# Patient Record
Sex: Female | Born: 1970
Health system: Southern US, Community
[De-identification: ages and names within clinical notes are randomized; demographics above are authoritative.]

## PROBLEM LIST (undated history)

## (undated) DIAGNOSIS — Z780 Asymptomatic menopausal state: Secondary | ICD-10-CM

## (undated) DIAGNOSIS — G2581 Restless legs syndrome: Secondary | ICD-10-CM

## (undated) DIAGNOSIS — L719 Rosacea, unspecified: Secondary | ICD-10-CM

## (undated) DIAGNOSIS — J4530 Mild persistent asthma, uncomplicated: Secondary | ICD-10-CM

## (undated) DIAGNOSIS — E785 Hyperlipidemia, unspecified: Secondary | ICD-10-CM

## (undated) DIAGNOSIS — G43801 Other migraine, not intractable, with status migrainosus: Secondary | ICD-10-CM

## (undated) DIAGNOSIS — M797 Fibromyalgia: Secondary | ICD-10-CM

## (undated) HISTORY — DX: Restless legs syndrome: G25.81

## (undated) HISTORY — DX: Asymptomatic menopausal state: Z78.0

## (undated) HISTORY — DX: Mild persistent asthma, uncomplicated: J45.30

## (undated) HISTORY — DX: Rosacea, unspecified: L71.9

## (undated) HISTORY — DX: Other migraine, not intractable, with status migrainosus: G43.801

## (undated) HISTORY — DX: Hyperlipidemia, unspecified: E78.5

## (undated) HISTORY — DX: Fibromyalgia: M79.7

## (undated) HISTORY — PX: BARIATRIC SURGERY: SHX1103

## (undated) HISTORY — PX: KNEE SURGERY: SHX244

---
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
## 1998-07-26 HISTORY — PX: GALLBLADDER SURGERY: SHX652

## 2007-07-27 HISTORY — PX: ABDOMINAL HYSTERECTOMY: SHX81

## 2007-07-27 HISTORY — PX: HERNIA REPAIR: SHX51

## 2008-07-26 HISTORY — PX: BREAST LUMPECTOMY: SHX2

## 2010-03-29 ENCOUNTER — Ambulatory Visit: Payer: Self-pay | Admitting: Family Medicine

## 2010-03-29 DIAGNOSIS — E785 Hyperlipidemia, unspecified: Secondary | ICD-10-CM | POA: Insufficient documentation

## 2010-03-29 LAB — CONVERTED CEMR LAB
Bilirubin Urine: NEGATIVE
Glucose, Urine, Semiquant: NEGATIVE
Ketones, urine, test strip: NEGATIVE
Nitrite: NEGATIVE
Specific Gravity, Urine: 1.015

## 2010-03-31 ENCOUNTER — Encounter: Payer: Self-pay | Admitting: Family Medicine

## 2010-04-01 ENCOUNTER — Telehealth (INDEPENDENT_AMBULATORY_CARE_PROVIDER_SITE_OTHER): Payer: Self-pay | Admitting: *Deleted

## 2010-08-25 NOTE — Progress Notes (Signed)
  Phone Note Outgoing Call Call back at [facility] Phone [phone]   Call placed by: Avel Sensor Action Taken: Phone Call Completed Summary of Call: Patient feeling better, finish medicine

## 2010-08-25 NOTE — Assessment & Plan Note (Signed)
Summary: Frequent, painful urination x 3dys rm 3   Vital Signs:  Patient Profile:   40 Years Old Female CC:      Frequent, painful urination x 3dys Height:     60 inches Weight:      185 pounds O2 Sat:      100 % O2 treatment:    Room Air Temp:     99.0 degrees F oral Pulse rate:   101 / minute Pulse rhythm:   irregular Resp:     16 per minute BP sitting:   124 / 82  (left arm) Cuff size:   regular  Vitals Entered By: Areta Haber CMA (March 29, 2010 3:24 PM)                History of Present Illness Chief Complaint: Frequent, painful urination x 3dys History of Present Illness:  Subjective:  Patient presents complaining of UTI symptoms for 3 days.  Complains of dysuria, frequency, nocturia, hematuria,  and urgency.  No abnormal vaginal discharge.  No fever/chills/sweats.  No abdominal pain.  No flank pain.  No nausea/vomiting.  Past history includes hysterectomy and appendectomy.  Current Problems: CYSTITIS, ACUTE (ICD-595.0) FAMILY HISTORY BREAST CANCER 1ST DEGREE RELATIVE <50 (ICD-V16.3) RHEUMATOID ARTHRITIS (ICD-714.0) HYPERLIPIDEMIA (ICD-272.4) DEPRESSION (ICD-311) ANXIETY (ICD-300.00)   Current Meds WELLBUTRIN XL 300 MG XR24H-TAB (BUPROPION HCL) 1 tab by mouth once daily SINGULAIR 10 MG TABS (MONTELUKAST SODIUM) 1 tab by mouth once daily CYMBALTA 60 MG CPEP (DULOXETINE HCL) 1 tab by mouth once daily PRAVACHOL 80 MG TABS (PRAVASTATIN SODIUM) 1 tab by mouth once daily ZYRTEC HIVES RELIEF 10 MG TABS (CETIRIZINE HCL) 1 tab by mouth once daily CEPHALEXIN 500 MG CAPS (CEPHALEXIN) One by mouth two times a day PYRIDIUM 200 MG TABS (PHENAZOPYRIDINE HCL) 1 by mouth three times a day pc  REVIEW OF SYSTEMS Constitutional Symptoms      Denies fever, chills, night sweats, weight loss, weight gain, and fatigue.  Eyes       Denies change in vision, eye pain, eye discharge, glasses, contact lenses, and eye surgery. Ear/Nose/Throat/Mouth       Denies hearing  loss/aids, change in hearing, ear pain, ear discharge, dizziness, frequent runny nose, frequent nose bleeds, sinus problems, sore throat, hoarseness, and tooth pain or bleeding.  Respiratory       Denies dry cough, productive cough, wheezing, shortness of breath, asthma, bronchitis, and emphysema/COPD.  Cardiovascular       Denies murmurs, chest pain, and tires easily with exhertion.    Gastrointestinal       Denies stomach pain, nausea/vomiting, diarrhea, constipation, blood in bowel movements, and indigestion. Genitourniary       Complains of painful urination.      Denies kidney stones and loss of urinary control.      Comments: Frequentx 3 dys Neurological       Denies paralysis, seizures, and fainting/blackouts. Musculoskeletal       Denies muscle pain, joint pain, joint stiffness, decreased range of motion, redness, swelling, muscle weakness, and gout.  Skin       Denies bruising, unusual mles/lumps or sores, and hair/skin or nail changes.  Psych       Denies mood changes, temper/anger issues, anxiety/stress, speech problems, depression, and sleep problems.  Past History:  Past Medical History: Anxiety Depression Hyperlipidemia Fibromyalgia DDD Chronic constipation Rheumatoid arthritis  Past Surgical History: Appendectomy Caesarean section Cholecystectomy Inguinal herniorrhaphy Tonsillectomy 3- tummy laps 4 R knee   Family History: Family  History of Arthritis Family History Breast cancer 1st degree relative <50 Family History of Stroke M 1st degree relative <50 Family History of Cardiovascular disorder  Social History: Never Smoked Alcohol use-no Drug use-no Regular exercise-no Smoking Status:  never Drug Use:  no Does Patient Exercise:  no   Objective:  Appearance:  Patient appears healthy, stated age, and in no acute distress  Eyes:  Pupils are equal, round, and reactive to light and accomdation.  Extraocular movement is intact.  Conjunctivae are not  inflamed.  Mouth:  moist mucous membranes  Neck:  Supple.  No adenopathy is present.  No thyromegaly is present  Lungs:  Clear to auscultation.  Breath sounds are equal.  Heart:  Regular rate and rhythm without murmurs, rubs, or gallops.  Abdomen:  Nontender without masses or hepatosplenomegaly.  Bowel sounds are present.  No CVA or flank tenderness.  urinalysis (dipstick):   3+ blood, 3+ leuks Assessment New Problems: CYSTITIS, ACUTE (ICD-595.0) FAMILY HISTORY BREAST CANCER 1ST DEGREE RELATIVE <50 (ICD-V16.3) RHEUMATOID ARTHRITIS (ICD-714.0) HYPERLIPIDEMIA (ICD-272.4) DEPRESSION (ICD-311) ANXIETY (ICD-300.00)   Plan New Medications/Changes: PYRIDIUM 200 MG TABS (PHENAZOPYRIDINE HCL) 1 by mouth three times a day pc  #6 x 0, 03/29/2010, Donna Christen MD CEPHALEXIN 500 MG CAPS (CEPHALEXIN) One by mouth two times a day  #14 x 0, 03/29/2010, Donna Christen MD  New Orders: Urinalysis [81003-65000] T-Culture, Urine [[id number]] New Patient Level III [09811] Planning Comments:   Urine culture pending.  Begin Keflex and Pyridium.  Increase fluid intake. Follow-up with PCP if not improving.   The patient and/or caregiver has been counseled thoroughly with regard to medications prescribed including dosage, schedule, interactions, rationale for use, and possible side effects and they verbalize understanding.  Diagnoses and expected course of recovery discussed and will return if not improved as expected or if the condition worsens. Patient and/or caregiver verbalized understanding.  Prescriptions: PYRIDIUM 200 MG TABS (PHENAZOPYRIDINE HCL) 1 by mouth three times a day pc  #6 x 0   Entered and Authorized by:   Donna Christen MD   Signed by:   Donna Christen MD on 03/29/2010   Method used:   Print then Give to Patient   RxID:   [id number] CEPHALEXIN 500 MG CAPS (CEPHALEXIN) One by mouth two times a day  #14 x 0   Entered and Authorized by:   Donna Christen MD   Signed by:   Donna Christen MD on 03/29/2010   Method used:   Print then Give to Patient   RxID:   [id number]   Orders Added: 1)  Urinalysis [81003-65000] 2)  T-Culture, Urine [[id number]] 3)  New Patient Level III [99203]  Laboratory Results   Urine Tests  Date/Time Received: March 29, 2010 3:40 PM  Date/Time Reported: March 29, 2010 3:40 PM   Routine Urinalysis   Color: yellow Appearance: Hazy Glucose: negative   (Normal Range: Negative) Bilirubin: negative   (Normal Range: Negative) Ketone: negative   (Normal Range: Negative) Spec. Gravity: 1.015   (Normal Range: 1.003-1.035) Blood: moderate   (Normal Range: Negative) pH: 7.0   (Normal Range: 5.0-8.0) Protein: trace   (Normal Range: Negative) Urobilinogen: 0.2   (Normal Range: 0-1) Nitrite: negative   (Normal Range: Negative) Leukocyte Esterace: moderate   (Normal Range: Negative)

## 2013-07-26 LAB — HM PAP SMEAR: HM Pap smear: NORMAL

## 2013-12-24 LAB — HM MAMMOGRAPHY: HM MAMMO: NORMAL

## 2014-01-29 LAB — LIPID PANEL
Cholesterol: 144 mg/dL (ref 0–200)
HDL: 43 mg/dL (ref 35–70)
LDL CALC: 75 mg/dL
TRIGLYCERIDES: 132 mg/dL (ref 40–160)

## 2014-01-29 LAB — BASIC METABOLIC PANEL
Creatinine: 1.2 mg/dL — AB (ref 0.5–1.1)
Glucose: 86 mg/dL
Potassium: 4.5 mmol/L (ref 3.4–5.3)
Sodium: 140 mmol/L (ref 137–147)

## 2014-01-29 LAB — CBC AND DIFFERENTIAL
Hemoglobin: 12.9 g/dL (ref 12.0–16.0)
Platelets: 264 10*3/uL (ref 150–399)
WBC: 8.2 10^3/mL

## 2014-01-29 LAB — HEPATIC FUNCTION PANEL
ALK PHOS: 63 U/L (ref 25–125)
ALT: 33 U/L (ref 7–35)
AST: 37 U/L — AB (ref 13–35)

## 2014-01-29 LAB — CMP14+LP+1AC+CBC/D/PLT+TSH: CALCIUM: 9 mg/dL

## 2014-01-29 LAB — TSH: TSH: 1.51 u[IU]/mL (ref 0.41–5.90)

## 2014-03-26 ENCOUNTER — Ambulatory Visit (INDEPENDENT_AMBULATORY_CARE_PROVIDER_SITE_OTHER): Payer: 59 | Admitting: Family Medicine

## 2014-03-26 ENCOUNTER — Encounter: Payer: Self-pay | Admitting: Family Medicine

## 2014-03-26 VITALS — BP 118/73 | HR 96 | Ht 61.25 in | Wt 224.0 lb

## 2014-03-26 DIAGNOSIS — G4700 Insomnia, unspecified: Secondary | ICD-10-CM

## 2014-03-26 DIAGNOSIS — Z9109 Other allergy status, other than to drugs and biological substances: Secondary | ICD-10-CM | POA: Insufficient documentation

## 2014-03-26 DIAGNOSIS — G43801 Other migraine, not intractable, with status migrainosus: Secondary | ICD-10-CM

## 2014-03-26 DIAGNOSIS — IMO0001 Reserved for inherently not codable concepts without codable children: Secondary | ICD-10-CM

## 2014-03-26 DIAGNOSIS — M797 Fibromyalgia: Secondary | ICD-10-CM | POA: Insufficient documentation

## 2014-03-26 DIAGNOSIS — K7689 Other specified diseases of liver: Secondary | ICD-10-CM

## 2014-03-26 DIAGNOSIS — G2581 Restless legs syndrome: Secondary | ICD-10-CM

## 2014-03-26 DIAGNOSIS — F329 Major depressive disorder, single episode, unspecified: Secondary | ICD-10-CM

## 2014-03-26 DIAGNOSIS — E785 Hyperlipidemia, unspecified: Secondary | ICD-10-CM

## 2014-03-26 DIAGNOSIS — K760 Fatty (change of) liver, not elsewhere classified: Secondary | ICD-10-CM | POA: Insufficient documentation

## 2014-03-26 DIAGNOSIS — F3289 Other specified depressive episodes: Secondary | ICD-10-CM

## 2014-03-26 HISTORY — DX: Fibromyalgia: M79.7

## 2014-03-26 HISTORY — DX: Restless legs syndrome: G25.81

## 2014-03-26 HISTORY — DX: Other migraine, not intractable, with status migrainosus: G43.801

## 2014-03-26 MED ORDER — ROPINIROLE HCL 1 MG PO TABS: 1.0000 mg | ORAL_TABLET | Freq: Every evening | ORAL | Status: DC | PRN

## 2014-03-26 MED ORDER — MONTELUKAST SODIUM 10 MG PO TABS
10.0000 mg | ORAL_TABLET | Freq: Every day | ORAL | Status: DC
Start: 2014-03-26 — End: 2016-04-13

## 2014-03-26 MED ORDER — TRAZODONE HCL 300 MG PO TABS: 300.0000 mg | ORAL_TABLET | Freq: Every day | ORAL | Status: DC

## 2014-03-26 MED ORDER — ROSUVASTATIN CALCIUM 40 MG PO TABS: 40.0000 mg | ORAL_TABLET | Freq: Every day | ORAL | Status: DC

## 2014-03-26 MED ORDER — SUMATRIPTAN SUCCINATE 25 MG PO TABS: 25.0000 mg | ORAL_TABLET | ORAL | Status: DC | PRN

## 2014-03-26 MED ORDER — MILNACIPRAN HCL 12.5 MG PO TABS: 1.0000 | ORAL_TABLET | Freq: Two times a day (BID) | ORAL | Status: DC

## 2014-03-26 MED ORDER — ARIPIPRAZOLE 5 MG PO TABS: 5.0000 mg | ORAL_TABLET | Freq: Every day | ORAL | Status: DC

## 2014-03-26 NOTE — Progress Notes (Signed)
CC: Rachel Rich is a 43 y.o. female is here for Establish Care   Subjective: HPI:  Pleasant 43 year old here to establish care  Patient reports a history of depression described as severe which she believes was poorly controlled up until she began Abilify 5 mg on a daily basis 1-2 years ago. She states that depression is now minimal to absent on a daily basis and that she's psychologically in great shape. She's tried Wellbutrin and Cymbalta in the past without much benefit. She's also taking trazodone at bedtime to help with mild to moderate insomnia along with further support for her depression.  There have been no thoughts of wanting to harm herself or others.  Reports a history of hyperlipidemia well managed on Crestor for her report. It's been over a year since lipids were checked your liver function was checked. She has a history of a symptomatic hepatic steatosis and is under the impression that this is why her former PCP will no longer provide her with Crestor, he has deferred this to her GI physician who again has deferred back to her PCP. She denies right upper quadrant pain, icterus, and states that the myalgia she's been experiencing from fibromyalgia has been unchanged since starting on Crestor.  Reports a history of insomnia currently well-managed on nightly trazodone without any known side effects.  Reports a history of environmental allergies currently well-controlled in her opinion on Singulair.  Complains of chronic migraines that occurs a few times a month. They're described as pounding, localized in the right half of the head, nonradiating, accompanied with photophobia and nausea. She is currently experiencing one right now is out of Imitrex which usually stops the pain within minutes. She denies any change in the character severity or frequency of her migraines over the past few months.  She's tried Topamax to prevent these but they caused intolerable side effects  Complains  of restless legs that has been bothering her for matter of years. Symptoms are absent provided she take 1 dose of ropinirole at bedtime.  She describes restlessness as a constant sensation of needing to move her legs present only when resting.  Reports a history of fibromyalgia described as diffuse muscle aches that have been present for matter of years. She's tried Lyrica however caused intolerable side effects. She's tried Cymbalta but it was ineffective. She currently tells me she is using tramadol 50 mg in the morning and 100 mg in the evenings. She states she still believes that there is room for improvement with her pain is currently moderate in severity present on a daily basis worse with inactivity. Is described as diffusely throughout her body. She denies weakness or any other motor or sensory disturbances other than that described above   Review of Systems - General ROS: negative for - chills, fever, night sweats, weight gain or weight loss Ophthalmic ROS: negative for - decreased vision Psychological ROS: negative for - uncontrolled anxiety or depression ENT ROS: negative for - hearing change, nasal congestion, tinnitus or allergies Hematological and Lymphatic ROS: negative for - bleeding problems, bruising or swollen lymph nodes Breast ROS: negative Respiratory ROS: no cough, shortness of breath, or wheezing Cardiovascular ROS: no chest pain or dyspnea on exertion Gastrointestinal ROS: no abdominal pain, change in bowel habits, or black or bloody stools Genito-Urinary ROS: negative for - genital discharge, genital ulcers, incontinence or abnormal bleeding from genitals Musculoskeletal ROS: negative for - joint pain  Neurological ROS: negative for - memory loss Dermatological ROS: negative for  lumps, mole changes, rash and skin lesion changes   Past Medical History  Diagnosis Date  . Hyperlipidemia     Past Surgical History  Procedure Laterality Date  . Cesarean section   4580,9983    x2   . Hernia repair  2009  . Gallbladder surgery  2000  . Appendectomy  1979  . Tonsillectomy  1976   Family History  Problem Relation Age of Onset  . Alcoholism Father   . Cancer Mother   . Heart attack Father   . Depression Mother   . Hyperlipidemia Father   . Stroke Mother     History   Social History  . Marital Status: Married    Spouse Name: N/A    Number of Children: N/A  . Years of Education: N/A   Occupational History  . Not on file.   Social History Main Topics  . Smoking status: Never Smoker   . Smokeless tobacco: Not on file  . Alcohol Use: Not on file  . Drug Use: Yes  . Sexual Activity: Yes    Partners: Male   Other Topics Concern  . Not on file   Social History Narrative  . No narrative on file     Objective: BP 118/73  Pulse 96  Ht 5' 1.25" (1.556 m)  Wt 224 lb (101.606 kg)  BMI 41.97 kg/m2  General: Alert and Oriented, No Acute Distress HEENT: Pupils equal, round, reactive to light. Conjunctivae clear.  Moist membranes pharynx unremarkable Lungs: Clear to auscultation bilaterally, no wheezing/ronchi/rales.  Comfortable work of breathing. Good air movement. Cardiac: Regular rate and rhythm. Normal S1/S2.  No murmurs, rubs, nor gallops.   Abdomen: obese and soft Extremities: No peripheral edema.  Strong peripheral pulses.  Mental Status: No depression, anxiety, nor agitation. Skin: Warm and dry.  Assessment & Plan: Rachel Rich was seen today for establish care.  Diagnoses and associated orders for this visit:  DEPRESSION - ARIPiprazole (ABILIFY) 5 MG tablet; Take 1 tablet (5 mg total) by mouth daily.  HYPERLIPIDEMIA - rosuvastatin (CRESTOR) 40 MG tablet; Take 1 tablet (40 mg total) by mouth daily. - Lipid panel - COMPLETE METABOLIC PANEL WITH GFR  Insomnia - trazodone (DESYREL) 300 MG tablet; Take 1 tablet (300 mg total) by mouth at bedtime.  Environmental allergies - montelukast (SINGULAIR) 10 MG tablet; Take 1 tablet  (10 mg total) by mouth at bedtime.  Other migraine with status migrainosus, not intractable - SUMAtriptan (IMITREX) 25 MG tablet; Take 1 tablet (25 mg total) by mouth every 2 (two) hours as needed for migraine or headache. May repeat in 2 hours if headache persists.  RLS (restless legs syndrome) - rOPINIRole (REQUIP) 1 MG tablet; Take 1 tablet (1 mg total) by mouth at bedtime as needed.  Fibromyalgia - Milnacipran HCl (SAVELLA) 12.5 MG TABS; Take 1 tablet (12.5 mg total) by mouth 2 (two) times daily.  Nonalcoholic hepatosteatosis     depression: Controlled continue Abilify and trazodone Hyperlipidemia: Due for lipid panel and liver enzymes continue Crestor pending results Insomnia: Controlled continue trazodone Environmental allergies: Controlled continue Singulair Migraine: Uncontrolled with migraine today therefore she was given 25 mg of Phenergan and 60 mg of Toradol both IM, she has a family member who will drive her home today. She's not interested in preventive medicines at this time. Imitrex as needed the future Restless leg syndrome: Controlled continue Requip Fibromyalgia: Uncontrolled, adding Savella   with the understanding that we will titrate up as tolerated if ineffective  60  minutes spent face-to-face during visit today of which at least 50% was counseling or coordinating care regarding: 1. DEPRESSION   2. HYPERLIPIDEMIA   3. Insomnia   4. Environmental allergies   5. Other migraine with status migrainosus, not intractable   6. RLS (restless legs syndrome)   7. Fibromyalgia   8. Nonalcoholic hepatosteatosis     Return for 3-6 months Fibromyalgia Follow Up.

## 2014-03-27 MED ORDER — PROMETHAZINE HCL 25 MG PO TABS: 25.0000 mg | ORAL_TABLET | Freq: Three times a day (TID) | ORAL | Status: DC | PRN

## 2014-03-27 MED ORDER — PROMETHAZINE HCL 25 MG/ML IJ SOLN
25.0000 mg | Freq: Once | INTRAMUSCULAR | Status: AC
  Administered 2014-03-27: 25 mg via INTRAMUSCULAR

## 2014-03-27 MED ORDER — KETOROLAC TROMETHAMINE 60 MG/2ML IM SOLN
60.0000 mg | Freq: Once | INTRAMUSCULAR | Status: AC
  Administered 2014-03-27: 60 mg via INTRAMUSCULAR

## 2014-03-27 NOTE — Addendum Note (Signed)
Addended by: Terance Hart on: 03/27/2014 09:21 AM   Modules accepted: Orders

## 2014-03-29 ENCOUNTER — Encounter: Payer: Self-pay | Admitting: Family Medicine

## 2014-03-29 DIAGNOSIS — G4700 Insomnia, unspecified: Secondary | ICD-10-CM

## 2014-03-29 MED ORDER — TRAZODONE HCL 100 MG PO TABS: 300.0000 mg | ORAL_TABLET | Freq: Every day | ORAL | Status: DC

## 2014-04-04 ENCOUNTER — Encounter: Payer: Self-pay | Admitting: Family Medicine

## 2014-04-04 LAB — COMPLETE METABOLIC PANEL WITH GFR
ALT: 45 U/L — AB (ref 0–35)
AST: 34 U/L (ref 0–37)
Albumin: 4.6 g/dL (ref 3.5–5.2)
Alkaline Phosphatase: 74 U/L (ref 39–117)
BUN: 9 mg/dL (ref 6–23)
CO2: 27 mEq/L (ref 19–32)
CREATININE: 0.92 mg/dL (ref 0.50–1.10)
Calcium: 9.6 mg/dL (ref 8.4–10.5)
Chloride: 104 mEq/L (ref 96–112)
GFR, EST AFRICAN AMERICAN: 88 mL/min
GFR, Est Non African American: 77 mL/min
Glucose, Bld: 97 mg/dL (ref 70–99)
Potassium: 4.3 mEq/L (ref 3.5–5.3)
Sodium: 142 mEq/L (ref 135–145)
Total Bilirubin: 0.9 mg/dL (ref 0.2–1.2)
Total Protein: 7.2 g/dL (ref 6.0–8.3)

## 2014-04-04 LAB — LIPID PANEL
Cholesterol: 150 mg/dL (ref 0–200)
HDL: 39 mg/dL — AB (ref >39)
LDL CALC: 75 mg/dL (ref 0–99)
TRIGLYCERIDES: 181 mg/dL — AB (ref <150)
Total CHOL/HDL Ratio: 3.8 Ratio
VLDL: 36 mg/dL (ref 0–40)

## 2014-05-23 MED ORDER — TRAZODONE HCL 100 MG PO TABS: 300.0000 mg | ORAL_TABLET | Freq: Every day | ORAL | Status: DC

## 2014-05-23 NOTE — Addendum Note (Signed)
Addended by: Beatrice Lecher D on: 05/23/2014 05:28 PM   Modules accepted: Orders

## 2014-07-25 ENCOUNTER — Encounter: Payer: Self-pay | Admitting: Family Medicine

## 2014-07-25 DIAGNOSIS — F319 Bipolar disorder, unspecified: Secondary | ICD-10-CM | POA: Insufficient documentation

## 2014-07-25 DIAGNOSIS — L719 Rosacea, unspecified: Secondary | ICD-10-CM

## 2014-07-25 DIAGNOSIS — J4530 Mild persistent asthma, uncomplicated: Secondary | ICD-10-CM

## 2014-07-25 DIAGNOSIS — D242 Benign neoplasm of left breast: Secondary | ICD-10-CM | POA: Insufficient documentation

## 2014-07-25 DIAGNOSIS — Z780 Asymptomatic menopausal state: Secondary | ICD-10-CM | POA: Insufficient documentation

## 2014-07-25 DIAGNOSIS — B009 Herpesviral infection, unspecified: Secondary | ICD-10-CM | POA: Insufficient documentation

## 2014-07-25 DIAGNOSIS — D271 Benign neoplasm of left ovary: Secondary | ICD-10-CM | POA: Insufficient documentation

## 2014-07-25 HISTORY — DX: Mild persistent asthma, uncomplicated: J45.30

## 2014-07-25 HISTORY — DX: Asymptomatic menopausal state: Z78.0

## 2014-07-25 HISTORY — DX: Rosacea, unspecified: L71.9

## 2014-08-01 ENCOUNTER — Encounter: Payer: Self-pay | Admitting: Family Medicine

## 2014-09-24 ENCOUNTER — Other Ambulatory Visit: Payer: Self-pay

## 2014-09-24 ENCOUNTER — Encounter: Payer: Self-pay | Admitting: Family Medicine

## 2014-09-24 DIAGNOSIS — F32A Depression, unspecified: Secondary | ICD-10-CM

## 2014-09-24 DIAGNOSIS — F329 Major depressive disorder, single episode, unspecified: Secondary | ICD-10-CM

## 2014-09-24 MED ORDER — ARIPIPRAZOLE 5 MG PO TABS: 5.0000 mg | ORAL_TABLET | Freq: Every day | ORAL | Status: DC

## 2014-10-07 ENCOUNTER — Encounter: Payer: Self-pay | Admitting: Family Medicine

## 2014-10-25 ENCOUNTER — Encounter: Payer: Self-pay | Admitting: Family Medicine

## 2014-10-25 ENCOUNTER — Ambulatory Visit (INDEPENDENT_AMBULATORY_CARE_PROVIDER_SITE_OTHER): Payer: 59 | Admitting: Family Medicine

## 2014-10-25 VITALS — BP 124/78 | HR 81 | Wt 236.0 lb

## 2014-10-25 DIAGNOSIS — E785 Hyperlipidemia, unspecified: Secondary | ICD-10-CM

## 2014-10-25 DIAGNOSIS — G4700 Insomnia, unspecified: Secondary | ICD-10-CM

## 2014-10-25 DIAGNOSIS — F3130 Bipolar disorder, current episode depressed, mild or moderate severity, unspecified: Secondary | ICD-10-CM | POA: Diagnosis not present

## 2014-10-25 DIAGNOSIS — F32A Depression, unspecified: Secondary | ICD-10-CM

## 2014-10-25 DIAGNOSIS — E669 Obesity, unspecified: Secondary | ICD-10-CM

## 2014-10-25 DIAGNOSIS — F329 Major depressive disorder, single episode, unspecified: Secondary | ICD-10-CM | POA: Diagnosis not present

## 2014-10-25 MED ORDER — ARIPIPRAZOLE 5 MG PO TABS: 5.0000 mg | ORAL_TABLET | Freq: Every day | ORAL | Status: DC

## 2014-10-25 MED ORDER — PHENTERMINE HCL 37.5 MG PO TABS: 37.5000 mg | ORAL_TABLET | Freq: Every day | ORAL | Status: DC

## 2014-10-25 NOTE — Patient Instructions (Signed)
Sleep Aid: Unisom  Weight Loss Medications: Belviq, Contrave, Qsymia

## 2014-10-25 NOTE — Progress Notes (Signed)
CC: Rachel Rich is a 44 y.o. female is here for Medication Refill   Subjective: HPI:  Follow-up hyperlipidemia: Since I saw her last she began taking a gram of fish oil twice a day. She continues to take Crestor daily without right upper quadrant pain or myalgias. No chest pain motor or sensory disturbances nor limb claudication.  Follow-up depression and bipolar disorder: Has been taking Abilify on a daily basis without any known side effects. She's been taking this for well over a few years now and states that it's doing a fantastic job at the limiting her depression. There's been no manic activity or mental disturbance since I saw her last. No thoughts of wanting to harm herself or others  Complains of difficulty staying asleep despite using trazodone every evening. She'll wake up at 3 in the morning and will beunable to fall back asleep for another 1 or 2 hours. This happens most nights of the week and has been going on for a few months now. Symptoms are moderate in severity. Nothing seems to make them better or worse. She wants any over-the-counter medication that she can take other than melatonin that has been ineffective in the past  She would like to know if there is a medication that can help her with weight loss. She tells me that she is not interested in ever pursuing surgical interventions. She is trying to lose weight with portion control but this seems to be the largest hurdle for her weight loss  Review Of Systems Outlined In HPI  Past Medical History  Diagnosis Date  . Hyperlipidemia     Past Surgical History  Procedure Laterality Date  . Cesarean section  5277,8242    x2   . Hernia repair  2009  . Gallbladder surgery  2000  . Appendectomy  1979  . Tonsillectomy  1976   Family History  Problem Relation Age of Onset  . Alcoholism Father   . Cancer Mother   . Heart attack Father   . Depression Mother   . Hyperlipidemia Father   . Stroke Mother     History    Social History  . Marital Status: Married    Spouse Name: N/A  . Number of Children: N/A  . Years of Education: N/A   Occupational History  . Not on file.   Social History Main Topics  . Smoking status: Never Smoker   . Smokeless tobacco: Not on file  . Alcohol Use: Not on file  . Drug Use: Yes  . Sexual Activity:    Partners: Male   Other Topics Concern  . Not on file   Social History Narrative     Objective: BP 124/78 mmHg  Pulse 81  Wt 236 lb (107.049 kg)  SpO2 97%  Vital signs reviewed. General: Alert and Oriented, No Acute Distress HEENT: Pupils equal, round, reactive to light. Conjunctivae clear.  External ears unremarkable.  Moist mucous membranes. Lungs: Clear and comfortable work of breathing, speaking in full sentences without accessory muscle use. Cardiac: Regular rate and rhythm.  Neuro: CN II-XII grossly intact, gait normal. Extremities: No peripheral edema.  Strong peripheral pulses.  Mental Status: No depression, anxiety, nor agitation. Logical though process. Skin: Warm and dry.  Assessment & Plan: Rachel Rich was seen today for medication refill.  Diagnoses and all orders for this visit:  Hyperlipidemia Orders: -     Lipid panel  Bipolar disorder, most recent episode depressed, remission status unspecified  Depressive disorder Orders: -  ARIPiprazole (ABILIFY) 5 MG tablet; Take 1 tablet (5 mg total) by mouth daily.  Obesity  Insomnia  Other orders -     Cancel: Lipid panel -     phentermine (ADIPEX-P) 37.5 MG tablet; Take 1 tablet (37.5 mg total) by mouth daily before breakfast.   Hyperlipidemia: Due for repeat lipid panel to check triglycerides Bipolar disorder: Controlled on Abilify refills provided Obesity: Uncontrolled discussed phentermine, and other pharmaceutical options. Joint decision to start phentermine and then when she hits a plateau with weight loss switching to one of the newer weight loss medication  agents. Insomnia: Advised to start Unisom nightly. Continue trazodone  Return in about 6 months (around 04/26/2015).

## 2014-11-19 ENCOUNTER — Ambulatory Visit: Payer: 59

## 2014-11-20 ENCOUNTER — Ambulatory Visit (INDEPENDENT_AMBULATORY_CARE_PROVIDER_SITE_OTHER): Payer: 59 | Admitting: Family Medicine

## 2014-11-20 VITALS — BP 122/85 | HR 82 | Wt 226.0 lb

## 2014-11-20 DIAGNOSIS — R635 Abnormal weight gain: Secondary | ICD-10-CM

## 2014-11-20 MED ORDER — PHENTERMINE HCL 37.5 MG PO TABS: 37.5000 mg | ORAL_TABLET | Freq: Every day | ORAL | Status: DC

## 2014-11-20 NOTE — Progress Notes (Signed)
Continued weight loss, refill provided

## 2014-11-20 NOTE — Progress Notes (Signed)
Patient ID: Rachel Rich, female   DOB: 1971-02-20, 44 y.o.   MRN: [id number]  Patient here today for weight check. Patient is doing well. Weight loss of 10 lbs. Patient is very pleased & says everything is going well. Next appointment  in 4 weeks.

## 2014-12-12 ENCOUNTER — Ambulatory Visit: Payer: 59

## 2014-12-19 ENCOUNTER — Ambulatory Visit (INDEPENDENT_AMBULATORY_CARE_PROVIDER_SITE_OTHER): Payer: 59 | Admitting: Sports Medicine

## 2014-12-19 VITALS — BP 123/81 | HR 75 | Wt 223.0 lb

## 2014-12-19 DIAGNOSIS — G43801 Other migraine, not intractable, with status migrainosus: Secondary | ICD-10-CM | POA: Diagnosis not present

## 2014-12-19 MED ORDER — PHENTERMINE HCL 37.5 MG PO TABS: 37.5000 mg | ORAL_TABLET | Freq: Every day | ORAL | Status: DC

## 2014-12-19 MED ORDER — PROMETHAZINE HCL 25 MG PO TABS
25.0000 mg | ORAL_TABLET | Freq: Three times a day (TID) | ORAL | Status: DC | PRN
Start: 2014-12-19 — End: 2016-01-28

## 2014-12-19 NOTE — Progress Notes (Signed)
   Subjective:    Patient ID: Rachel Rich, female    DOB: 06-04-1971, 44 y.o.   MRN: [id number]  HPI  Patient is here for blood pressure and weight check. Denies any trouble sleeping, palpitations, or any other medication problems. Patient states she has been going to the gym three times a week and has gown down one dress size.   Review of Systems     Objective:   Physical Exam        Assessment & Plan:  Patient has lost some weight. A refill for Phentermine will be sent to patient preferred pharmacy. Patient advised to schedule a four week nurse visit and keep her upcoming appointment with her PCP. Verbalized understanding, no further questions.

## 2015-01-16 ENCOUNTER — Ambulatory Visit (INDEPENDENT_AMBULATORY_CARE_PROVIDER_SITE_OTHER): Payer: 59 | Admitting: Sports Medicine

## 2015-01-16 VITALS — BP 106/73 | HR 83 | Wt 224.0 lb

## 2015-01-16 DIAGNOSIS — G43801 Other migraine, not intractable, with status migrainosus: Secondary | ICD-10-CM

## 2015-01-16 DIAGNOSIS — R635 Abnormal weight gain: Secondary | ICD-10-CM

## 2015-01-16 MED ORDER — TOPIRAMATE 25 MG PO TABS: ORAL_TABLET | ORAL | Status: DC

## 2015-01-16 MED ORDER — PHENTERMINE HCL 37.5 MG PO TABS: ORAL_TABLET | ORAL | Status: DC

## 2015-01-16 NOTE — Assessment & Plan Note (Addendum)
Gained a single pound on phentermine, I am going to provide an additional refill of phentermine as we enter the fourth month, however we are going to add Topamax and a nutritionist referral. I'm going to switch phentermine to one half tab twice a day to provide a more even concentration through the day. She does have a listed intolerance to Topamax so we will start at a very low dose, 12.5 mg for a week then 25 mg daily. If she does not lose weight by the next month we should probably consider medications such as Saxenda or Victoza. Topamax will also help her migraines.

## 2015-01-16 NOTE — Progress Notes (Signed)
   Subjective:    Patient ID: Rachel Rich, female    DOB: 03/25/1971, 44 y.o.   MRN: [id number]  HPI  Patient is here for blood pressure and weight check. Denies any trouble sleeping, palpitations, or any other medication problems. Patient states she went on vacation for 1 week and "did not take her Rx the entire trip," also she had [place] food for lunch so she "is retaining water from all the sodium."  Review of Systems     Objective:   Physical Exam        Assessment & Plan:  Patient has lost not weight. A refill for Phentermine will be sent to provider for review. Patient advised since her weight was up 1 pound from last visit that refill may not be written. Verbalized understanding, no further questions.

## 2015-01-16 NOTE — Progress Notes (Signed)
Rx faxed, Physician spoke with Pt regarding Rx.

## 2015-01-29 ENCOUNTER — Telehealth: Payer: Self-pay

## 2015-01-29 DIAGNOSIS — E785 Hyperlipidemia, unspecified: Secondary | ICD-10-CM

## 2015-01-29 NOTE — Telephone Encounter (Signed)
Ordered Nutrition referral.

## 2015-02-03 ENCOUNTER — Telehealth: Payer: Self-pay

## 2015-02-03 NOTE — Telephone Encounter (Signed)
Since starting the Topamax she has noticed tingling in hands, feet, arms and legs. Also has increased urination, dry mouth and swelling in legs. Please advise.

## 2015-02-03 NOTE — Telephone Encounter (Signed)
These are common side effects with Topamax, they typically resolve, push through the symptoms for an additional week or so.

## 2015-02-03 NOTE — Telephone Encounter (Signed)
Patient advised of recommendations.  

## 2015-02-20 ENCOUNTER — Ambulatory Visit (INDEPENDENT_AMBULATORY_CARE_PROVIDER_SITE_OTHER): Payer: 59 | Admitting: Family Medicine

## 2015-02-20 VITALS — BP 116/78 | Ht 61.25 in | Wt 218.0 lb

## 2015-02-20 DIAGNOSIS — R635 Abnormal weight gain: Secondary | ICD-10-CM

## 2015-02-20 MED ORDER — PHENTERMINE HCL 37.5 MG PO TABS: ORAL_TABLET | ORAL | Status: DC

## 2015-02-20 MED ORDER — TOPIRAMATE 25 MG PO TABS: ORAL_TABLET | ORAL | Status: DC

## 2015-02-20 NOTE — Progress Notes (Signed)
Agree with note. 

## 2015-02-20 NOTE — Progress Notes (Signed)
   Subjective:    Patient ID: Rachel Rich, female    DOB: 10-Dec-1970, 44 y.o.   MRN: [id number]  HPI Patient is here today for blood pressure and weight check. Denies trouble sleeping, palpitations, or medication problems.    Review of Systems     Objective:   Physical Exam        Assessment & Plan:  Patient has lost weight. A refill for phentermine and topamax. Patient advised to make a follow up appointment in 30 days.

## 2015-03-21 ENCOUNTER — Ambulatory Visit (INDEPENDENT_AMBULATORY_CARE_PROVIDER_SITE_OTHER): Payer: 59 | Admitting: Family Medicine

## 2015-03-21 VITALS — BP 118/69 | HR 80 | Wt 220.0 lb

## 2015-03-21 DIAGNOSIS — R635 Abnormal weight gain: Secondary | ICD-10-CM

## 2015-03-21 NOTE — Progress Notes (Signed)
   Subjective:    Patient ID: Rachel Rich, female    DOB: 08/28/1970, 44 y.o.   MRN: [id number]  HPI  Rachel Rich is here for blood pressure and weight check. Denies palpitations or problems sleeping.   Review of Systems     Objective:   Physical Exam        Assessment & Plan:  Patient has not lost weight.

## 2015-03-21 NOTE — Progress Notes (Signed)
Rachel Rich, Will you please let patient know that phentermine has lost it's effect on helping her lose weight.  If she's interested in trying other weight loss medications please f/u and I'll go over some other options.

## 2015-04-29 ENCOUNTER — Ambulatory Visit (INDEPENDENT_AMBULATORY_CARE_PROVIDER_SITE_OTHER): Payer: 59 | Admitting: Family Medicine

## 2015-04-29 ENCOUNTER — Encounter: Payer: Self-pay | Admitting: Family Medicine

## 2015-04-29 VITALS — BP 132/68 | HR 72 | Ht 61.25 in | Wt 225.0 lb

## 2015-04-29 DIAGNOSIS — R635 Abnormal weight gain: Secondary | ICD-10-CM

## 2015-04-29 DIAGNOSIS — Z0000 Encounter for general adult medical examination without abnormal findings: Secondary | ICD-10-CM | POA: Diagnosis not present

## 2015-04-29 MED ORDER — LIRAGLUTIDE -WEIGHT MANAGEMENT 18 MG/3ML ~~LOC~~ SOPN: 0.6000 mg | PEN_INJECTOR | Freq: Every day | SUBCUTANEOUS | Status: DC

## 2015-04-29 MED ORDER — TOPIRAMATE 25 MG PO TABS: ORAL_TABLET | ORAL | Status: DC

## 2015-04-29 MED ORDER — INSULIN PEN NEEDLE 29G X 12.7MM MISC: Status: DC

## 2015-04-29 NOTE — Progress Notes (Signed)
CC: Breindy Meadow is a 44 y.o. female is here for Annual Exam   Subjective: HPI:  Colonoscopy: no current indication  Papsmear: Normal 07/26/13 per patient, repeat needed 2018 Mammogram: Normal 10/12/14 with [facility], repeat 2017  Influenza Vaccine: received today Pneumovax:no current indication Td/Tdap:recieved today Zoster: (Start 45 yo)  Difficulty losing weight due to not able to go to the gym secondary to fibromyalgia symptoms. She is having trouble with portion control and calorie reduction  Review of Systems - General ROS: negative for - chills, fever, night sweats, weight loss Ophthalmic ROS: negative for - decreased vision Psychological ROS: negative for - anxiety or depression ENT ROS: negative for - hearing change, nasal congestion, tinnitus or allergies Hematological and Lymphatic ROS: negative for - bleeding problems, bruising or swollen lymph nodes Breast ROS: negative Respiratory ROS: no cough, shortness of breath, or wheezing Cardiovascular ROS: no chest pain or dyspnea on exertion Gastrointestinal ROS: no abdominal pain, change in bowel habits, or black or bloody stools Genito-Urinary ROS: negative for - genital discharge, genital ulcers, incontinence or abnormal bleeding from genitals Musculoskeletal ROS: negative for - joint pain or muscle pain Neurological ROS: negative for - headaches or memory loss Dermatological ROS: negative for lumps, mole changes, rash and skin lesion changes   Past Medical History  Diagnosis Date  . Hyperlipidemia     Past Surgical History  Procedure Laterality Date  . Cesarean section  [id number]    x2   . Hernia repair  2009  . Gallbladder surgery  2000  . Appendectomy  1979  . Tonsillectomy  1976   Family History  Problem Relation Age of Onset  . Alcoholism Father   . Cancer Mother   . Heart attack Father   . Depression Mother   . Hyperlipidemia Father   . Stroke Mother     Social History   Social History  . Marital  Status: Married    Spouse Name: N/A  . Number of Children: N/A  . Years of Education: N/A   Occupational History  . Not on file.   Social History Main Topics  . Smoking status: Never Smoker   . Smokeless tobacco: Not on file  . Alcohol Use: Not on file  . Drug Use: Yes  . Sexual Activity:    Partners: Male   Other Topics Concern  . Not on file   Social History Narrative     Objective: BP 132/68 mmHg  Pulse 72  Ht 5' 1.25" (1.556 m)  Wt 225 lb (102.059 kg)  BMI 42.15 kg/m2  General: No Acute Distress HEENT: Atraumatic, normocephalic, conjunctivae normal without scleral icterus.  No nasal discharge, hearing grossly intact, TMs with good landmarks bilaterally with no middle ear abnormalities, posterior pharynx clear without oral lesions. Neck: Supple, trachea midline, no cervical nor supraclavicular adenopathy. Pulmonary: Clear to auscultation bilaterally without wheezing, rhonchi, nor rales. Cardiac: Regular rate and rhythm.  No murmurs, rubs, nor gallops. No peripheral edema.  2+ peripheral pulses bilaterally. Abdomen: Bowel sounds normal.  No masses.  Non-tender without rebound.  Negative Murphy's sign. MSK: Grossly intact, no signs of weakness.  Full strength throughout upper and lower extremities.  Full ROM in upper and lower extremities.  No midline spinal tenderness. Neuro: Gait unremarkable, CN II-XII grossly intact.  C5-C6 Reflex 2/4 Bilaterally, L4 Reflex 2/4 Bilaterally.  Cerebellar function intact. Skin: No rashes. Small slightly inflamed skin tags on the neck Psych: Alert and oriented to person/place/time.  Thought process normal. No anxiety/depression.  Assessment &  Plan: Landis was seen today for annual exam.  Diagnoses and all orders for this visit:  Annual physical exam -     Lipid panel -     COMPLETE METABOLIC PANEL WITH GFR -     CBC -     TSH -     T4, free  Abnormal weight gain -     Discontinue: topiramate (TOPAMAX) 25 MG tablet; One half tab  by mouth twice a day for one week then one tab by mouth daily. -     topiramate (TOPAMAX) 25 MG tablet; One half tab by mouth twice a day for one week then one tab by mouth daily.  Other orders -     Liraglutide -Weight Management (SAXENDA) 18 MG/3ML SOPN; Inject 0.6 mg into the skin daily. Increase by 0.6mg  every week as tolerated until 3mg  daily is reached. -     Insulin Pen Needle (BD ULTRA-FINE PEN NEEDLES) 29G X 12.7MM MISC; Use to inject saxenda daily.   Healthy lifestyle interventions including but not limited to regular exercise, a healthy low fat diet, moderation of salt intake, the dangers of tobacco/alcohol/recreational drug use, nutrition supplementation, and accident avoidance were discussed with the patient and a handout was provided for future reference. That with her weight loss we will see if she can afford saxenda. Savings voucher provided. Rule out hypothyroidism as well. Restarting Topamax since this was helpful without side effects.    Return in about 3 months (around 07/30/2015).

## 2015-04-30 ENCOUNTER — Encounter: Payer: 59 | Admitting: Family Medicine

## 2015-04-30 ENCOUNTER — Encounter: Payer: Self-pay | Admitting: Family Medicine

## 2015-05-01 LAB — COMPLETE METABOLIC PANEL WITH GFR
ALBUMIN: 4.1 g/dL (ref 3.6–5.1)
ALK PHOS: 69 U/L (ref 33–115)
ALT: 44 U/L — ABNORMAL HIGH (ref 6–29)
AST: 31 U/L (ref 10–35)
BILIRUBIN TOTAL: 0.7 mg/dL (ref 0.2–1.2)
BUN: 12 mg/dL (ref 7–25)
CO2: 25 mmol/L (ref 20–31)
Calcium: 8.8 mg/dL (ref 8.6–10.2)
Chloride: 106 mmol/L (ref 98–110)
Creat: 0.95 mg/dL (ref 0.50–1.10)
GFR, EST NON AFRICAN AMERICAN: 73 mL/min (ref >=60)
GFR, Est African American: 84 mL/min (ref >=60)
Glucose, Bld: 105 mg/dL — ABNORMAL HIGH (ref 65–99)
POTASSIUM: 4.3 mmol/L (ref 3.5–5.3)
Sodium: 137 mmol/L (ref 135–146)
Total Protein: 6.5 g/dL (ref 6.1–8.1)

## 2015-05-01 LAB — LIPID PANEL
CHOL/HDL RATIO: 3.2 ratio (ref <=5.0)
Cholesterol: 155 mg/dL (ref 125–200)
HDL: 48 mg/dL (ref >=46)
LDL Cholesterol: 73 mg/dL (ref <130)
TRIGLYCERIDES: 168 mg/dL — AB (ref <150)
VLDL: 34 mg/dL — ABNORMAL HIGH (ref <30)

## 2015-05-01 LAB — CBC
HCT: 40.6 % (ref 36.0–46.0)
HEMOGLOBIN: 13.1 g/dL (ref 12.0–15.0)
MCH: 28.2 pg (ref 26.0–34.0)
MCHC: 32.3 g/dL (ref 30.0–36.0)
MCV: 87.3 fL (ref 78.0–100.0)
MPV: 10.8 fL (ref 8.6–12.4)
Platelets: 208 10*3/uL (ref 150–400)
RBC: 4.65 MIL/uL (ref 3.87–5.11)
RDW: 13.2 % (ref 11.5–15.5)
WBC: 5.7 10*3/uL (ref 4.0–10.5)

## 2015-05-01 LAB — T4, FREE: Free T4: 1.03 ng/dL (ref 0.80–1.80)

## 2015-05-01 LAB — TSH: TSH: 1.737 u[IU]/mL (ref 0.350–4.500)

## 2015-05-11 ENCOUNTER — Telehealth: Payer: Self-pay | Admitting: Family Medicine

## 2015-05-11 NOTE — Telephone Encounter (Signed)
Seth Bake, Will you please let patient know that her thyroid function, blood cell counts, and kidney function were all normal.  Her triglyceride level was slightly elevated and appears to be causing some mild liver inflammation.  Both of these findings are influenced by taking Abilify and both of these can usually be improved with taking a fish oil supplement.  Can she please let me know if she's taking a supplement and what dose she is currently taking?

## 2015-05-12 MED ORDER — EQL FISH OIL 1000 MG PO CAPS: ORAL_CAPSULE | ORAL | Status: DC

## 2015-05-12 NOTE — Telephone Encounter (Signed)
Ok then I would recommend she start taking 1000mg  of OTC fish oil and return in one month to recheck liver enzymes.

## 2015-05-12 NOTE — Telephone Encounter (Signed)
Pt.notified

## 2015-05-12 NOTE — Telephone Encounter (Signed)
Pt notified and she is not taking fish oil currently

## 2015-07-30 ENCOUNTER — Ambulatory Visit: Payer: 59 | Admitting: Family Medicine

## 2015-08-05 ENCOUNTER — Encounter: Payer: Self-pay | Admitting: Family Medicine

## 2015-08-05 ENCOUNTER — Ambulatory Visit (INDEPENDENT_AMBULATORY_CARE_PROVIDER_SITE_OTHER): Payer: 59 | Admitting: Family Medicine

## 2015-08-05 VITALS — BP 116/74 | HR 86 | Wt 228.0 lb

## 2015-08-05 DIAGNOSIS — F32A Depression, unspecified: Secondary | ICD-10-CM

## 2015-08-05 DIAGNOSIS — Z9109 Other allergy status, other than to drugs and biological substances: Secondary | ICD-10-CM

## 2015-08-05 DIAGNOSIS — F329 Major depressive disorder, single episode, unspecified: Secondary | ICD-10-CM | POA: Diagnosis not present

## 2015-08-05 DIAGNOSIS — K760 Fatty (change of) liver, not elsewhere classified: Secondary | ICD-10-CM | POA: Diagnosis not present

## 2015-08-05 DIAGNOSIS — G4700 Insomnia, unspecified: Secondary | ICD-10-CM | POA: Diagnosis not present

## 2015-08-05 DIAGNOSIS — R079 Chest pain, unspecified: Secondary | ICD-10-CM

## 2015-08-05 DIAGNOSIS — G2581 Restless legs syndrome: Secondary | ICD-10-CM

## 2015-08-05 DIAGNOSIS — R55 Syncope and collapse: Secondary | ICD-10-CM

## 2015-08-05 DIAGNOSIS — Z91048 Other nonmedicinal substance allergy status: Secondary | ICD-10-CM

## 2015-08-05 DIAGNOSIS — E785 Hyperlipidemia, unspecified: Secondary | ICD-10-CM

## 2015-08-05 MED ORDER — ARIPIPRAZOLE 5 MG PO TABS: 5.0000 mg | ORAL_TABLET | Freq: Every day | ORAL | Status: DC

## 2015-08-05 NOTE — Progress Notes (Signed)
CC: Rachel Rich is a 45 y.o. female is here for Follow-up; Chest Pain; and Shortness of Breath   Subjective: HPI:  Follow-up nonalcoholic hepatic steatosis: She started taking flaxseed because she was not able to tolerate fish oil. She denies  Any right upper quadrant pain or jaundice.  Follow depression: She is requesting a refill on Abilify.she would also like to know if she can cut back on trazodone to see if she actually needs to be on so much medication for  insomnia and depression. She denies any active depression or anxiety nor any other mental disturbance.  Follow-up environmental allergies: Provided she takes similar on a daily basis she denies any nasal congestion, cough, wheezing or sneezing  Follow-up hyperlipidemia: She is requesting a refill on Crestor with no right upper quadrant pain or myalgias.  She is requesting a refill for Requip which has reduced her restless leg syndrome to a degree that does not interfere with quality of life  She tells me that 3 weeks ago she was having some chest pain while sitting at her desk and then all of a sudden passed out. Her coworkers estimate that she was out for 30 minutes and had a pulse and was breathing the whole time but was not arousable to sternal rub. Paramedics came  Recommended that she go to a local hospital for further evaluation however she declined feeling that this was inappropriate. She had some nausea when she came to but no longer had chest pain. She gets shortness of breath when climbing stairs but denies any other shortness of breath or episodes of chest pain. She is a strong family history of heart attacks with her sister having a heart attack in her late 17s.   Review Of Systems Outlined In HPI  Past Medical History  Diagnosis Date  . Hyperlipidemia     Past Surgical History  Procedure Laterality Date  . Cesarean section  [id number]    x2   . Hernia repair  2009  . Gallbladder surgery  2000  . Appendectomy   1979  . Tonsillectomy  1976   Family History  Problem Relation Age of Onset  . Alcoholism Father   . Cancer Mother   . Heart attack Father   . Depression Mother   . Hyperlipidemia Father   . Stroke Mother     Social History   Social History  . Marital Status: Married    Spouse Name: N/A  . Number of Children: N/A  . Years of Education: N/A   Occupational History  . Not on file.   Social History Main Topics  . Smoking status: Never Smoker   . Smokeless tobacco: Not on file  . Alcohol Use: Not on file  . Drug Use: Yes  . Sexual Activity:    Partners: Male   Other Topics Concern  . Not on file   Social History Narrative     Objective: BP 116/74 mmHg  Pulse 86  Wt 228 lb (103.42 kg)  SpO2 98%  General: Alert and Oriented, No Acute Distress HEENT: Pupils equal, round, reactive to light. Conjunctivae clear.  Moist mucous membranes Lungs: Clear to auscultation bilaterally, no wheezing/ronchi/rales.  Comfortable work of breathing. Good air movement. Cardiac: Regular rate and rhythm. Normal S1/S2.  No murmurs, rubs, nor gallops.   Extremities: No peripheral edema.  Strong peripheral pulses.  Mental Status: No depression, anxiety, nor agitation. Skin: Warm and dry.  Assessment & Plan: Rachel Rich was seen today for follow-up, chest pain  and shortness of breath.  Diagnoses and all orders for this visit:  Nonalcoholic hepatosteatosis -     Hepatic function panel  Depressive disorder -     ARIPiprazole (ABILIFY) 5 MG tablet; Take 1 tablet (5 mg total) by mouth daily.  Environmental allergies  Insomnia  Hyperlipidemia  Chest pain, unspecified chest pain type -     Ambulatory referral to Cardiology  Syncope, unspecified syncope type -     Ambulatory referral to Cardiology  RLS (restless legs syndrome)   NASH: rechecking lipid panel today. Continue flaxseed Depression: Controlled with Abilify. Environmental allergens: Controlled with Singulair Insomnia:  Controlled trial of cutting back on trazodone to only 200 mg. Hyperlipidemia: Controlled with Crestor Restless leg syndrome: Controlled with ropinirole Chest pain with syncope: Pretty concerned she could've had a cardiac event. In the send her to Dr. Mauricio Po to see if he thinks she would benefit from a stress test versus event monitor.  She is going to let me know once she knows her mail order pharmacy so that I can send in refills for Crestor, Requip, Singulair and trazodone.  Return in about 6 months (around 02/02/2016).

## 2015-08-06 ENCOUNTER — Telehealth: Payer: Self-pay | Admitting: Family Medicine

## 2015-08-06 LAB — HEPATIC FUNCTION PANEL
ALBUMIN: 4.7 g/dL (ref 3.6–5.1)
ALT: 81 U/L — AB (ref 6–29)
AST: 54 U/L — ABNORMAL HIGH (ref 10–35)
Alkaline Phosphatase: 71 U/L (ref 33–115)
BILIRUBIN INDIRECT: 0.5 mg/dL (ref 0.2–1.2)
Bilirubin, Direct: 0.2 mg/dL (ref <=0.2)
TOTAL PROTEIN: 7.1 g/dL (ref 6.1–8.1)
Total Bilirubin: 0.7 mg/dL (ref 0.2–1.2)

## 2015-08-06 MED ORDER — VITAMIN E 180 MG (400 UNIT) PO CAPS: 400.0000 [IU] | ORAL_CAPSULE | Freq: Two times a day (BID) | ORAL | Status: DC

## 2015-08-06 NOTE — Telephone Encounter (Signed)
vm left for pt to return call

## 2015-08-06 NOTE — Telephone Encounter (Signed)
Pt.notified

## 2015-08-06 NOTE — Telephone Encounter (Signed)
Will you please let patient know that her liver enzymes are still mildly elevated reflecting mild liver inflammation.  This can be improved by taking a OTC Vitamin E supplement at a dose of 400 Units daily.

## 2015-08-08 ENCOUNTER — Ambulatory Visit: Payer: 59 | Admitting: Physician Assistant

## 2015-08-13 ENCOUNTER — Encounter: Payer: Self-pay | Admitting: Family Medicine

## 2015-08-13 ENCOUNTER — Ambulatory Visit (INDEPENDENT_AMBULATORY_CARE_PROVIDER_SITE_OTHER): Payer: 59 | Admitting: Family Medicine

## 2015-08-13 VITALS — BP 113/76 | HR 79 | Wt 227.0 lb

## 2015-08-13 DIAGNOSIS — R079 Chest pain, unspecified: Secondary | ICD-10-CM

## 2015-08-13 MED ORDER — CYCLOBENZAPRINE HCL 10 MG PO TABS: ORAL_TABLET | ORAL | Status: DC

## 2015-08-13 MED ORDER — PREGABALIN 50 MG PO CAPS: 50.0000 mg | ORAL_CAPSULE | Freq: Two times a day (BID) | ORAL | Status: DC

## 2015-08-13 NOTE — Progress Notes (Signed)
CC: Rachel Rich is a 45 y.o. female is here for Hospitalization Follow-up   Subjective: HPI:  Hospital follow-up for 3 day admission for evaluation of chest pain. She describes episodes where she'll get chest discomfort in the left side of the chest that radiates into the back, twice this is been accompanied by a syncopal episode where she passes out for a few minute. During her hospitalization she had a reassuring chest x-ray, suspicious EKG that then led to a reassuring echocardiogram and nuclear stress test. She continues to have this discomfort. In hindsight she realizes that this is been going on ever since she had sudden onset of anterior chest pain while lifting an 80 pound chair on black Friday. Nothing seems to make chest pain better or worse other than improved with Xanax in the hospital. She notices that during her episodes of chest discomfort she gets extremely anxious which leads to shortness of breath. Fortunately she had a d-dimer on the 13th which was normal.    she denies fevers, chills, cough, wheezing, gastrointestinal complaints nor skin changes.  Review of systems is positive for muscular and soft tissue discomfort involving the chest, back, thighs ever since she became more anxious about her chest discomfort. She denies any other motor or sensory disturbances not described above.   Review Of Systems Outlined In HPI  Past Medical History  Diagnosis Date  . Hyperlipidemia     Past Surgical History  Procedure Laterality Date  . Cesarean section  [id number]    x2   . Hernia repair  2009  . Gallbladder surgery  2000  . Appendectomy  1979  . Tonsillectomy  1976   Family History  Problem Relation Age of Onset  . Alcoholism Father   . Cancer Mother   . Heart attack Father   . Depression Mother   . Hyperlipidemia Father   . Stroke Mother     Social History   Social History  . Marital Status: Married    Spouse Name: N/A  . Number of Children: N/A  . Years of  Education: N/A   Occupational History  . Not on file.   Social History Main Topics  . Smoking status: Never Smoker   . Smokeless tobacco: Not on file  . Alcohol Use: Not on file  . Drug Use: Yes  . Sexual Activity:    Partners: Male   Other Topics Concern  . Not on file   Social History Narrative     Objective: BP 113/76 mmHg  Pulse 79  Wt 227 lb (102.967 kg)  SpO2 95%  Vital signs reviewed. General: Alert and Oriented, No Acute Distress HEENT: Pupils equal, round, reactive to light. Conjunctivae clear.  External ears unremarkable.  Moist mucous membranes. Lungs: Clear and comfortable work of breathing, speaking in full sentences without accessory muscle use. Cardiac: Regular rate and rhythm.  Neuro: CN II-XII grossly intact, gait normal. Extremities: No peripheral edema.  Strong peripheral pulses.  Mental Status: No depression, anxiety, nor agitation. Logical though process. Skin: Warm and dry.  Assessment & Plan: Rachel Rich was seen today for hospitalization follow-up.  Diagnoses and all orders for this visit:  Chest pain, unspecified chest pain type  Other orders -     cyclobenzaprine (FLEXERIL) 10 MG tablet; Take a half to a full tab every 8-12 hours only as needed for muscle spasm or back pain, may cause sedation. -     pregabalin (LYRICA) 50 MG capsule; Take 1 capsule (50 mg total) by  mouth 2 (two) times daily.   Discussed the chest discomfort is most likely costochondritis , I encouraged her to start taking ibuprofen however she's afraid that nonsteroidal anti-inflammatories will cause liver disease. I suspect some of her fibromyalgia has been flared up with muscle discomfort elsewhere therefore starting Lyrica , she is requesting as needed cycle benzathine which seems reasonable.   25 minutes spent face-to-face during visit today of which at least 50% was counseling or coordinating care regarding: 1. Chest pain, unspecified chest pain type      Return if  symptoms worsen or fail to improve.

## 2015-08-14 ENCOUNTER — Inpatient Hospital Stay: Payer: 59 | Admitting: Family Medicine

## 2016-01-28 ENCOUNTER — Ambulatory Visit (INDEPENDENT_AMBULATORY_CARE_PROVIDER_SITE_OTHER): Payer: 59 | Admitting: Family Medicine

## 2016-01-28 ENCOUNTER — Encounter: Payer: Self-pay | Admitting: Family Medicine

## 2016-01-28 VITALS — BP 121/78 | HR 88 | Wt 238.0 lb

## 2016-01-28 DIAGNOSIS — R635 Abnormal weight gain: Secondary | ICD-10-CM

## 2016-01-28 DIAGNOSIS — K120 Recurrent oral aphthae: Secondary | ICD-10-CM | POA: Diagnosis not present

## 2016-01-28 DIAGNOSIS — G43801 Other migraine, not intractable, with status migrainosus: Secondary | ICD-10-CM

## 2016-01-28 MED ORDER — KETOROLAC TROMETHAMINE 60 MG/2ML IM SOLN
60.0000 mg | Freq: Once | INTRAMUSCULAR | Status: AC
  Administered 2016-01-28: 60 mg via INTRAMUSCULAR

## 2016-01-28 MED ORDER — PROMETHAZINE HCL 25 MG/ML IJ SOLN
25.0000 mg | Freq: Once | INTRAMUSCULAR | Status: AC
  Administered 2016-01-28: 25 mg via INTRAMUSCULAR

## 2016-01-28 MED ORDER — PROMETHAZINE HCL 25 MG PO TABS: 25.0000 mg | ORAL_TABLET | Freq: Three times a day (TID) | ORAL | Status: DC | PRN

## 2016-01-28 MED ORDER — SUMATRIPTAN SUCCINATE 25 MG PO TABS: 25.0000 mg | ORAL_TABLET | ORAL | Status: DC | PRN

## 2016-01-28 MED ORDER — MAGIC MOUTHWASH: ORAL | Status: DC

## 2016-01-28 MED ORDER — TOPIRAMATE 25 MG PO TABS: ORAL_TABLET | ORAL | Status: DC

## 2016-01-28 NOTE — Addendum Note (Signed)
Addended by: Delrae Alfred on: 01/28/2016 04:39 PM   Modules accepted: Orders

## 2016-01-28 NOTE — Progress Notes (Signed)
CC: Rachel Rich is a 45 y.o. female is here for Migraine   Subjective: HPI:  Yesterday she began to feel like a migraine was coming on. By the evening she was positive that she was feeling a migraine which is like her usual presentation that involves a pounding right-sided headache with photophobia and phonophobia with nausea. No benefit from Aleve, tramadol or Imitrex. She was able to fall asleep last night but believes that it's 3 times as bad this morning. She denies worst headache of her life and had no difficulty with sleep last night. She denies waking up because of her headache. She's thrown up once since this started. It's been persistent throughout the day not getting any worse since the morning. She denies any motor or sensory disturbances, vision disturbance or confusion. She denies any balance issues her memory loss. Denies fevers or chills. She tells me that the frequency of her migraines have increased since stopping Topamax  She also wants another son that she can take when she gets canker sores. She currently does not have any symptoms but wanted to times a month she'll get a sore inside her mouth and was once prescribed some sort of numbing medication by another doctor. She denies any other skin or mucosal complaints    Review Of Systems Outlined In HPI  Past Medical History  Diagnosis Date  . Hyperlipidemia     Past Surgical History  Procedure Laterality Date  . Cesarean section  [id number]    x2   . Hernia repair  2009  . Gallbladder surgery  2000  . Appendectomy  1979  . Tonsillectomy  1976   Family History  Problem Relation Age of Onset  . Alcoholism Father   . Cancer Mother   . Heart attack Father   . Depression Mother   . Hyperlipidemia Father   . Stroke Mother     Social History   Social History  . Marital Status: Married    Spouse Name: N/A  . Number of Children: N/A  . Years of Education: N/A   Occupational History  . Not on file.   Social  History Main Topics  . Smoking status: Never Smoker   . Smokeless tobacco: Not on file  . Alcohol Use: Not on file  . Drug Use: Yes  . Sexual Activity:    Partners: Male   Other Topics Concern  . Not on file   Social History Narrative     Objective: BP 121/78 mmHg  Pulse 88  Wt 238 lb (107.956 kg)  General: Alert and Oriented, No Acute Distress HEENT: Pupils equal, round, reactive to light. Conjunctivae clear.  External ears unremarkable, canals clear with intact TMs with appropriate landmarks.  Middle ear appears open without effusion. Pink inferior turbinates.  Moist mucous membranes, pharynx without inflammation nor lesions.  Neck supple without palpable lymphadenopathy nor abnormal masses. Lungs: Clear comfortable work of breathing Cardiac: Regular rate and rhythm. Neuro: CN II-XII grossly intact, full strength/rom of all four extremities,, gait normal, rapid alternating movements normal, heel-shin test normal,  Extremities: No peripheral edema.  Strong peripheral pulses.  Mental Status: No depression, anxiety, nor agitation. Skin: Warm and dry.  Assessment & Plan: Ellanore was seen today for migraine.  Diagnoses and all orders for this visit:  Other migraine with status migrainosus, not intractable -     promethazine (PHENERGAN) 25 MG tablet; Take 1 tablet (25 mg total) by mouth every 8 (eight) hours as needed for nausea or vomiting. -  SUMAtriptan (IMITREX) 25 MG tablet; Take 1 tablet (25 mg total) by mouth every 2 (two) hours as needed for migraine or headache. May repeat in 2 hours if headache persists.  Abnormal weight gain -     topiramate (TOPAMAX) 25 MG tablet; One half tab by mouth twice a day for one week then one tab by mouth twice a day daily.  Canker sore  Other orders -     Discontinue: magic mouthwash SOLN; Compounded Mouthwash: Diphenhydramine 12.5mg /mL : Nystatin suspension : Maalox : Viscous Lidocaine 2% (1:1:1:1 Ratio). Swish and swallow 58mL no more  than every 4 hours. -     magic mouthwash SOLN; Compounded Mouthwash: Diphenhydramine 12.5mg /mL : Nystatin suspension : Maalox : Viscous Lidocaine 2% (1:1:1:1 Ratio). Swish and swallow 11mL no more than every 4 hours.   Migraine: Active migraine today therefore she is going to receive Toradol and promethazine IM, her father drove her today and she does not intend on driving back, he'll be driving home. Chronic migraines have been worsening and her uncontrolled therefore restart Topamax. Frequent canker sores: Start as needed magic mouthwash described above.    Return if symptoms worsen or fail to improve.

## 2016-04-13 ENCOUNTER — Encounter: Payer: Self-pay | Admitting: Osteopathic Medicine

## 2016-04-13 ENCOUNTER — Ambulatory Visit (INDEPENDENT_AMBULATORY_CARE_PROVIDER_SITE_OTHER): Payer: 59 | Admitting: Osteopathic Medicine

## 2016-04-13 VITALS — BP 115/54 | HR 76 | Ht 60.0 in | Wt 235.0 lb

## 2016-04-13 DIAGNOSIS — M797 Fibromyalgia: Secondary | ICD-10-CM

## 2016-04-13 DIAGNOSIS — Z91048 Other nonmedicinal substance allergy status: Secondary | ICD-10-CM

## 2016-04-13 DIAGNOSIS — Z23 Encounter for immunization: Secondary | ICD-10-CM | POA: Diagnosis not present

## 2016-04-13 DIAGNOSIS — G4700 Insomnia, unspecified: Secondary | ICD-10-CM

## 2016-04-13 DIAGNOSIS — Z9109 Other allergy status, other than to drugs and biological substances: Secondary | ICD-10-CM

## 2016-04-13 DIAGNOSIS — R635 Abnormal weight gain: Secondary | ICD-10-CM

## 2016-04-13 DIAGNOSIS — F32A Depression, unspecified: Secondary | ICD-10-CM

## 2016-04-13 DIAGNOSIS — Z8639 Personal history of other endocrine, nutritional and metabolic disease: Secondary | ICD-10-CM | POA: Insufficient documentation

## 2016-04-13 DIAGNOSIS — Z780 Asymptomatic menopausal state: Secondary | ICD-10-CM

## 2016-04-13 DIAGNOSIS — G8929 Other chronic pain: Secondary | ICD-10-CM | POA: Insufficient documentation

## 2016-04-13 DIAGNOSIS — G2581 Restless legs syndrome: Secondary | ICD-10-CM

## 2016-04-13 DIAGNOSIS — E785 Hyperlipidemia, unspecified: Secondary | ICD-10-CM

## 2016-04-13 DIAGNOSIS — J4530 Mild persistent asthma, uncomplicated: Secondary | ICD-10-CM | POA: Diagnosis not present

## 2016-04-13 DIAGNOSIS — K760 Fatty (change of) liver, not elsewhere classified: Secondary | ICD-10-CM

## 2016-04-13 DIAGNOSIS — Z8669 Personal history of other diseases of the nervous system and sense organs: Secondary | ICD-10-CM

## 2016-04-13 DIAGNOSIS — F329 Major depressive disorder, single episode, unspecified: Secondary | ICD-10-CM

## 2016-04-13 DIAGNOSIS — Z803 Family history of malignant neoplasm of breast: Secondary | ICD-10-CM

## 2016-04-13 DIAGNOSIS — R1031 Right lower quadrant pain: Secondary | ICD-10-CM

## 2016-04-13 MED ORDER — ROSUVASTATIN CALCIUM 40 MG PO TABS: 40.0000 mg | ORAL_TABLET | Freq: Every day | ORAL | 3 refills | Status: DC

## 2016-04-13 MED ORDER — ARIPIPRAZOLE 5 MG PO TABS: 5.0000 mg | ORAL_TABLET | Freq: Every day | ORAL | 3 refills | Status: DC

## 2016-04-13 MED ORDER — MONTELUKAST SODIUM 10 MG PO TABS: 10.0000 mg | ORAL_TABLET | Freq: Every day | ORAL | 3 refills | Status: DC

## 2016-04-13 MED ORDER — TRAZODONE HCL 100 MG PO TABS: 100.0000 mg | ORAL_TABLET | Freq: Every evening | ORAL | 2 refills | Status: DC | PRN

## 2016-04-13 MED ORDER — TOPIRAMATE 25 MG PO TABS: 25.0000 mg | ORAL_TABLET | Freq: Two times a day (BID) | ORAL | 4 refills | Status: DC

## 2016-04-13 MED ORDER — ROPINIROLE HCL 2 MG PO TABS: 1.0000 mg | ORAL_TABLET | Freq: Every day | ORAL | 3 refills | Status: DC

## 2016-04-13 NOTE — Progress Notes (Signed)
HPI: Rachel Rich is a 45 y.o. female  who presents to [facility] today, 04/13/16,  for chief complaint of:  Chief Complaint  Patient presents with  . Follow-up    MEDICATION     Asthma - Intermittent symptoms, has Symbicort at home which she uses when symptoms worsen in the spring and the fall, requests pneumonia vaccine today  Nonalcoholic hepatosteatosis - patient states due for recheck liver enzymes  Fibromyalgia - occasional flareups, no complaints of this at the moment  RLS (restless legs syndrome) - Requip control symptoms pretty well, needs refill  Hyperlipidemia - needs refill on Crestor  Insomnia - trazodone 100-300 mg at night as needed, requests refill. This controls her symptoms pretty well  Environmental allergies - does okay as long she is taking Singulair, requests refill  Depressive disorder - stable, requests refill of Abilify. No thoughts of depression/anxiety today  History of non anemic vitamin B12 deficiency - requests check of B12, previously on oral medications for this and she thinks injections at some point  Postmenopausal - surgical menopause, hysterectomy due to endometriosis  History of migraine - Topamax 25 mg twice a day controls symptoms, she has Imitrex at home, doesn't need refill. Only very occasional flareups.  Abdominal pain right lower quadrant - occasionally bothers her, feels like bulging or occasional sharp pain. Thinks may be due to scar tissue versus hernia. Not interested in pursuing surgical consult  Past medical, surgical, social and family history reviewed: Past Medical History:  Diagnosis Date  . Asthma, mild persistent 07/25/2014   Symbicort 160-4.5 outside records   . Fibromyalgia 03/26/2014   Tramadol on outside records.   . Hyperlipidemia   . Menopause 07/25/2014   Estradiol 2mg  QD on outside records.   . Other migraine with status migrainosus, not intractable 03/26/2014  . RLS  (restless legs syndrome) 03/26/2014  . Rosacea 07/25/2014   Past Surgical History:  Procedure Laterality Date  . ABDOMINAL HYSTERECTOMY  2009   masses on ovaries, endometriosis, no cancer diagnosis  . APPENDECTOMY  1979  . BREAST LUMPECTOMY Left 2010   pre-cancerous   . CESAREAN SECTION  2001,2007   x2   . GALLBLADDER SURGERY  2000  . HERNIA REPAIR  2009   ventral from c/s  . KNEE SURGERY Right    multiple procedures - arthroscopic   . TONSILLECTOMY  1976   Social History  Substance Use Topics  . Smoking status: Never Smoker  . Smokeless tobacco: Not on file  . Alcohol use No   Family History  Problem Relation Age of Onset  . Alcoholism Father   . Heart attack Father   . Hyperlipidemia Father   . Cancer Mother   . Depression Mother   . Stroke Mother      Current medication list and allergy/intolerance information reviewed:   Current Outpatient Prescriptions  Medication Sig Dispense Refill  . albuterol (PROVENTIL HFA;VENTOLIN HFA) 108 (90 Base) MCG/ACT inhaler Inhale 1-2 puffs into the lungs every 4 (four) hours as needed for wheezing or shortness of breath.    . ARIPiprazole (ABILIFY) 5 MG tablet Take 1 tablet (5 mg total) by mouth daily. 90 tablet 3  . montelukast (SINGULAIR) 10 MG tablet Take 1 tablet (10 mg total) by mouth at bedtime. 90 tablet 3  . Omega-3 Fatty Acids (EQL FISH OIL) 1000 MG CAPS One by mouth BID 90 each   . rOPINIRole (REQUIP) 2 MG tablet Take 0.5 tablets (1 mg total) by  mouth at bedtime. 90 tablet 3  . rosuvastatin (CRESTOR) 40 MG tablet Take 1 tablet (40 mg total) by mouth daily. 90 tablet 3  . SUMAtriptan (IMITREX) 25 MG tablet Take 1 tablet (25 mg total) by mouth every 2 (two) hours as needed for migraine or headache. May repeat in 2 hours if headache persists. 10 tablet 4  . topiramate (TOPAMAX) 25 MG tablet Take 1 tablet (25 mg total) by mouth 2 (two) times daily. 180 tablet 4  . traZODone (DESYREL) 100 MG tablet Take 1-3 tablets (100-300 mg  total) by mouth at bedtime as needed for sleep. 120 tablet 2  . budesonide-formoterol (SYMBICORT) 80-4.5 MCG/ACT inhaler Inhale 2 puffs into the lungs 2 (two) times daily.     No current facility-administered medications for this visit.    Allergies  Allergen Reactions  . Sulphur [Sulfur] Anaphylaxis  . Shellfish-Derived Products Swelling  . Contrast Media [Iodinated Diagnostic Agents]     Rash and vomiting  . Cortisone     Anaphylaxis (outside records) however tolerated prednisone.  Marland Kitchen Macrobid [Nitrofurantoin Monohyd Macro] Swelling  . Tetracyclines & Related     rash  . Topamax [Topiramate]   . Codeine Rash      Review of Systems:  Constitutional:  No  fever, no chills, No recent illness, No unintentional weight changes. +significant fatigue.   HEENT: No  headache, no vision change, no hearing change,  Cardiac: No  chest pain, No  pressure, No palpitations  Respiratory:  No  shortness of breath.   Gastrointestinal: + occasional RLQ abdominal pain ?hernia, No  nausea, No  vomiting,  No  blood in stool, No  diarrhea, No  constipation   Musculoskeletal: No new myalgia/arthralgia  Genitourinary: No  incontinence, No  abnormal genital bleeding, No abnormal genital discharge  Skin: No  Rash, No other wounds/concerning lesions  Hem/Onc: No  easy bruising/bleeding, No  abnormal lymph node  Endocrine: No cold intolerance,  No heat intolerance. No polyuria/polydipsia/polyphagia   Neurologic: No  weakness, No  dizziness, No  slurred speech/focal weakness/facial droop  Psychiatric: No  concerns with depression, No  concerns with anxiety, No sleep problems, No mood problems  Exam:  BP (!) 115/54   Pulse 76   Ht 5' (1.524 m)   Wt 235 lb (106.6 kg)   BMI 45.90 kg/m   Constitutional: VS see above. General Appearance: alert, well-developed, well-nourished, NAD  Eyes: Normal lids and conjunctive, non-icteric sclera  Ears, Nose, Mouth, Throat: MMM, Normal external inspection  ears/nares/mouth/lips/gums. TM normal bilaterally. Pharynx/tonsils no erythema, no exudate. Nasal mucosa normal.   Neck: No masses, trachea midline. No thyroid enlargement. No tenderness/mass appreciated. No lymphadenopathy  Respiratory: Normal respiratory effort. no wheeze, no rhonchi, no rales  Cardiovascular: S1/S2 normal, no murmur, no rub/gallop auscultated. RRR. No lower extremity edema.  Gastrointestinal: Nontender, no masses. No hepatomegaly, no splenomegaly. No hernia appreciated. Bowel sounds normal. Rectal exam deferred.   Musculoskeletal: Gait normal. No clubbing/cyanosis of digits.   Neurological: No cranial nerve deficit on limited exam. Motor and sensation intact and symmetric. Cerebellar reflexes intact. Normal balance/coordination. No tremor.   Skin: warm, dry, intact. No rash/ulcer.   Psychiatric: Normal judgment/insight. Normal mood and affect. Oriented x3.     ASSESSMENT/PLAN: Medical history reviewed, chart updated, refilled medications as below.   Asthma, mild persistent, uncomplicated - Plan: Pneumococcal polysaccharide vaccine 23-valent greater than or equal to 2yo subcutaneous/IM, budesonide-formoterol (SYMBICORT) 80-4.5 MCG/ACT inhaler, albuterol (PROVENTIL HFA;VENTOLIN HFA) 108 (90 Base) MCG/ACT inhaler, montelukast (  SINGULAIR) 10 MG tablet, CBC with Differential/Platelet  Need for 23-polyvalent pneumococcal polysaccharide vaccine - Plan: Pneumococcal polysaccharide vaccine 23-valent greater than or equal to 2yo subcutaneous/IM  Nonalcoholic hepatosteatosis  Fibromyalgia  Abnormal weight gain  RLS (restless legs syndrome) - Plan: rOPINIRole (REQUIP) 2 MG tablet  Hyperlipidemia - Plan: rosuvastatin (CRESTOR) 40 MG tablet, COMPLETE METABOLIC PANEL WITH GFR, Lipid panel  Insomnia - Plan: traZODone (DESYREL) 100 MG tablet  Environmental allergies - Plan: montelukast (SINGULAIR) 10 MG tablet  Depressive disorder - Plan: ARIPiprazole (ABILIFY) 5 MG  tablet  History of non anemic vitamin B12 deficiency - Plan: B12  Postmenopausal - Plan: VITAMIN D 25 Hydroxy (Vit-D Deficiency, Fractures)  History of migraine - Plan: topiramate (TOPAMAX) 25 MG tablet  Abdominal pain, chronic, right lower quadrant - Offered surgical referral versus monitor. Patient opts to watch this. Precautions reviewed  Family history of breast cancer - Following for genetic testing     Visit summary with medication list and pertinent instructions was printed for patient to review. All questions at time of visit were answered - patient instructed to contact office with any additional concerns. ER/RTC precautions were reviewed with the patient. Follow-up plan: Return for annual physical when due, 1 year/as needed.  Note: Total time spent 30 minutes, greater than 50% of the visit was spent face-to-face counseling and coordinating care for the following: The primary encounter diagnosis was Asthma, mild persistent, uncomplicated. Diagnoses of Need for 23-polyvalent pneumococcal polysaccharide vaccine, Nonalcoholic hepatosteatosis, Fibromyalgia, Abnormal weight gain, RLS (restless legs syndrome), Hyperlipidemia, Insomnia, Environmental allergies, Depressive disorder, History of non anemic vitamin B12 deficiency, Postmenopausal, History of migraine, and Abdominal pain, chronic, right lower quadrant were also pertinent to this visit.Marland Kitchen

## 2016-04-14 LAB — COMPLETE METABOLIC PANEL WITH GFR
ALK PHOS: 64 U/L (ref 33–115)
ALT: 53 U/L — AB (ref 6–29)
AST: 43 U/L — ABNORMAL HIGH (ref 10–35)
Albumin: 4.3 g/dL (ref 3.6–5.1)
BILIRUBIN TOTAL: 0.7 mg/dL (ref 0.2–1.2)
BUN: 8 mg/dL (ref 7–25)
CO2: 25 mmol/L (ref 20–31)
CREATININE: 0.95 mg/dL (ref 0.50–1.10)
Calcium: 9 mg/dL (ref 8.6–10.2)
Chloride: 107 mmol/L (ref 98–110)
GFR, Est African American: 84 mL/min (ref >=60)
GFR, Est Non African American: 73 mL/min (ref >=60)
GLUCOSE: 108 mg/dL — AB (ref 65–99)
Potassium: 4.5 mmol/L (ref 3.5–5.3)
SODIUM: 141 mmol/L (ref 135–146)
TOTAL PROTEIN: 6.7 g/dL (ref 6.1–8.1)

## 2016-04-14 LAB — LIPID PANEL
Cholesterol: 137 mg/dL (ref 125–200)
HDL: 47 mg/dL (ref >=46)
LDL CALC: 60 mg/dL (ref <130)
TRIGLYCERIDES: 151 mg/dL — AB (ref <150)
Total CHOL/HDL Ratio: 2.9 Ratio (ref <=5.0)
VLDL: 30 mg/dL (ref <30)

## 2016-04-14 LAB — CBC WITH DIFFERENTIAL/PLATELET
BASOS PCT: 1 %
Basophils Absolute: 64 cells/uL (ref 0–200)
EOS PCT: 1 %
Eosinophils Absolute: 64 cells/uL (ref 15–500)
HCT: 41.4 % (ref 35.0–45.0)
Hemoglobin: 13.3 g/dL (ref 11.7–15.5)
Lymphocytes Relative: 34 %
Lymphs Abs: 2176 cells/uL (ref 850–3900)
MCH: 28.4 pg (ref 27.0–33.0)
MCHC: 32.1 g/dL (ref 32.0–36.0)
MCV: 88.3 fL (ref 80.0–100.0)
MONOS PCT: 4 %
MPV: 11.1 fL (ref 7.5–12.5)
Monocytes Absolute: 256 cells/uL (ref 200–950)
NEUTROS ABS: 3840 {cells}/uL (ref 1500–7800)
Neutrophils Relative %: 60 %
PLATELETS: 211 10*3/uL (ref 140–400)
RBC: 4.69 MIL/uL (ref 3.80–5.10)
RDW: 13.8 % (ref 11.0–15.0)
WBC: 6.4 10*3/uL (ref 3.8–10.8)

## 2016-04-14 LAB — VITAMIN D 25 HYDROXY (VIT D DEFICIENCY, FRACTURES): VIT D 25 HYDROXY: 32 ng/mL (ref 30–100)

## 2016-04-14 LAB — VITAMIN B12: Vitamin B-12: 410 pg/mL (ref 200–1100)

## 2016-04-23 ENCOUNTER — Encounter: Payer: Self-pay | Admitting: *Deleted

## 2016-04-23 ENCOUNTER — Emergency Department (INDEPENDENT_AMBULATORY_CARE_PROVIDER_SITE_OTHER): Payer: 59

## 2016-04-23 ENCOUNTER — Emergency Department
Admission: EM | Admit: 2016-04-23 | Discharge: 2016-04-23 | Disposition: A | Discharge status: Home / Self Care | Payer: 59 | Source: Home / Self Care | Attending: Family Medicine | Admitting: Family Medicine

## 2016-04-23 DIAGNOSIS — R109 Unspecified abdominal pain: Secondary | ICD-10-CM

## 2016-04-23 LAB — POCT URINALYSIS DIP (MANUAL ENTRY)
BILIRUBIN UA: NEGATIVE
Glucose, UA: NEGATIVE
Ketones, POC UA: NEGATIVE
Leukocytes, UA: NEGATIVE
NITRITE UA: NEGATIVE
PH UA: 7 (ref 5–8)
RBC UA: NEGATIVE
Spec Grav, UA: 1.02 (ref 1.005–1.03)
UROBILINOGEN UA: 0.2 (ref 0–1)

## 2016-04-23 IMAGING — CT CT ABD-PELV W/O CM
2 of 4 series · 17 of 46 positions shown, 19 images · non-contrast
Comparison: None.

CLINICAL DATA: Left flank and abdominal pain for 5 days with
hematuria

EXAM:
CT ABDOMEN AND PELVIS WITHOUT CONTRAST
TECHNIQUE: Multidetector CT imaging of the abdomen and pelvis was performed
following the standard protocol without IV contrast.

[Series 2: axial st · axial · 0.87mm/px · z∈[-490,-85]mm · 14 of 89 slices shown, 16 images]
[im 4/89  soft-tissue]
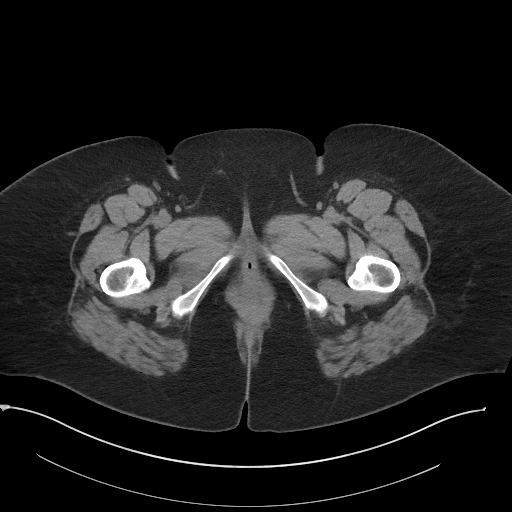
[im 4/89  bone]
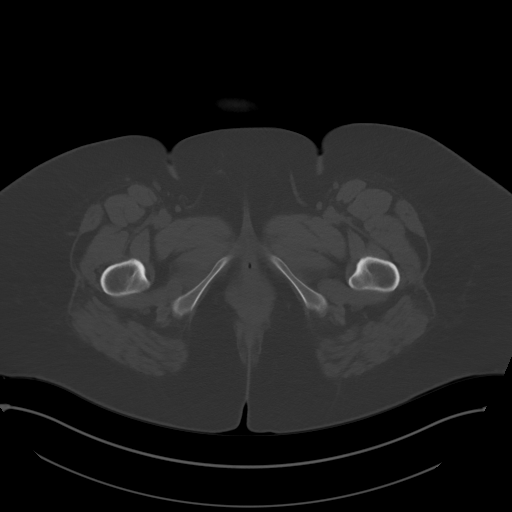
[im 12/89  soft-tissue]
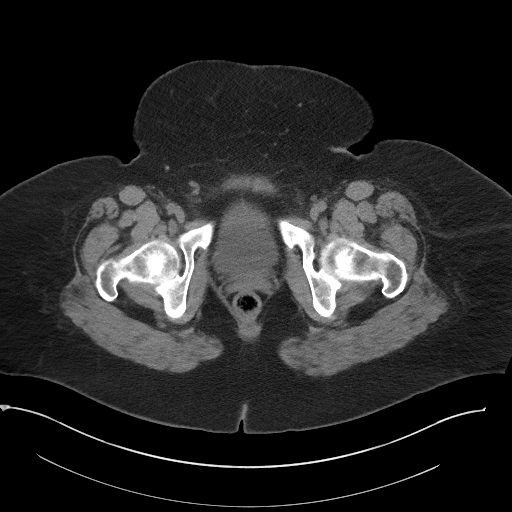
[im 16/89  soft-tissue]
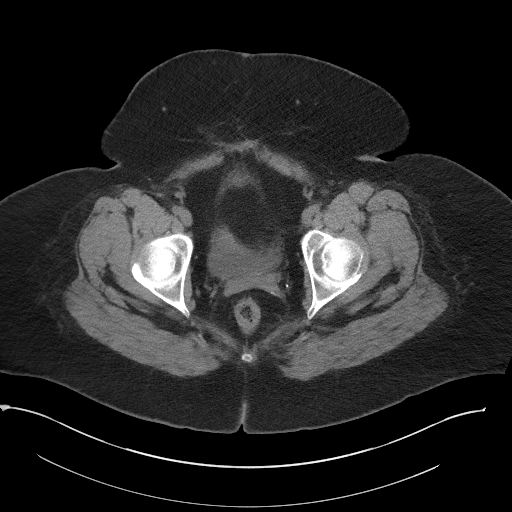
[im 23/89  soft-tissue]
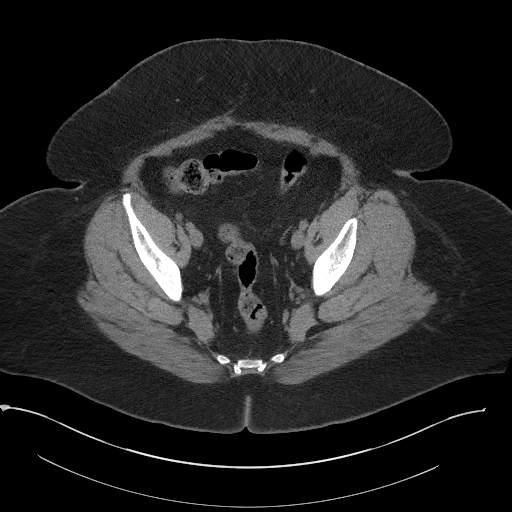
[im 31/89  soft-tissue]
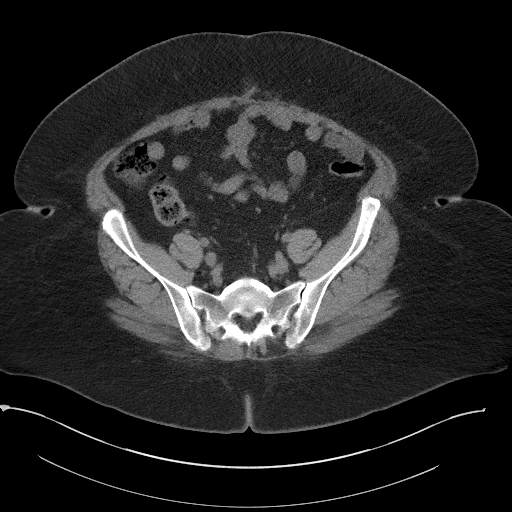
[im 35/89  soft-tissue]
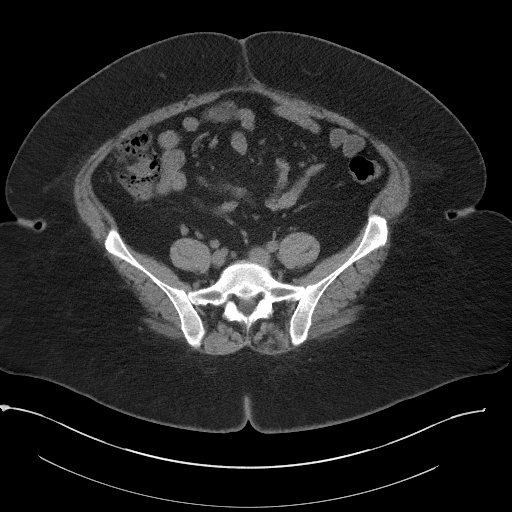
[im 43/89  soft-tissue]
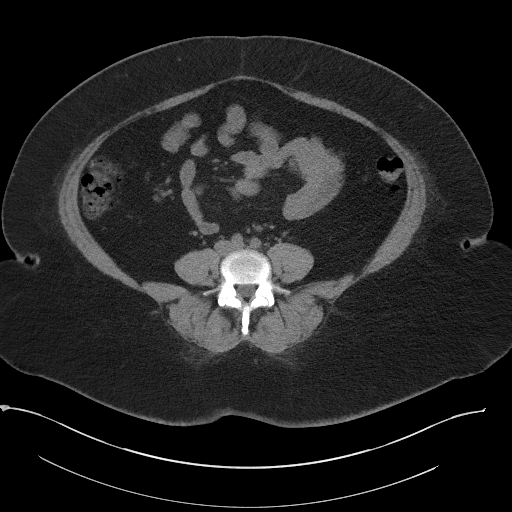
[im 46/89  soft-tissue]
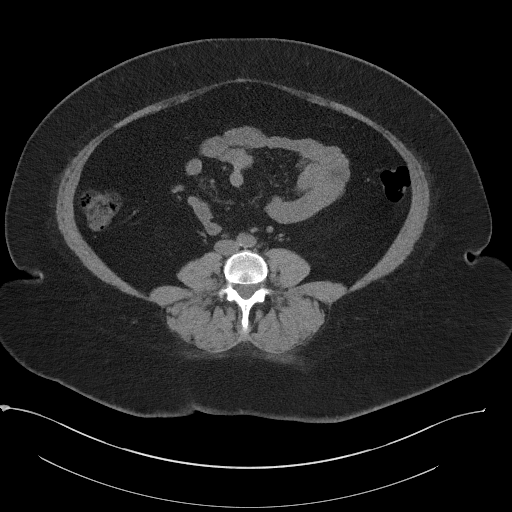
[im 54/89  soft-tissue]
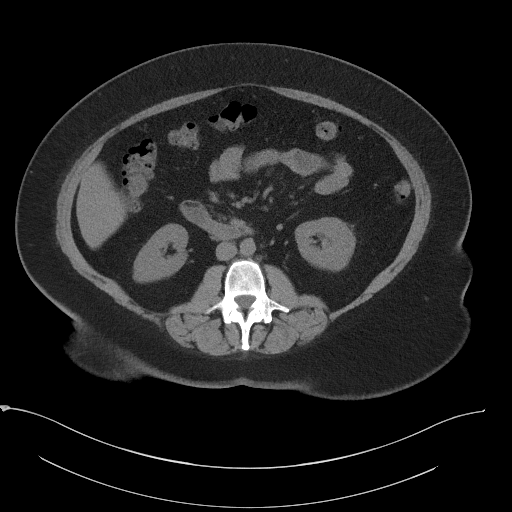
[im 54/89  bone]
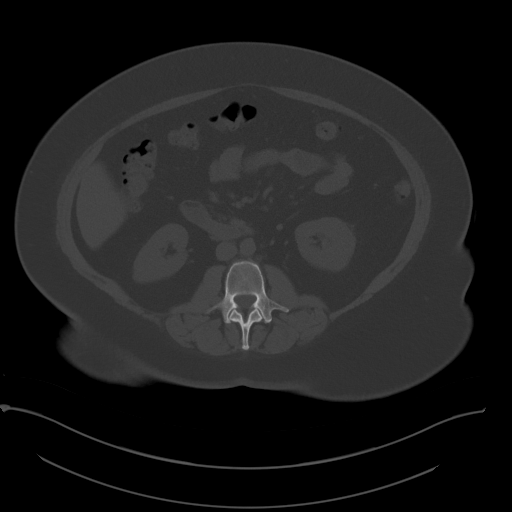
[im 58/89  soft-tissue]
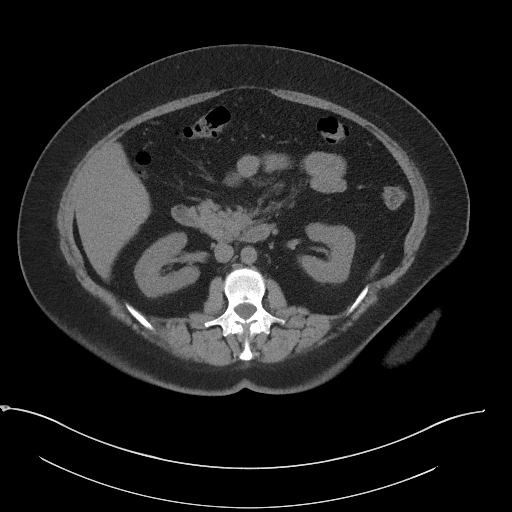
[im 66/89  soft-tissue]
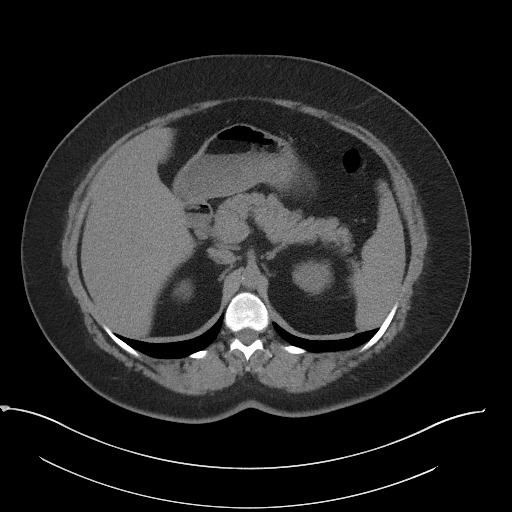
[im 73/89  soft-tissue]
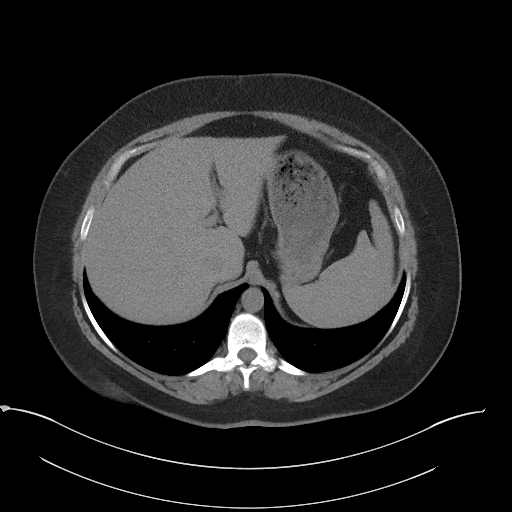
[im 77/89  soft-tissue]
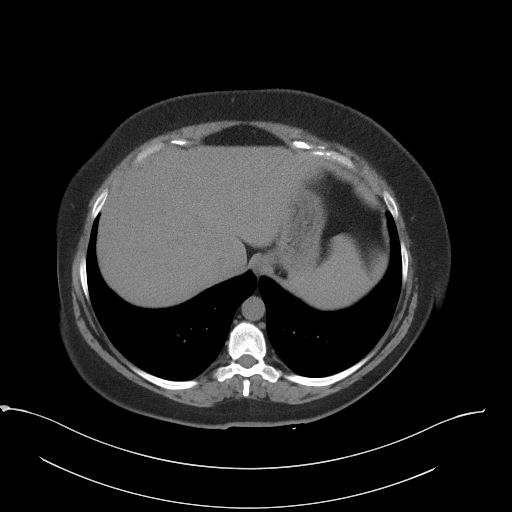
[im 85/89  soft-tissue]
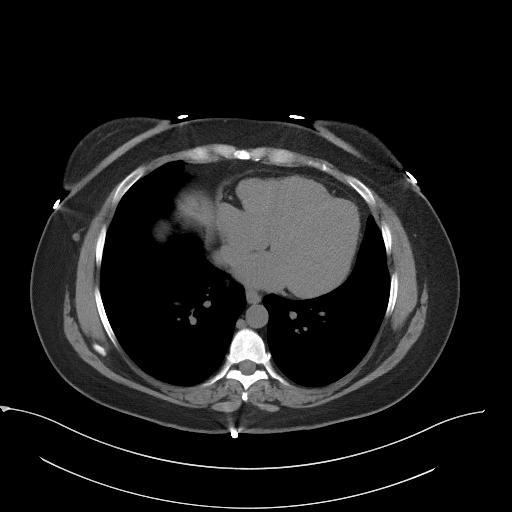

[Series 4: coronal st · coronal · 0.79mm/px · 3 of 107 slices shown]
[im 36/107  soft-tissue]
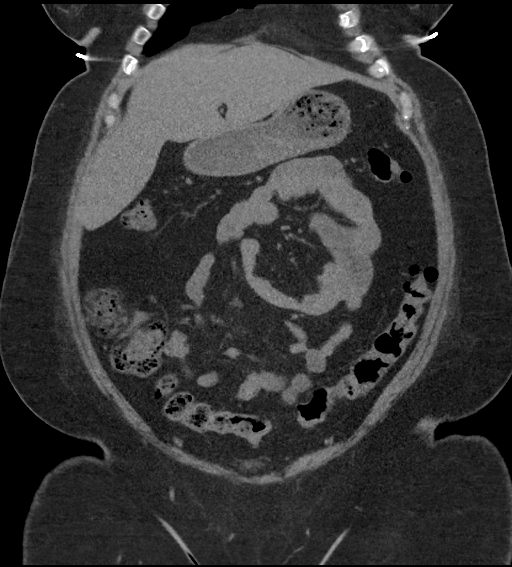
[im 48/107  soft-tissue]
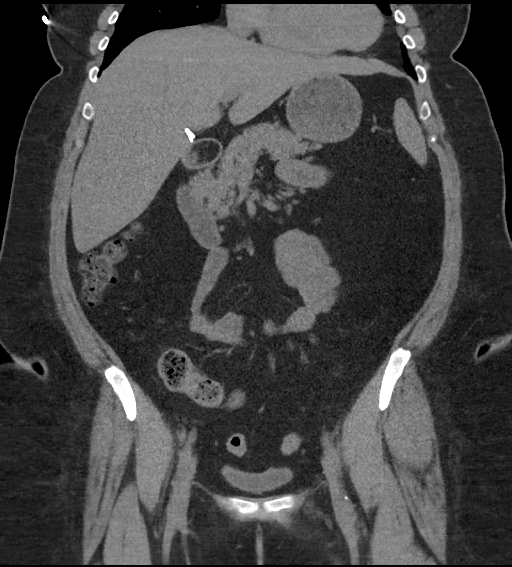
[im 59/107  soft-tissue]
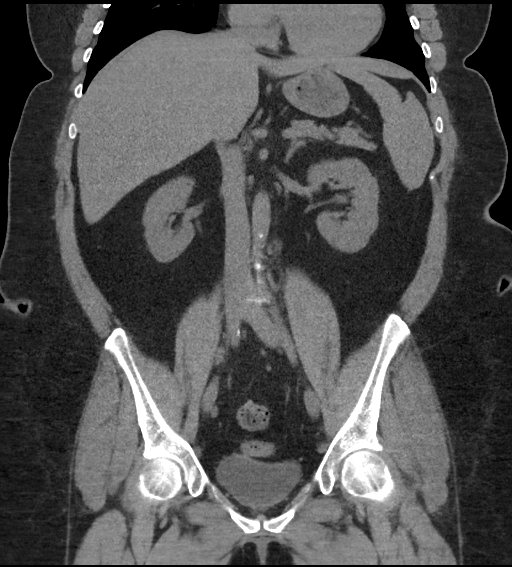

[17 of 46 positions shown; findings below may reference images not displayed]

FINDINGS: Lower chest:  No contributory findings.

Hepatobiliary: No focal liver abnormality.Cholecystectomy with
normal diameter common bile duct.

Pancreas: Unremarkable.

Spleen: Unremarkable.

Adrenals/Urinary Tract: Negative adrenals. No nephrolithiasis or
hydronephrosis. Multiple calcifications along the lower left ureter,
all best ascribed to phleboliths. No suspected ureteral calculus.
Unremarkable bladder.

Stomach/Bowel:  No obstruction. Appendectomy.

Vascular/Lymphatic: Scattered atherosclerotic calcification on the
abdominal aorta. No acute vascular abnormality. No mass or
adenopathy.

Reproductive:Hysterectomy.

Other: No ascites or pneumoperitoneum.

Musculoskeletal: No acute abnormalities.
IMPRESSION: No explanation for abdominal pain.

## 2016-04-23 MED ORDER — ACETAMINOPHEN 325 MG PO TABS
650.0000 mg | ORAL_TABLET | Freq: Once | ORAL | Status: AC
  Administered 2016-04-23: 650 mg via ORAL

## 2016-04-23 NOTE — ED Triage Notes (Signed)
Pt believes she may have passed a stone 4 days ago. Had pain with blood in urine and blood on toilet paper. Since, still c/i left flank and side pain. Afebrile. Taken tylenol and motrin.

## 2016-04-23 NOTE — Discharge Instructions (Signed)
Increase fluid intake.  May take Tylenol if needed for pain.  Call if rash develops.  If symptoms become significantly worse during the night or over the weekend, proceed to the local emergency room.

## 2016-04-23 NOTE — ED Provider Notes (Signed)
[name]    CSN: [id number] Arrival date & time: 04/23/16  1444     History   Chief Complaint Chief Complaint  Patient presents with  . Flank Pain    HPI Rachel Rich is a 45 y.o. female.   Patient believes that she may have passed a kidney stone four days ago.  She developed left lower back pain that radiated to her left abdomen.  She had noticed gross hematuria, now resolved, but still has dark orange urine.  She complains of persistent left flank pain.  No fevers, chills, and sweats.  No nausea/vomiting.  She feels well otherwise. She has a history of nephrolithiasis, and last passed a stone about 7 years ago.   The history is provided by the patient.  Flank Pain  This is a new problem. Episode onset: 4 days ago. The problem has not changed since onset.Associated symptoms include abdominal pain. Pertinent negatives include no chest pain. Nothing aggravates the symptoms. Nothing relieves the symptoms. She has tried acetaminophen (ibuprofen) for the symptoms. The treatment provided mild relief.    Past Medical History:  Diagnosis Date  . Asthma, mild persistent 07/25/2014   Symbicort 160-4.5 outside records   . Fibromyalgia 03/26/2014   Tramadol on outside records.   . Hyperlipidemia   . Menopause 07/25/2014   Estradiol 2mg  QD on outside records.   . Other migraine with status migrainosus, not intractable 03/26/2014  . RLS (restless legs syndrome) 03/26/2014  . Rosacea 07/25/2014    Patient Active Problem List   Diagnosis Date Noted  . Family history of breast cancer 04/13/2016  . Abdominal pain, chronic, right lower quadrant 04/13/2016  . History of migraine 04/13/2016  . History of non anemic vitamin B12 deficiency 04/13/2016  . Postmenopausal 04/13/2016  . Abnormal weight gain 01/16/2015  . Menopause 07/25/2014  . Asthma, mild persistent 07/25/2014  . HSV infection 07/25/2014  . Bipolar disorder ([place]) 07/25/2014  . Fibroadenoma of left breast  07/25/2014  . Cystadenoma of left ovary 07/25/2014  . Rosacea 07/25/2014  . Insomnia 03/26/2014  . Environmental allergies 03/26/2014  . Fibromyalgia 03/26/2014  . RLS (restless legs syndrome) 03/26/2014  . Other migraine with status migrainosus, not intractable 03/26/2014  . Nonalcoholic hepatosteatosis 99991111  . Hyperlipidemia 03/29/2010    Past Surgical History:  Procedure Laterality Date  . ABDOMINAL HYSTERECTOMY  2009   masses on ovaries, endometriosis, no cancer diagnosis  . APPENDECTOMY  1979  . BREAST LUMPECTOMY Left 2010   pre-cancerous   . CESAREAN SECTION  2001,2007   x2   . GALLBLADDER SURGERY  2000  . HERNIA REPAIR  2009   ventral from c/s  . KNEE SURGERY Right    multiple procedures - arthroscopic   . TONSILLECTOMY  1976    OB History    No data available       Home Medications    Prior to Admission medications   Medication Sig Start Date End Date Taking? Authorizing Provider  albuterol (PROVENTIL HFA;VENTOLIN HFA) 108 (90 Base) MCG/ACT inhaler Inhale 1-2 puffs into the lungs every 4 (four) hours as needed for wheezing or shortness of breath.    Historical Provider, MD  ARIPiprazole (ABILIFY) 5 MG tablet Take 1 tablet (5 mg total) by mouth daily. 04/13/16   Emeterio Reeve, DO  budesonide-formoterol (SYMBICORT) 80-4.5 MCG/ACT inhaler Inhale 2 puffs into the lungs 2 (two) times daily.    Historical Provider, MD  montelukast (SINGULAIR) 10 MG tablet Take 1 tablet (10 mg  total) by mouth at bedtime. 04/13/16   Emeterio Reeve, DO  Omega-3 Fatty Acids (EQL FISH OIL) 1000 MG CAPS One by mouth BID 05/12/15   Sean Hommel, DO  rOPINIRole (REQUIP) 2 MG tablet Take 0.5 tablets (1 mg total) by mouth at bedtime. 04/13/16   Emeterio Reeve, DO  rosuvastatin (CRESTOR) 40 MG tablet Take 1 tablet (40 mg total) by mouth daily. 04/13/16   Emeterio Reeve, DO  SUMAtriptan (IMITREX) 25 MG tablet Take 1 tablet (25 mg total) by mouth every 2 (two) hours as needed for  migraine or headache. May repeat in 2 hours if headache persists. 01/28/16   Sean Hommel, DO  topiramate (TOPAMAX) 25 MG tablet Take 1 tablet (25 mg total) by mouth 2 (two) times daily. 04/13/16   Emeterio Reeve, DO  traZODone (DESYREL) 100 MG tablet Take 1-3 tablets (100-300 mg total) by mouth at bedtime as needed for sleep. 04/13/16   Emeterio Reeve, DO    Family History Family History  Problem Relation Age of Onset  . Alcoholism Father   . Heart attack Father   . Hyperlipidemia Father   . Cancer Mother   . Depression Mother   . Stroke Mother   . Cancer Sister     Social History Social History  Substance Use Topics  . Smoking status: Never Smoker  . Smokeless tobacco: Never Used  . Alcohol use No     Allergies   Sulphur [sulfur]; Shellfish-derived products; Contrast media [iodinated diagnostic agents]; Cortisone; Macrobid [nitrofurantoin monohyd macro]; Tetracyclines & related; Topamax [topiramate]; and Codeine   Review of Systems Review of Systems  Cardiovascular: Negative for chest pain.  Gastrointestinal: Positive for abdominal pain.  Genitourinary: Positive for flank pain.  All other systems reviewed and are negative.    Physical Exam Triage Vital Signs ED Triage Vitals  Enc Vitals Group     BP 04/23/16 1531 117/77     Pulse Rate 04/23/16 1531 73     Resp 04/23/16 1531 16     Temp 04/23/16 1531 98.1 F (36.7 C)     Temp Source 04/23/16 1531 Oral     SpO2 04/23/16 1531 100 %     Weight 04/23/16 1531 233 lb (105.7 kg)     Height 04/23/16 1531 5' (1.524 m)     Head Circumference --      Peak Flow --      Pain Score 04/23/16 1533 7     Pain Loc --      Pain Edu? --      Excl. in [place]? --    No data found.   Updated Vital Signs BP 117/77 (BP Location: Left Arm)   Pulse 73   Temp 98.1 F (36.7 C) (Oral)   Resp 16   Ht 5' (1.524 m)   Wt 233 lb (105.7 kg)   SpO2 100%   BMI 45.50 kg/m   Visual Acuity Right Eye Distance:   Left Eye Distance:     Bilateral Distance:    Right Eye Near:   Left Eye Near:    Bilateral Near:     Physical Exam  Constitutional: She appears well-developed and well-nourished. No distress.  HENT:  Head: Normocephalic.  Nose: Nose normal.  Mouth/Throat: Oropharynx is clear and moist.  Eyes: Conjunctivae and EOM are normal. Pupils are equal, round, and reactive to light.  Neck: Neck supple.  Cardiovascular: Normal heart sounds.   Pulmonary/Chest: Breath sounds normal.  Abdominal: Soft. Bowel sounds are normal. She exhibits  no distension. There is no hepatosplenomegaly. There is no tenderness. There is no guarding and no CVA tenderness.    Patient complains of pain in her left flank and CVA area, yet there is no tenderness to palpation there.  Musculoskeletal: She exhibits no edema.       Lumbar back: She exhibits no tenderness.       Back:  Lymphadenopathy:    She has no cervical adenopathy.  Neurological: She is alert.  Skin: Skin is warm and dry. No rash noted.  Nursing note and vitals reviewed.    UC Treatments / Results  Labs (all labs ordered are listed, but only abnormal results are displayed) Labs Reviewed  POCT URINALYSIS DIP (MANUAL ENTRY) - Abnormal; Notable for the following:       Result Value   Clarity, UA cloudy (*)    Protein Ur, POC =100 (*)    All other components within normal limits  URINE CULTURE    EKG  EKG Interpretation None       Radiology Ct Abdomen Pelvis Wo Contrast  Result Date: 04/23/2016 CLINICAL DATA:  Left flank and abdominal pain for 5 days with hematuria EXAM: CT ABDOMEN AND PELVIS WITHOUT CONTRAST TECHNIQUE: Multidetector CT imaging of the abdomen and pelvis was performed following the standard protocol without IV contrast. COMPARISON:  None. FINDINGS: Lower chest:  No contributory findings. Hepatobiliary: No focal liver abnormality.Cholecystectomy with normal diameter common bile duct. Pancreas: Unremarkable. Spleen: Unremarkable.  Adrenals/Urinary Tract: Negative adrenals. No nephrolithiasis or hydronephrosis. Multiple calcifications along the lower left ureter, all best ascribed to phleboliths. No suspected ureteral calculus. Unremarkable bladder. Stomach/Bowel:  No obstruction. Appendectomy. Vascular/Lymphatic: Scattered atherosclerotic calcification on the abdominal aorta. No acute vascular abnormality. No mass or adenopathy. Reproductive:Hysterectomy. Other: No ascites or pneumoperitoneum. Musculoskeletal: No acute abnormalities. IMPRESSION: No explanation for abdominal pain. Electronically Signed   By: Monte Fantasia M.D.   On: 04/23/2016 16:42    Procedures Procedures (including critical care time)  Medications Ordered in UC Medications  acetaminophen (TYLENOL) tablet 650 mg (650 mg Oral Given 04/23/16 1537)     Initial Impression / Assessment and Plan / UC Course  I have reviewed the triage vital signs and the nursing notes.  Pertinent labs & imaging results that were available during my care of the patient were reviewed by me and considered in my medical decision making (see chart for details).  Clinical Course  No CT evidence ureterolithiasis. ?early shingles. Urine culture pending. Increase fluid intake.  May take Tylenol if needed for pain.  Call if rash develops (would start Valtrex).  If symptoms become significantly worse during the night or over the weekend, proceed to the local emergency room.  Followup with Family Doctor if not improved in one week.      Final Clinical Impressions(s) / UC Diagnoses   Final diagnoses:  Acute left flank pain    New Prescriptions New Prescriptions   No medications on file     Kandra Nicolas, MD 04/27/16 2244

## 2016-04-25 ENCOUNTER — Telehealth: Payer: Self-pay | Admitting: Emergency Medicine

## 2016-04-25 LAB — URINE CULTURE: Organism ID, Bacteria: NO GROWTH

## 2016-04-25 NOTE — Telephone Encounter (Signed)
Inquired about patient's status; encourage them to call with questions/concerns.  

## 2016-04-26 ENCOUNTER — Encounter: Payer: Self-pay | Admitting: Osteopathic Medicine

## 2016-05-05 ENCOUNTER — Encounter: Payer: Self-pay | Admitting: Osteopathic Medicine

## 2016-05-05 ENCOUNTER — Other Ambulatory Visit: Payer: Self-pay | Admitting: Osteopathic Medicine

## 2016-05-05 DIAGNOSIS — G2581 Restless legs syndrome: Secondary | ICD-10-CM

## 2016-05-05 MED ORDER — ROPINIROLE HCL 2 MG PO TABS: 2.0000 mg | ORAL_TABLET | Freq: Every day | ORAL | 3 refills | Status: DC

## 2016-05-12 ENCOUNTER — Ambulatory Visit (INDEPENDENT_AMBULATORY_CARE_PROVIDER_SITE_OTHER): Payer: 59 | Admitting: Osteopathic Medicine

## 2016-05-12 ENCOUNTER — Encounter: Payer: Self-pay | Admitting: Osteopathic Medicine

## 2016-05-12 VITALS — BP 112/72 | HR 69 | Ht 60.0 in | Wt 236.0 lb

## 2016-05-12 DIAGNOSIS — M797 Fibromyalgia: Secondary | ICD-10-CM

## 2016-05-12 DIAGNOSIS — R6889 Other general symptoms and signs: Secondary | ICD-10-CM | POA: Diagnosis not present

## 2016-05-12 NOTE — Progress Notes (Signed)
HPI: Rachel Rich is a 45 y.o. female  who presents to [facility] today, 05/12/16,  for chief complaint of:  Chief Complaint  Patient presents with  . Establish Care    Patient has a history of fibromyalgia and rheumatoid arthritis, is here to get paperwork filled out so that work will accommodate her with a space heater at her desk. Apparently her office keeps the temperature quite cool, this aggravates her musculoskeletal conditions, she complains of joint pain and muscle pain when she is colder and this is relieved by warm on the on temperature. She reports wearing layers of clothing as well as using a blanket at her desk.   Past medical, surgical, social and family history reviewed: Past Medical History:  Diagnosis Date  . Asthma, mild persistent 07/25/2014   Symbicort 160-4.5 outside records   . Fibromyalgia 03/26/2014   Tramadol on outside records.   . Hyperlipidemia   . Menopause 07/25/2014   Estradiol 2mg  QD on outside records.   . Other migraine with status migrainosus, not intractable 03/26/2014  . RLS (restless legs syndrome) 03/26/2014  . Rosacea 07/25/2014   Past Surgical History:  Procedure Laterality Date  . ABDOMINAL HYSTERECTOMY  2009   masses on ovaries, endometriosis, no cancer diagnosis  . APPENDECTOMY  1979  . BREAST LUMPECTOMY Left 2010   pre-cancerous   . CESAREAN SECTION  2001,2007   x2   . GALLBLADDER SURGERY  2000  . HERNIA REPAIR  2009   ventral from c/s  . KNEE SURGERY Right    multiple procedures - arthroscopic   . TONSILLECTOMY  1976   Social History  Substance Use Topics  . Smoking status: Never Smoker  . Smokeless tobacco: Never Used  . Alcohol use No   Family History  Problem Relation Age of Onset  . Alcoholism Father   . Heart attack Father   . Hyperlipidemia Father   . Cancer Mother   . Depression Mother   . Stroke Mother   . Cancer Sister      Current medication list and allergy/intolerance  information reviewed:   Current Outpatient Prescriptions  Medication Sig Dispense Refill  . albuterol (PROVENTIL HFA;VENTOLIN HFA) 108 (90 Base) MCG/ACT inhaler Inhale 1-2 puffs into the lungs every 4 (four) hours as needed for wheezing or shortness of breath.    . ARIPiprazole (ABILIFY) 5 MG tablet Take 1 tablet (5 mg total) by mouth daily. 90 tablet 3  . budesonide-formoterol (SYMBICORT) 80-4.5 MCG/ACT inhaler Inhale 2 puffs into the lungs 2 (two) times daily.    . montelukast (SINGULAIR) 10 MG tablet Take 1 tablet (10 mg total) by mouth at bedtime. 90 tablet 3  . Omega-3 Fatty Acids (EQL FISH OIL) 1000 MG CAPS One by mouth BID 90 each   . rOPINIRole (REQUIP) 2 MG tablet Take 1 tablet (2 mg total) by mouth at bedtime. 90 tablet 3  . rosuvastatin (CRESTOR) 40 MG tablet Take 1 tablet (40 mg total) by mouth daily. 90 tablet 3  . SUMAtriptan (IMITREX) 25 MG tablet Take 1 tablet (25 mg total) by mouth every 2 (two) hours as needed for migraine or headache. May repeat in 2 hours if headache persists. 10 tablet 4  . topiramate (TOPAMAX) 25 MG tablet Take 1 tablet (25 mg total) by mouth 2 (two) times daily. 180 tablet 4  . traZODone (DESYREL) 100 MG tablet Take 1-3 tablets (100-300 mg total) by mouth at bedtime as needed for sleep. 120 tablet  2   No current facility-administered medications for this visit.    Allergies  Allergen Reactions  . Sulphur [Sulfur] Anaphylaxis  . Shellfish-Derived Products Swelling  . Contrast Media [Iodinated Diagnostic Agents]     Rash and vomiting  . Cortisone     Anaphylaxis (outside records) however tolerated prednisone.  Marland Kitchen Macrobid [Nitrofurantoin Monohyd Macro] Swelling  . Tetracyclines & Related     rash  . Topamax [Topiramate]     High dose caused vision problems but low dose tolerates fine   . Codeine Rash      Review of Systems:  Constitutional:  No  fever, no chills, No recent illness  HEENT: No  headache  Cardiac: No  chest  pain,  Respiratory:  No  shortness of breath  Musculoskeletal: +new myalgia/arthralgia generalized  Skin: No  Rash, No other wounds/concerning lesions  Endocrine: +cold intolerance,  No heat intolerance. No polyuria/polydipsia/polyphagia   Neurologic: No  weakness, No  dizziness   Exam:  BP 112/72   Pulse 69   Ht 5' (1.524 m)   Wt 236 lb (107 kg)   BMI 46.09 kg/m   Constitutional: VS see above. General Appearance: alert, well-developed, well-nourished, NAD  Eyes: Normal lids and conjunctive, non-icteric sclera  Ears, Nose, Mouth, Throat: MMM,   Neck: No masses, trachea midline.   Respiratory: Normal respiratory effort.  Musculoskeletal: Gait normal. No clubbing/cyanosis of digits.   Neurological: Normal balance/coordination. No tremor.   Skin: warm, dry, intact. No rash/ulcer.     Psychiatric: Normal judgment/insight. Normal mood and affect. Oriented x3.     ASSESSMENT/PLAN: See scan documents. We'll need to clarify diagnosis of rheumatoid arthritis as this is not included on her problem list will need to request records. I can find no evidence based data that warm ambient temperature affects measurable outcomes for fibromyalgia pain, however would be reasonable to conclude that stress on the body due to cold/discomfort can certainly aggravate this condition and make it difficult for the patient to work effectively. Heater seems a reasonable accommodation to me. I filled out paperwork to best my ability, reviewed it with the patient and she is agreeable with the papers that I have completed.  Fibromyalgia  Cold intolerance     Visit summary with medication list and pertinent instructions was printed for patient to review. All questions at time of visit were answered - patient instructed to contact office with any additional concerns. ER/RTC precautions were reviewed with the patient. Follow-up plan: Return for annual physical when due.  Note: Total time spent 25  minutes, greater than 50% of the visit was spent face-to-face counseling and coordinating care for the following: The primary encounter diagnosis was Fibromyalgia. A diagnosis of Cold intolerance was also pertinent to this visit.Marland Kitchen

## 2016-05-21 ENCOUNTER — Other Ambulatory Visit: Payer: Self-pay

## 2016-05-21 ENCOUNTER — Encounter: Payer: Self-pay | Admitting: Osteopathic Medicine

## 2016-05-21 DIAGNOSIS — G2581 Restless legs syndrome: Secondary | ICD-10-CM

## 2016-05-21 MED ORDER — ROPINIROLE HCL 2 MG PO TABS: 2.0000 mg | ORAL_TABLET | Freq: Every day | ORAL | 3 refills | Status: DC

## 2016-06-02 ENCOUNTER — Encounter: Payer: Self-pay | Admitting: Osteopathic Medicine

## 2016-06-25 ENCOUNTER — Other Ambulatory Visit: Payer: Self-pay | Admitting: Osteopathic Medicine

## 2016-06-25 DIAGNOSIS — G4700 Insomnia, unspecified: Secondary | ICD-10-CM

## 2016-07-19 ENCOUNTER — Other Ambulatory Visit: Payer: Self-pay | Admitting: Osteopathic Medicine

## 2016-07-19 DIAGNOSIS — G4700 Insomnia, unspecified: Secondary | ICD-10-CM

## 2016-07-22 ENCOUNTER — Encounter: Payer: Self-pay | Admitting: Osteopathic Medicine

## 2016-07-22 DIAGNOSIS — G4709 Other insomnia: Secondary | ICD-10-CM

## 2016-07-23 MED ORDER — TRAZODONE HCL 300 MG PO TABS: 300.0000 mg | ORAL_TABLET | Freq: Every day | ORAL | 5 refills | Status: DC

## 2016-07-27 ENCOUNTER — Other Ambulatory Visit: Payer: Self-pay

## 2016-07-27 DIAGNOSIS — G4709 Other insomnia: Secondary | ICD-10-CM

## 2016-07-27 MED ORDER — TRAZODONE HCL 300 MG PO TABS: 300.0000 mg | ORAL_TABLET | Freq: Every day | ORAL | 0 refills | Status: DC

## 2016-08-03 ENCOUNTER — Ambulatory Visit: Payer: 59 | Admitting: Physician Assistant

## 2016-08-06 ENCOUNTER — Encounter: Payer: 59 | Admitting: Sports Medicine

## 2016-08-13 IMAGING — US US RENAL
1 series · 14 of 25 positions shown · non-contrast
Comparison: None.

CLINICAL DATA: Proteinuria.

EXAM:
RENAL / URINARY TRACT ULTRASOUND COMPLETE

[Series 1: us renal · 0.21mm/px · 14 of 28 slices shown]
[im 1/28]
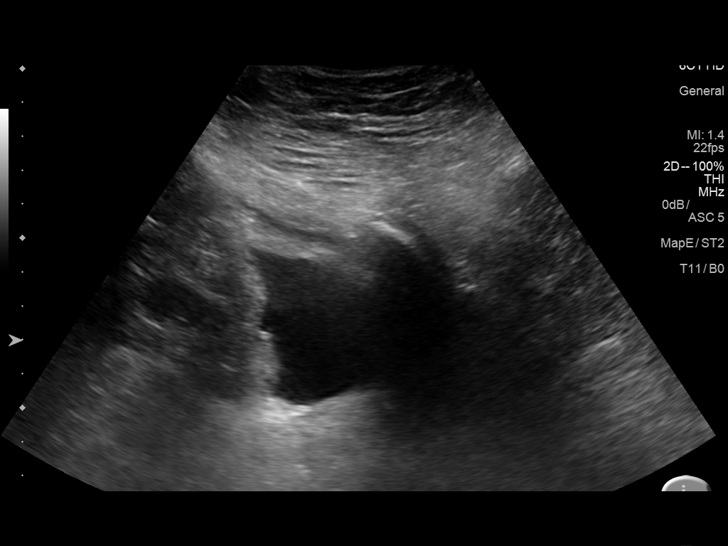
[im 3/28]
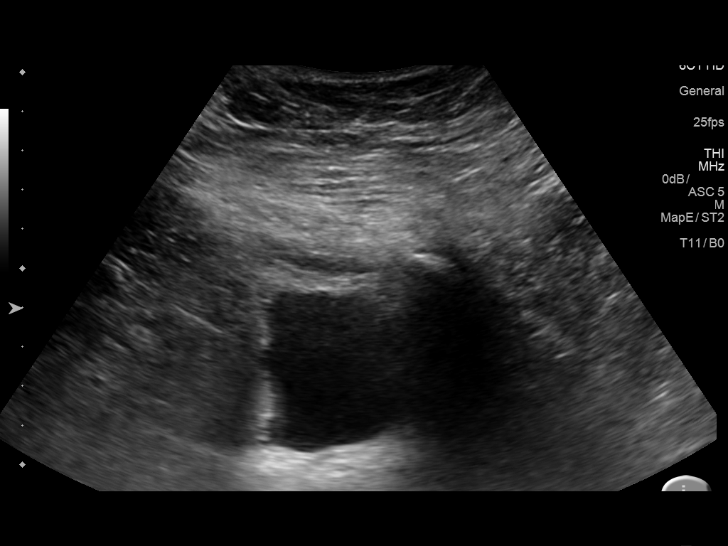
[im 5/28]
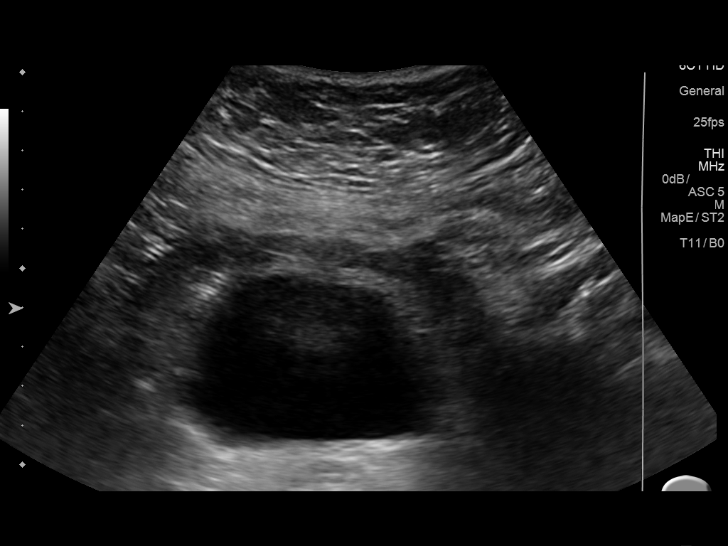
[im 7/28]
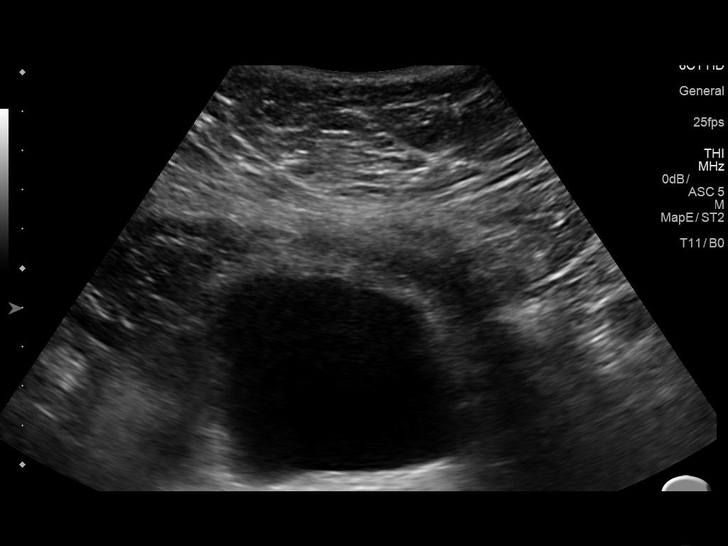
[im 10/28]
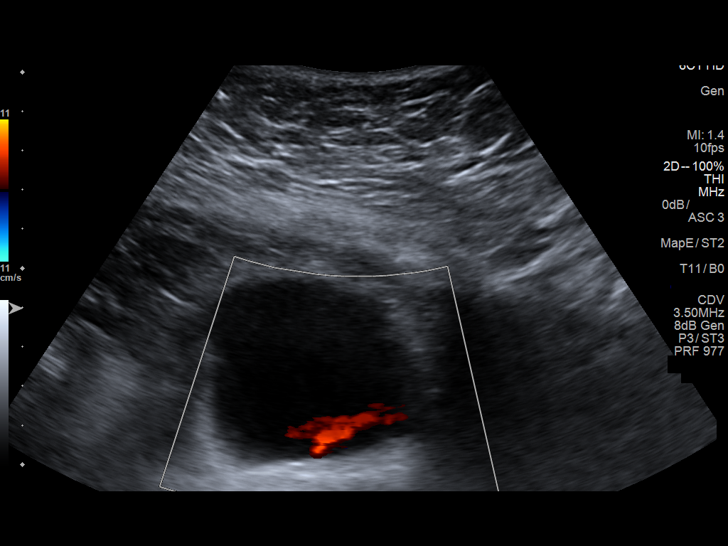
[im 11/28]
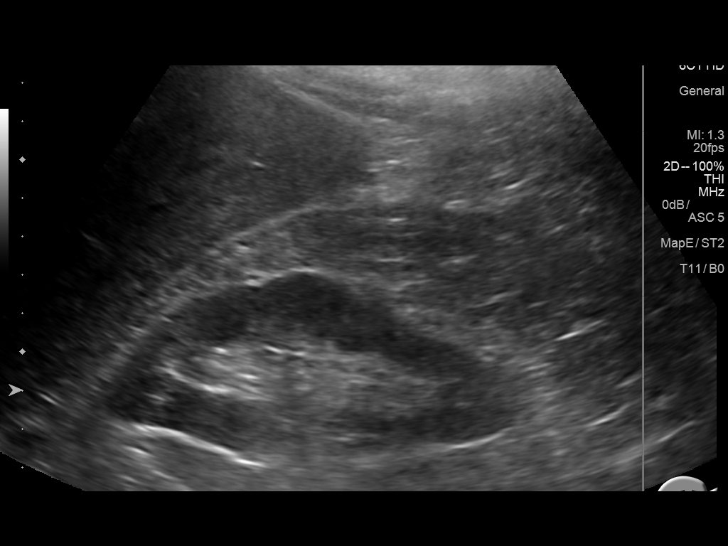
[im 13/28]
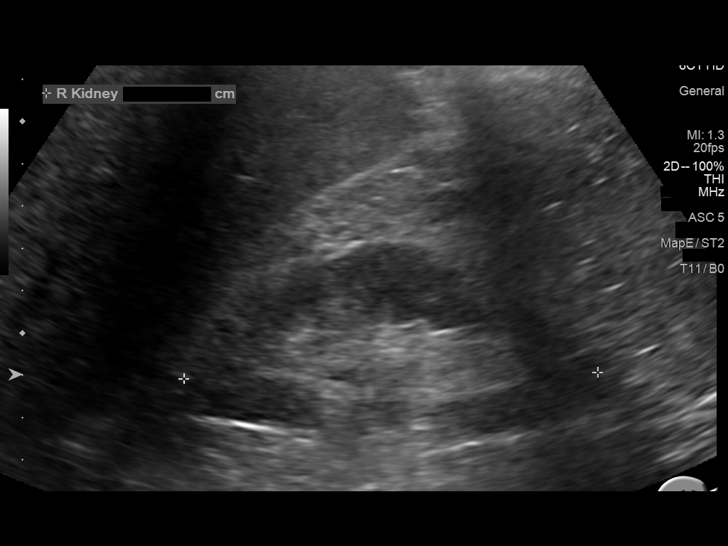
[im 15/28]
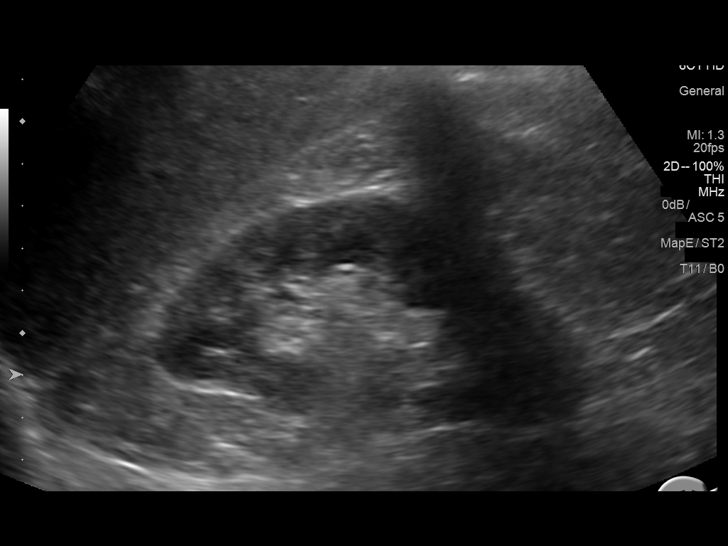
[im 17/28]
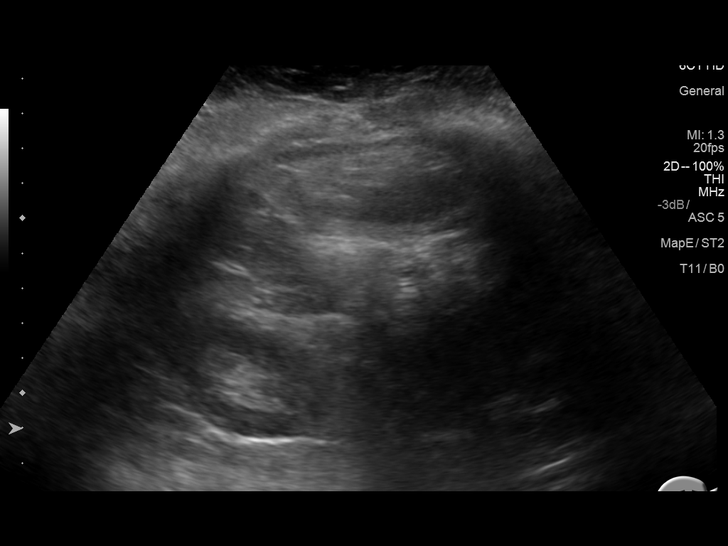
[im 19/28]
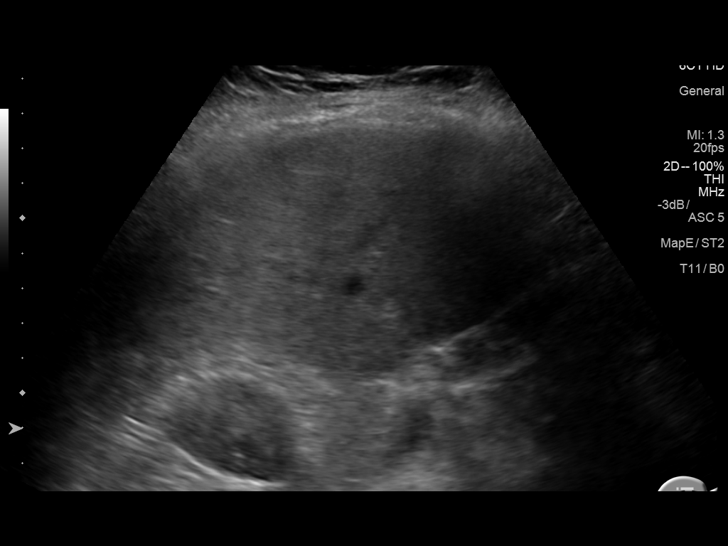
[im 21/28]
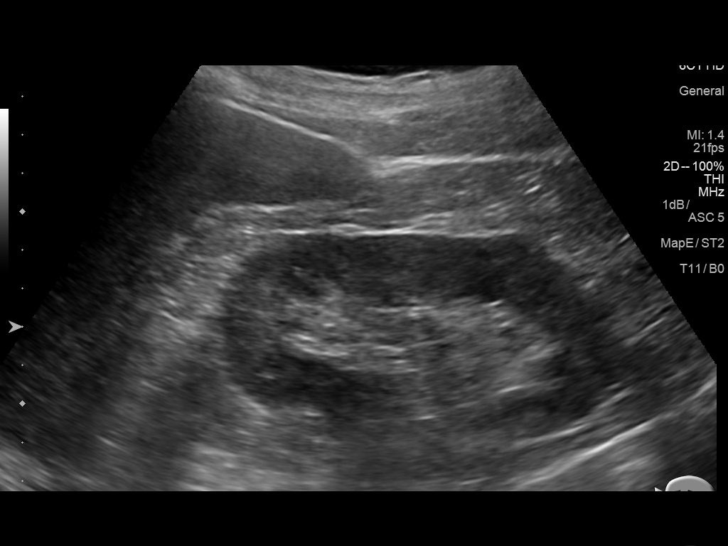
[im 23/28]
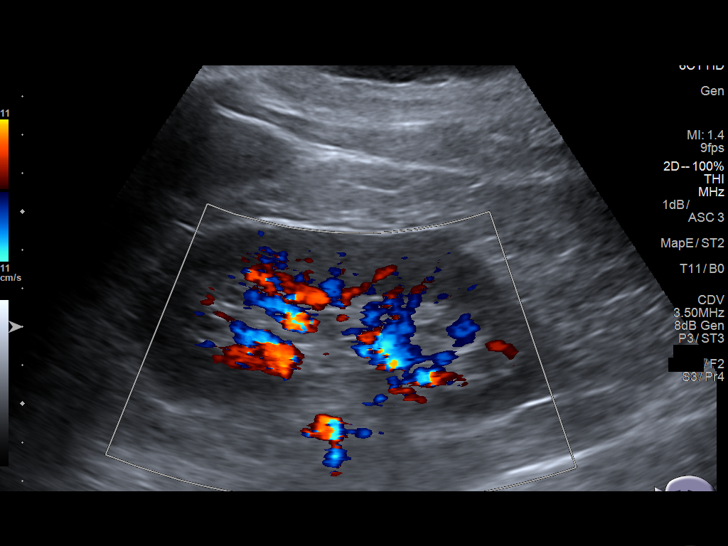
[im 25/28]
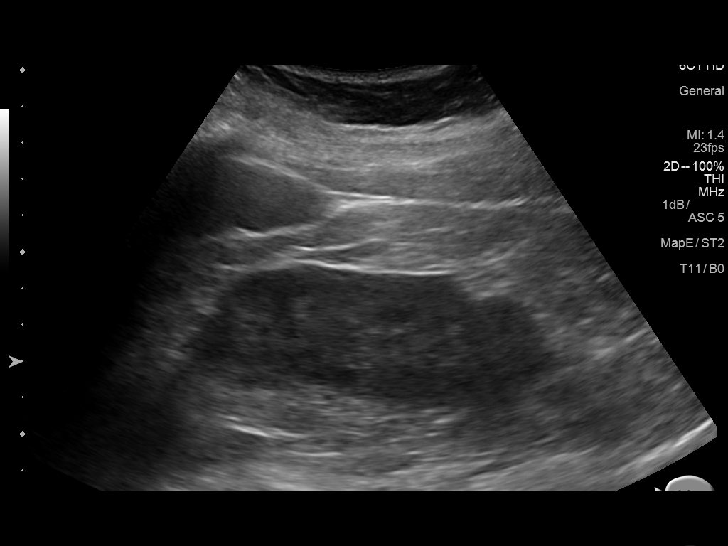
[im 28/28]
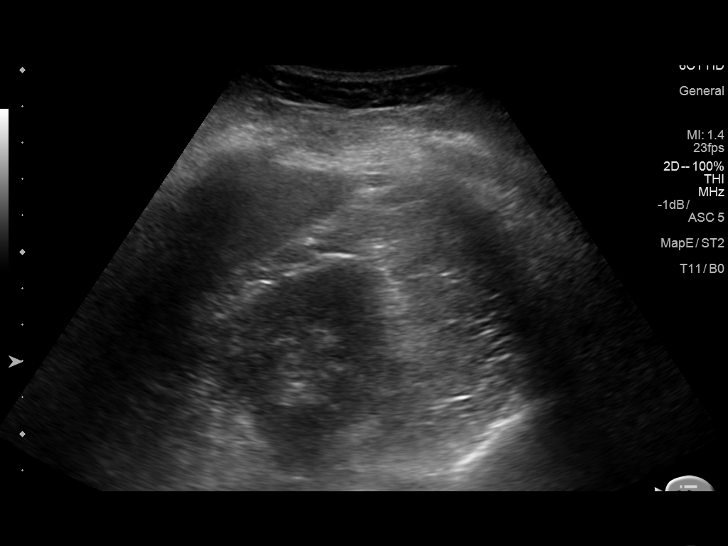

[14 of 25 positions shown; findings below may reference images not displayed]

FINDINGS: Right Kidney:

Length: 9.8 cm. Echogenicity within normal limits. No mass or
hydronephrosis visualized.

Left Kidney:

Length: 9.6 cm. Echogenicity within normal limits. No mass or
hydronephrosis visualized.

Bladder:

Appears normal for degree of bladder distention.
IMPRESSION: Normal study.  No cause for proteinuria identified.

## 2016-08-19 ENCOUNTER — Encounter: Payer: Self-pay | Admitting: Osteopathic Medicine

## 2016-08-19 ENCOUNTER — Ambulatory Visit (INDEPENDENT_AMBULATORY_CARE_PROVIDER_SITE_OTHER): Payer: 59 | Admitting: Osteopathic Medicine

## 2016-08-19 VITALS — BP 126/77 | HR 97 | Temp 96.8°F | Wt 230.0 lb

## 2016-08-19 DIAGNOSIS — R6889 Other general symptoms and signs: Secondary | ICD-10-CM | POA: Diagnosis not present

## 2016-08-19 MED ORDER — ONDANSETRON 8 MG PO TBDP: 8.0000 mg | ORAL_TABLET | Freq: Three times a day (TID) | ORAL | 1 refills | Status: DC | PRN

## 2016-08-19 MED ORDER — BENZONATATE 200 MG PO CAPS: 200.0000 mg | ORAL_CAPSULE | Freq: Three times a day (TID) | ORAL | 0 refills | Status: DC | PRN

## 2016-08-19 NOTE — Patient Instructions (Signed)

## 2016-08-19 NOTE — Progress Notes (Signed)
HPI: Rachel Rich is a 46 y.o. female who presents to [facility] 08/19/16 for chief complaint of:  Chief Complaint  Patient presents with  . Cough    Starting on Monday, started to feel sick. Initially upper respiratory congestion, involved into the thickened cough, fever up to 103 or 104, nausea with vomiting, loose stool. Has tried over-the-counter cold/flu remedies and cough medicine with little effect. No known flu exposure.   Past medical, social and family history reviewed. Current medications and allergies reviewed.     Review of Systems:  Constitutional: Yes  fever/chills  HEENT: Yes  headache, Yes  sore throat, No  swollen glands  Cardiovascular: No chest pain  Respiratory:Yes  cough, No  shortness of breath  Gastrointestinal: Yes  nausea, Yes  vomiting,  Yes  diarrhea  Musculoskeletal:   Yes  myalgia/arthralgia  Skin/Integument:  No  rash   Exam:  BP 126/77   Pulse 97   Temp (!) 96.8 F (36 C) (Oral)   Wt 230 lb (104.3 kg)   BMI 44.92 kg/m   Constitutional: VSS, see above. General Appearance: alert, well-developed, well-nourished, NAD  Eyes: Normal lids and conjunctive, non-icteric sclera, PERRLA  Ears, Nose, Mouth, Throat: Normal external inspection ears/nares/mouth/lips/gums, normal TM, MMM; posterior pharynx with erythema, without exudate, nasal mucosa normal  Neck: No masses, trachea midline. normal lymph nodes  Respiratory: Normal respiratory effort. No  wheeze/rhonchi/rales  Cardiovascular: S1/S2 normal, no murmur/rub/gallop auscultated. RRR.    ASSESSMENT/PLAN: Relatively low risk patient and outside window anyway for Tamiflu. Supportive care and symptomatic treatment as well as isolation from bone marrow pill populations was advised. Clinic is currently out of flu swabs   Flu-like symptoms - Plan: ondansetron (ZOFRAN-ODT) 8 MG disintegrating tablet, benzonatate (TESSALON) 200 MG capsule, okay to use  Imodium. Optimizing hydration/nutrition and getting plenty of rest is T     Patient Instructions  Note: the following list assumes no pregnancy, normal liver & kidney function and no other drug interactions. Dr. Sheppard Coil has highlighted medications which are safe for you to use, but these may not be appropriate for everyone. Always ask a pharmacist or qualified medical provider if there are any questions!    Aches/Pains, Fever Acetaminophen (Tylenol) 500 mg tablets - take max 2 tablets (1000 mg) every 6 hours (4 times per day)  Ibuprofen (Motrin) 200 mg tablets - take max 4 tablets (800 mg) every 6 hours  Sinus Congestion Prescription Atrovent Cromolyn Nasal Spray (NasalCrom) 1 spray each nostril 3-4 times per day, max 6 imes per day Nasal Saline if desired Oxymetolazone (Afrin, others) sparing use due to rebound congestion Phenylephrine (Sudafed) 10 mg tablets every 4 hours (or the 12-hour formulation) Diphenhydramine (Benadryl) 25 mg tablets - take max 2 tablets every 4 hours  Cough & Sore Throat Prescription cough pills or syrups Dextromethorphan (Robitussin, others) - cough suppressant Guaifenesin (Robitussin, Mucinex, others) - expectorant (helps cough up mucus) (Dextromethorphan and Guaifenesin also come in a combination tablet) Lozenges w/ Benzocaine + Menthol (Cepacol) Honey - as much as you want! Teas which "coat the throat" - look for ingredients Elm Bark, Licorice Root, Marshmallow Root  Other Zinc Lozenges within 24 hours of symptoms onset - mixed evidence this shortens the duration of the common cold Don't waste your money on Vitamin C or Echinacea       Visit summary was printed for the patient with medications and pertinent instructions for patient to review. ER/RTC precautions reviewed. All questions answered. Return  if symptoms worsen or fail to improve.

## 2016-08-30 ENCOUNTER — Encounter: Payer: Self-pay | Admitting: Osteopathic Medicine

## 2016-08-31 ENCOUNTER — Ambulatory Visit: Payer: 59 | Admitting: Osteopathic Medicine

## 2016-12-28 ENCOUNTER — Other Ambulatory Visit: Payer: Self-pay | Admitting: Osteopathic Medicine

## 2017-01-03 ENCOUNTER — Encounter: Payer: Self-pay | Admitting: Osteopathic Medicine

## 2017-01-04 ENCOUNTER — Other Ambulatory Visit: Payer: Self-pay

## 2017-01-04 DIAGNOSIS — G4709 Other insomnia: Secondary | ICD-10-CM

## 2017-01-04 DIAGNOSIS — F32A Depression, unspecified: Secondary | ICD-10-CM

## 2017-01-04 DIAGNOSIS — Z8669 Personal history of other diseases of the nervous system and sense organs: Secondary | ICD-10-CM

## 2017-01-04 DIAGNOSIS — E785 Hyperlipidemia, unspecified: Secondary | ICD-10-CM

## 2017-01-04 DIAGNOSIS — F329 Major depressive disorder, single episode, unspecified: Secondary | ICD-10-CM

## 2017-01-04 DIAGNOSIS — Z9109 Other allergy status, other than to drugs and biological substances: Secondary | ICD-10-CM

## 2017-01-04 DIAGNOSIS — G2581 Restless legs syndrome: Secondary | ICD-10-CM

## 2017-01-04 MED ORDER — TRAZODONE HCL 300 MG PO TABS: 300.0000 mg | ORAL_TABLET | Freq: Every day | ORAL | 0 refills | Status: DC

## 2017-01-04 MED ORDER — MONTELUKAST SODIUM 10 MG PO TABS: 10.0000 mg | ORAL_TABLET | Freq: Every day | ORAL | 3 refills | Status: DC

## 2017-01-04 MED ORDER — TOPIRAMATE 25 MG PO TABS: 25.0000 mg | ORAL_TABLET | Freq: Two times a day (BID) | ORAL | 0 refills | Status: DC

## 2017-01-04 MED ORDER — ARIPIPRAZOLE 5 MG PO TABS: 5.0000 mg | ORAL_TABLET | Freq: Every day | ORAL | 0 refills | Status: DC

## 2017-01-04 MED ORDER — ROSUVASTATIN CALCIUM 40 MG PO TABS: 40.0000 mg | ORAL_TABLET | Freq: Every day | ORAL | 0 refills | Status: DC

## 2017-01-04 MED ORDER — ROPINIROLE HCL 2 MG PO TABS: 2.0000 mg | ORAL_TABLET | Freq: Every day | ORAL | 0 refills | Status: DC

## 2017-01-11 ENCOUNTER — Ambulatory Visit (INDEPENDENT_AMBULATORY_CARE_PROVIDER_SITE_OTHER): Payer: 59 | Admitting: Osteopathic Medicine

## 2017-01-11 ENCOUNTER — Encounter: Payer: Self-pay | Admitting: Osteopathic Medicine

## 2017-01-11 VITALS — BP 119/78 | HR 88 | Ht 61.0 in | Wt 234.0 lb

## 2017-01-11 DIAGNOSIS — F32A Depression, unspecified: Secondary | ICD-10-CM

## 2017-01-11 DIAGNOSIS — G4709 Other insomnia: Secondary | ICD-10-CM

## 2017-01-11 DIAGNOSIS — G2581 Restless legs syndrome: Secondary | ICD-10-CM

## 2017-01-11 DIAGNOSIS — G43801 Other migraine, not intractable, with status migrainosus: Secondary | ICD-10-CM

## 2017-01-11 DIAGNOSIS — R110 Nausea: Secondary | ICD-10-CM

## 2017-01-11 DIAGNOSIS — E785 Hyperlipidemia, unspecified: Secondary | ICD-10-CM

## 2017-01-11 DIAGNOSIS — Z9109 Other allergy status, other than to drugs and biological substances: Secondary | ICD-10-CM

## 2017-01-11 DIAGNOSIS — Z8669 Personal history of other diseases of the nervous system and sense organs: Secondary | ICD-10-CM

## 2017-01-11 DIAGNOSIS — F329 Major depressive disorder, single episode, unspecified: Secondary | ICD-10-CM

## 2017-01-11 MED ORDER — SUMATRIPTAN SUCCINATE 25 MG PO TABS
25.0000 mg | ORAL_TABLET | ORAL | 6 refills | Status: DC | PRN
Start: 2017-01-11 — End: 2018-06-06

## 2017-01-11 MED ORDER — ROPINIROLE HCL 2 MG PO TABS: 2.0000 mg | ORAL_TABLET | Freq: Every day | ORAL | 3 refills | Status: DC

## 2017-01-11 MED ORDER — MONTELUKAST SODIUM 10 MG PO TABS: 10.0000 mg | ORAL_TABLET | Freq: Every day | ORAL | 3 refills | Status: DC

## 2017-01-11 MED ORDER — ONDANSETRON 8 MG PO TBDP: 8.0000 mg | ORAL_TABLET | Freq: Three times a day (TID) | ORAL | 6 refills | Status: DC | PRN

## 2017-01-11 MED ORDER — TOPIRAMATE 25 MG PO TABS: 25.0000 mg | ORAL_TABLET | Freq: Two times a day (BID) | ORAL | 3 refills | Status: DC

## 2017-01-11 MED ORDER — ROSUVASTATIN CALCIUM 40 MG PO TABS: 40.0000 mg | ORAL_TABLET | Freq: Every day | ORAL | 3 refills | Status: DC

## 2017-01-11 MED ORDER — TRAZODONE HCL 300 MG PO TABS: 300.0000 mg | ORAL_TABLET | Freq: Every day | ORAL | 3 refills | Status: DC

## 2017-01-11 MED ORDER — ARIPIPRAZOLE 5 MG PO TABS: 5.0000 mg | ORAL_TABLET | Freq: Every day | ORAL | 3 refills | Status: DC

## 2017-01-11 NOTE — Progress Notes (Signed)
HPI: Rachel Rich is a 46 y.o. female  who presents to [facility] today, 01/11/17,  for chief complaint of:  Chief Complaint  Patient presents with  . Follow-up    medication refill     Doing well on all medicines. I confirmed list with her and made updates. Last visit for routine care, she was asked to come back when due for annual (September), not sure why she was instructed to come for visit for refills and she has no complaints at this time, will send refills   Past medical history, surgical history, social history and family history reviewed.  Patient Active Problem List   Diagnosis Date Noted  . Family history of breast cancer 04/13/2016  . Abdominal pain, chronic, right lower quadrant 04/13/2016  . History of migraine 04/13/2016  . History of non anemic vitamin B12 deficiency 04/13/2016  . Postmenopausal 04/13/2016  . Abnormal weight gain 01/16/2015  . Menopause 07/25/2014  . Asthma, mild persistent 07/25/2014  . HSV infection 07/25/2014  . Bipolar disorder ([place]) 07/25/2014  . Fibroadenoma of left breast 07/25/2014  . Cystadenoma of left ovary 07/25/2014  . Rosacea 07/25/2014  . Insomnia 03/26/2014  . Environmental allergies 03/26/2014  . Fibromyalgia 03/26/2014  . RLS (restless legs syndrome) 03/26/2014  . Other migraine with status migrainosus, not intractable 03/26/2014  . Nonalcoholic hepatosteatosis 11/94/1740  . Hyperlipidemia 03/29/2010    Current medication list and allergy/intolerance information reviewed.   Current Outpatient Prescriptions on File Prior to Visit  Medication Sig Dispense Refill  . albuterol (PROVENTIL HFA;VENTOLIN HFA) 108 (90 Base) MCG/ACT inhaler Inhale 1-2 puffs into the lungs every 4 (four) hours as needed for wheezing or shortness of breath.    . ARIPiprazole (ABILIFY) 5 MG tablet Take 1 tablet (5 mg total) by mouth daily. APPOINTMENT NEEDED FOR FURTHER REFILLS 90 tablet 0  . budesonide-formoterol  (SYMBICORT) 80-4.5 MCG/ACT inhaler Inhale 2 puffs into the lungs 2 (two) times daily.    . montelukast (SINGULAIR) 10 MG tablet Take 1 tablet (10 mg total) by mouth at bedtime. 90 tablet 3  . Omega-3 Fatty Acids (EQL FISH OIL) 1000 MG CAPS One by mouth BID 90 each   . ondansetron (ZOFRAN-ODT) 8 MG disintegrating tablet Take 1 tablet (8 mg total) by mouth every 8 (eight) hours as needed for nausea. 20 tablet 1  . rOPINIRole (REQUIP) 2 MG tablet Take 1 tablet (2 mg total) by mouth at bedtime. APPOINTMENT NEEDED FOR FURTHER REFILLS 90 tablet 0  . rosuvastatin (CRESTOR) 40 MG tablet Take 1 tablet (40 mg total) by mouth daily. APPOINTMENT NEEDED FOR FURTHER REFILLS 90 tablet 0  . SUMAtriptan (IMITREX) 25 MG tablet Take 1 tablet (25 mg total) by mouth every 2 (two) hours as needed for migraine or headache. May repeat in 2 hours if headache persists. 10 tablet 4  . topiramate (TOPAMAX) 25 MG tablet Take 1 tablet (25 mg total) by mouth 2 (two) times daily. APPOINTMENT NEEDED FOR FURTHER REFILLS 180 tablet 0  . traZODone (DESYREL) 150 MG tablet Take 2 tablets (300 mg total) by mouth at bedtime. APPOINTMENT NEEDED FOR FURTHER REFILLS 60 tablet 0  . trazodone (DESYREL) 300 MG tablet Take 1 tablet (300 mg total) by mouth at bedtime. APPOINTMENT NEEDED FOR FURTHER REFILLS 90 tablet 0   No current facility-administered medications on file prior to visit.    Allergies  Allergen Reactions  . Sulphur [Sulfur] Anaphylaxis  . Shellfish-Derived Products Swelling  . Contrast Media [Iodinated  Diagnostic Agents]     Rash and vomiting  . Cortisone     Anaphylaxis (outside records) however tolerated prednisone.  Marland Kitchen Macrobid [Nitrofurantoin Monohyd Macro] Swelling  . Tetracyclines & Related     rash  . Topamax [Topiramate]     High dose caused vision problems but low dose tolerates fine   . Codeine Rash      Review of Systems:  Constitutional: No recent illness, feels well today   Exam:  BP 119/78   Pulse  88   Ht 5\' 1"  (1.549 m)   Wt 234 lb (106.1 kg)   BMI 44.21 kg/m   Constitutional: VS see above. General Appearance: alert, well-developed, well-nourished, NAD  Psychiatric: Normal judgment/insight. Normal mood and affect. Oriented x3.     ASSESSMENT/PLAN:   Hyperlipidemia, unspecified hyperlipidemia type - Plan: rosuvastatin (CRESTOR) 40 MG tablet  Nausea - Plan: ondansetron (ZOFRAN-ODT) 8 MG disintegrating tablet  History of migraine - Plan: topiramate (TOPAMAX) 25 MG tablet  Other insomnia - Plan: trazodone (DESYREL) 300 MG tablet  Depressive disorder - Plan: ARIPiprazole (ABILIFY) 5 MG tablet  Environmental allergies - Plan: montelukast (SINGULAIR) 10 MG tablet  RLS (restless legs syndrome) - Plan: rOPINIRole (REQUIP) 2 MG tablet  Other migraine with status migrainosus, not intractable - Plan: SUMAtriptan (IMITREX) 25 MG tablet      Follow-up plan: Return for annual physical in 03/2017, sooner if needed.  All questions at time of visit were answered - patient instructed to contact office with any additional concerns. ER/RTC precautions were reviewed with the patient and understanding verbalized.

## 2017-02-24 ENCOUNTER — Encounter: Payer: Self-pay | Admitting: Osteopathic Medicine

## 2017-02-24 DIAGNOSIS — Z419 Encounter for procedure for purposes other than remedying health state, unspecified: Secondary | ICD-10-CM

## 2017-02-28 LAB — CBC
HCT: 41.5 % (ref 35.0–45.0)
HEMOGLOBIN: 13.6 g/dL (ref 11.7–15.5)
MCH: 29.2 pg (ref 27.0–33.0)
MCHC: 32.8 g/dL (ref 32.0–36.0)
MCV: 89.2 fL (ref 80.0–100.0)
MPV: 11 fL (ref 7.5–12.5)
Platelets: 208 10*3/uL (ref 140–400)
RBC: 4.65 MIL/uL (ref 3.80–5.10)
RDW: 13.8 % (ref 11.0–15.0)
WBC: 6.2 10*3/uL (ref 3.8–10.8)

## 2017-02-28 LAB — COMPLETE METABOLIC PANEL WITH GFR
ALT: 68 U/L — AB (ref 6–29)
AST: 42 U/L — AB (ref 10–35)
Albumin: 4.5 g/dL (ref 3.6–5.1)
Alkaline Phosphatase: 80 U/L (ref 33–115)
BILIRUBIN TOTAL: 1.1 mg/dL (ref 0.2–1.2)
BUN: 17 mg/dL (ref 7–25)
CO2: 22 mmol/L (ref 20–32)
CREATININE: 1.34 mg/dL — AB (ref 0.50–1.10)
Calcium: 9.3 mg/dL (ref 8.6–10.2)
Chloride: 107 mmol/L (ref 98–110)
GFR, Est African American: 55 mL/min — ABNORMAL LOW (ref >=60)
GFR, Est Non African American: 48 mL/min — ABNORMAL LOW (ref >=60)
GLUCOSE: 102 mg/dL — AB (ref 65–99)
Potassium: 3.8 mmol/L (ref 3.5–5.3)
SODIUM: 141 mmol/L (ref 135–146)
TOTAL PROTEIN: 6.9 g/dL (ref 6.1–8.1)

## 2017-03-01 ENCOUNTER — Encounter: Payer: Self-pay | Admitting: Osteopathic Medicine

## 2017-03-01 LAB — ABO AND RH: RH TYPE: POSITIVE

## 2017-03-01 LAB — CP4508-PT/INR AND PTT
INR: 1
Prothrombin Time: 10.3 s (ref 9.0–11.5)
aPTT: 27 s (ref 22–34)

## 2017-03-03 ENCOUNTER — Encounter: Payer: Self-pay | Admitting: Osteopathic Medicine

## 2017-03-03 ENCOUNTER — Ambulatory Visit (INDEPENDENT_AMBULATORY_CARE_PROVIDER_SITE_OTHER): Payer: 59 | Admitting: Osteopathic Medicine

## 2017-03-03 VITALS — BP 116/79 | HR 88 | Ht 60.0 in | Wt 227.0 lb

## 2017-03-03 DIAGNOSIS — R748 Abnormal levels of other serum enzymes: Secondary | ICD-10-CM | POA: Diagnosis not present

## 2017-03-03 DIAGNOSIS — R7989 Other specified abnormal findings of blood chemistry: Secondary | ICD-10-CM | POA: Diagnosis not present

## 2017-03-03 DIAGNOSIS — Z8719 Personal history of other diseases of the digestive system: Secondary | ICD-10-CM

## 2017-03-03 DIAGNOSIS — R809 Proteinuria, unspecified: Secondary | ICD-10-CM | POA: Diagnosis not present

## 2017-03-03 LAB — COMPLETE METABOLIC PANEL WITH GFR
ALBUMIN: 4.4 g/dL (ref 3.6–5.1)
ALK PHOS: 76 U/L (ref 33–115)
ALT: 51 U/L — ABNORMAL HIGH (ref 6–29)
AST: 29 U/L (ref 10–35)
BUN: 12 mg/dL (ref 7–25)
CALCIUM: 9.3 mg/dL (ref 8.6–10.2)
CO2: 20 mmol/L (ref 20–32)
CREATININE: 1.37 mg/dL — AB (ref 0.50–1.10)
Chloride: 109 mmol/L (ref 98–110)
GFR, EST AFRICAN AMERICAN: 53 mL/min — AB (ref >=60)
GFR, Est Non African American: 46 mL/min — ABNORMAL LOW (ref >=60)
Glucose, Bld: 112 mg/dL — ABNORMAL HIGH (ref 65–99)
POTASSIUM: 4.1 mmol/L (ref 3.5–5.3)
Sodium: 141 mmol/L (ref 135–146)
Total Bilirubin: 0.8 mg/dL (ref 0.2–1.2)
Total Protein: 6.8 g/dL (ref 6.1–8.1)

## 2017-03-03 NOTE — Progress Notes (Signed)
HPI: Rachel Rich is a 46 y.o. female  who presents to [place] today, 03/03/17,  for chief complaint of:  Chief Complaint  Patient presents with  . Follow-up    LABS    Recently ordered labs at patient's request - she is getting surgery done in [place] and requested lab work be done including CBC, PT/INR, blood typing, CMP.  Abnormalities noted on labs as follows, patient is here to discuss these results in detail.  Elevated creatinine: Creatinine typically over the past few years his baseline 0.9, elevated to 1.3 on recent labs with reduced GFR  Elevated liver enzymes: ALT has been persistently mildly elevated but has been trending up over the past few years.   Past medical, surgical, social and family history reviewed: Patient Active Problem List   Diagnosis Date Noted  . Family history of breast cancer 04/13/2016  . Abdominal pain, chronic, right lower quadrant 04/13/2016  . History of migraine 04/13/2016  . History of non anemic vitamin B12 deficiency 04/13/2016  . Postmenopausal 04/13/2016  . Abnormal weight gain 01/16/2015  . Menopause 07/25/2014  . Asthma, mild persistent 07/25/2014  . HSV infection 07/25/2014  . Bipolar disorder ([place]) 07/25/2014  . Fibroadenoma of left breast 07/25/2014  . Cystadenoma of left ovary 07/25/2014  . Rosacea 07/25/2014  . Insomnia 03/26/2014  . Environmental allergies 03/26/2014  . Fibromyalgia 03/26/2014  . RLS (restless legs syndrome) 03/26/2014  . Other migraine with status migrainosus, not intractable 03/26/2014  . Nonalcoholic hepatosteatosis 28/78/6767  . Hyperlipidemia 03/29/2010   Past Surgical History:  Procedure Laterality Date  . ABDOMINAL HYSTERECTOMY  2009   masses on ovaries, endometriosis, no cancer diagnosis  . APPENDECTOMY  1979  . BREAST LUMPECTOMY Left 2010   pre-cancerous   . CESAREAN SECTION  2001,2007   x2   . GALLBLADDER SURGERY  2000  . HERNIA REPAIR  2009    ventral from c/s  . KNEE SURGERY Right    multiple procedures - arthroscopic   . TONSILLECTOMY  1976   Social History  Substance Use Topics  . Smoking status: Never Smoker  . Smokeless tobacco: Never Used  . Alcohol use No   Family History  Problem Relation Age of Onset  . Alcoholism Father   . Heart attack Father   . Hyperlipidemia Father   . Cancer Mother   . Depression Mother   . Stroke Mother   . Cancer Sister      Current medication list and allergy/intolerance information reviewed:   Current Outpatient Prescriptions  Medication Sig Dispense Refill  . albuterol (PROVENTIL HFA;VENTOLIN HFA) 108 (90 Base) MCG/ACT inhaler Inhale 1-2 puffs into the lungs every 4 (four) hours as needed for wheezing or shortness of breath.    . ARIPiprazole (ABILIFY) 5 MG tablet Take 1 tablet (5 mg total) by mouth daily. 90 tablet 3  . budesonide-formoterol (SYMBICORT) 80-4.5 MCG/ACT inhaler Inhale 2 puffs into the lungs 2 (two) times daily.    . montelukast (SINGULAIR) 10 MG tablet Take 1 tablet (10 mg total) by mouth at bedtime. 90 tablet 3  . Omega-3 Fatty Acids (EQL FISH OIL) 1000 MG CAPS One by mouth BID 90 each   . ondansetron (ZOFRAN-ODT) 8 MG disintegrating tablet Take 1 tablet (8 mg total) by mouth every 8 (eight) hours as needed for nausea. 30 tablet 6  . rOPINIRole (REQUIP) 2 MG tablet Take 1 tablet (2 mg total) by mouth at bedtime. 90 tablet 3  . rosuvastatin (  CRESTOR) 40 MG tablet Take 1 tablet (40 mg total) by mouth daily. 90 tablet 3  . SUMAtriptan (IMITREX) 25 MG tablet Take 1 tablet (25 mg total) by mouth every 2 (two) hours as needed for migraine or headache. May repeat in 2 hours if headache persists. 10 tablet 6  . topiramate (TOPAMAX) 25 MG tablet Take 1 tablet (25 mg total) by mouth 2 (two) times daily. 180 tablet 3  . trazodone (DESYREL) 300 MG tablet Take 1 tablet (300 mg total) by mouth at bedtime. (Patient taking differently: Take 75 mg by mouth at bedtime. ) 90 tablet 3    No current facility-administered medications for this visit.    Allergies  Allergen Reactions  . Sulphur [Sulfur] Anaphylaxis  . Shellfish-Derived Products Swelling  . Contrast Media [Iodinated Diagnostic Agents]     Rash and vomiting  . Cortisone     Anaphylaxis (outside records) however tolerated prednisone.  Marland Kitchen Macrobid [Nitrofurantoin Monohyd Macro] Swelling  . Tetracyclines & Related     rash  . Topamax [Topiramate]     High dose caused vision problems but low dose tolerates fine   . Codeine Rash      Review of Systems:  Constitutional:  No  fever, no chills, No recent illness.   HEENT: No  headache  Cardiac: No  chest pain, No  pressure  Respiratory:  No  shortness of breath. No  Cough  Musculoskeletal: No new myalgia/arthralgia  Skin: No  Rash  Psychiatric: No  concerns with depression, No  concerns with anxiety, No sleep problems, No mood problems  Exam:  BP 116/79   Pulse 88   Ht 5' (1.524 m)   Wt 227 lb (103 kg)   BMI 44.33 kg/m   Constitutional: VS see above. General Appearance: alert, well-developed, well-nourished, NAD  Eyes: Normal lids and conjunctive, non-icteric sclera  Ears, Nose, Mouth, Throat: MMM, Normal external inspection ears/nares/mouth/lips/gums. T  Neck: No masses, trachea midline.   Respiratory: Normal respiratory effort.   Musculoskeletal: Gait normal. No clubbing/cyanosis of digits.   Neurological: Normal balance/coordination. No tremor.  Skin: warm, dry, intact. No rash/ulcer.    Psychiatric: Normal judgment/insight. Normal mood and affect. Oriented x3.    Results for orders placed or performed in visit on 02/24/17 (from the past 72 hour(s))  CBC     Status: None   Collection Time: 02/28/17  8:28 AM  Result Value Ref Range   WBC 6.2 3.8 - 10.8 K/uL   RBC 4.65 3.80 - 5.10 MIL/uL   Hemoglobin 13.6 11.7 - 15.5 g/dL   HCT 41.5 35.0 - 45.0 %   MCV 89.2 80.0 - 100.0 fL   MCH 29.2 27.0 - 33.0 pg   MCHC 32.8 32.0 - 36.0  g/dL   RDW 13.8 11.0 - 15.0 %   Platelets 208 140 - 400 K/uL   MPV 11.0 7.5 - 12.5 fL  CP4508-PT/INR AND PTT     Status: None   Collection Time: 02/28/17  8:28 AM  Result Value Ref Range   Prothrombin Time 10.3 9.0 - 11.5 sec    Comment:   For more information on this test, go to: http://education.questdiagn[facility].com/faq/FAQ104      INR 1.0     Comment:   Reference Range                        0.9-1.1 Moderate-intensity Warfarin Therapy    2.0-3.0 Higher-intensity Warfarin Therapy      3.0-4.0  aPTT 27 22 - 34 sec    Comment:   This test has not been validated for monitoring unfractionated heparin therapy. For testing that is validated for this type of therapy, please refer to the Heparin Anti-Xa assay (test code [id number]).   For additional information, please refer to http://education.[facility].com/faq/FAQ159 (This link is being provided for informational/educational purposes only.)     ABO AND RH      Status: None   Collection Time: 02/28/17  8:28 AM  Result Value Ref Range   ABO Grouping A    Rh Type POS   COMPLETE METABOLIC PANEL WITH GFR     Status: Abnormal   Collection Time: 02/28/17  8:28 AM  Result Value Ref Range   Sodium 141 135 - 146 mmol/L   Potassium 3.8 3.5 - 5.3 mmol/L   Chloride 107 98 - 110 mmol/L   CO2 22 20 - 32 mmol/L    Comment: ** Please note change in reference range(s). **      Glucose, Bld 102 (H) 65 - 99 mg/dL   BUN 17 7 - 25 mg/dL   Creat 1.34 (H) 0.50 - 1.10 mg/dL   Total Bilirubin 1.1 0.2 - 1.2 mg/dL   Alkaline Phosphatase 80 33 - 115 U/L   AST 42 (H) 10 - 35 U/L   ALT 68 (H) 6 - 29 U/L   Total Protein 6.9 6.1 - 8.1 g/dL   Albumin 4.5 3.6 - 5.1 g/dL   Calcium 9.3 8.6 - 10.2 mg/dL   GFR, Est African American 55 (L) >=60 mL/min   GFR, Est Non African American 48 (L) >=60 mL/min    No results found.   ASSESSMENT/PLAN:   Lab results reviewed in detail   Elevated serum creatinine - Plan: COMPLETE METABOLIC PANEL  WITH GFR, Urinalysis, Routine w reflex microscopic, Microalbumin / creatinine urine ratio, Urine Culture  Elevated liver enzymes - Plan: COMPLETE METABOLIC PANEL WITH GFR  History of fatty infiltration of liver    Patient Instructions  Plan:  Recheck kidney function and liver enzymes  Depending on labs/urine testing, may be a transient abnormality or may require further workup     Visit summary with medication list and pertinent instructions was printed for patient to review. All questions at time of visit were answered - patient instructed to contact office with any additional concerns. ER/RTC precautions were reviewed with the patient. Follow-up plan: Return for weight and wound check after surgery - as needed .  Note: Total time spent 25 minutes, greater than 50% of the visit was spent face-to-face counseling and coordinating care for the following: The primary encounter diagnosis was Elevated serum creatinine. Diagnoses of Elevated liver enzymes and History of fatty infiltration of liver were also pertinent to this visit.Marland Kitchen

## 2017-03-03 NOTE — Patient Instructions (Signed)
Plan:  Recheck kidney function and liver enzymes  Depending on labs/urine testing, may be a transient abnormality or may require further workup

## 2017-03-04 LAB — URINALYSIS, MICROSCOPIC ONLY
BACTERIA UA: NONE SEEN [HPF]
Casts: NONE SEEN [LPF]
Crystals: NONE SEEN [HPF]
YEAST: NONE SEEN [HPF]

## 2017-03-04 LAB — MICROALBUMIN / CREATININE URINE RATIO
CREATININE, URINE: 228 mg/dL (ref 20–320)
MICROALB UR: 36.4 mg/dL
Microalb Creat Ratio: 160 mcg/mg creat — ABNORMAL HIGH (ref <30)

## 2017-03-04 LAB — URINALYSIS, ROUTINE W REFLEX MICROSCOPIC
BILIRUBIN URINE: NEGATIVE
Glucose, UA: NEGATIVE
KETONES UR: NEGATIVE
Leukocytes, UA: NEGATIVE
NITRITE: NEGATIVE
Specific Gravity, Urine: 1.021 (ref 1.001–1.035)
pH: 6 (ref 5.0–8.0)

## 2017-03-06 LAB — URINE CULTURE

## 2017-03-07 NOTE — Progress Notes (Signed)
Hi Haislee,  Your urine culture was inconclusive and possibly contaminated with vaginal bacteria If you are still having symptoms, it is okay to finish the antibiotic If you are still symptomatic after the antibiotic, recommend you return for a follow-up pelvic exam with your PCP Let me know if you have any questions or concerns  I would recommend getting your follow-up renal ultrasound and nephrology consult to follow-up on the protein in your urine.  Best, Evlyn Clines

## 2017-03-07 NOTE — Addendum Note (Signed)
Addended by: Narda Rutherford on: 03/07/2017 11:08 AM   Modules accepted: Orders

## 2017-03-17 ENCOUNTER — Ambulatory Visit (INDEPENDENT_AMBULATORY_CARE_PROVIDER_SITE_OTHER): Payer: 59

## 2017-03-17 DIAGNOSIS — R809 Proteinuria, unspecified: Secondary | ICD-10-CM | POA: Diagnosis not present

## 2017-03-30 ENCOUNTER — Telehealth: Payer: Self-pay | Admitting: Osteopathic Medicine

## 2017-03-30 ENCOUNTER — Encounter: Payer: Self-pay | Admitting: Osteopathic Medicine

## 2017-03-30 NOTE — Telephone Encounter (Signed)
This has been routed to PCP for review.

## 2017-03-30 NOTE — Telephone Encounter (Signed)
Okay I am not sure why she asked for Dr. Sheppard Coil

## 2017-03-30 NOTE — Telephone Encounter (Signed)
Patient called to schedule an appointment with Dr. Sheppard Coil for this week advised pt next avail appt is 04/05/17. Pt advised she needs a letter by this Friday that states she is okay physically to be released back to work. Pt has bypass surgery and has been on [place] and is trying return back to work Monday 04/04/17. Please adv

## 2017-03-30 NOTE — Telephone Encounter (Signed)
She has had bypass surgery, she needs to contact her surgeon or cardiologist for return to work paperwork

## 2017-03-30 NOTE — Telephone Encounter (Signed)
Wait, never mind, she is talking about gastric bypass. Yes, she would need an appointment for me to officially examine her and clear her to return to work. We can double book my last appointment of Thursday or Friday morning or Thursday afternoon

## 2017-03-31 ENCOUNTER — Encounter: Payer: Self-pay | Admitting: Osteopathic Medicine

## 2017-03-31 ENCOUNTER — Ambulatory Visit (INDEPENDENT_AMBULATORY_CARE_PROVIDER_SITE_OTHER): Payer: 59 | Admitting: Osteopathic Medicine

## 2017-03-31 VITALS — BP 116/75 | HR 97 | Temp 98.1°F | Wt 207.0 lb

## 2017-03-31 DIAGNOSIS — Z9884 Bariatric surgery status: Secondary | ICD-10-CM | POA: Diagnosis not present

## 2017-03-31 DIAGNOSIS — Z23 Encounter for immunization: Secondary | ICD-10-CM | POA: Diagnosis not present

## 2017-03-31 NOTE — Patient Instructions (Signed)
Labs sometime in the next week - can get today if you have time  Any questions or problems, let me know!

## 2017-03-31 NOTE — Telephone Encounter (Signed)
Spoke to [place] about calling the patient and schedule her to come in for office visit for clearance to go back to work. Rhonda Cunningham,CMA

## 2017-03-31 NOTE — Progress Notes (Signed)
HPI: Rachel Rich is a 46 y.o. female  who presents to [place] today, 03/31/17,  for chief complaint of:  Chief Complaint  Patient presents with  . release to go back to work    had surgery on 03/09/17    Recently back from [place] where she underwent gastric bypass surgery. Laparoscopic incisions overall healing well but there are one or 2 that she is a bit concerned about him and like checked out. She is able to perform all her usual activities, she has limited lifting but does not do any lifting at work. She feels good to go back to work without restrictions.  Reviewed records from operation in [place]. I know enough Spanish to know that her creatinine was still an issue at 1.5. Discharge summary was in [place], no concerns. See scanned documents.   Past medical history, surgical history, social history and family history reviewed.  Patient Active Problem List   Diagnosis Date Noted  . Family history of breast cancer 04/13/2016  . Abdominal pain, chronic, right lower quadrant 04/13/2016  . History of migraine 04/13/2016  . History of non anemic vitamin B12 deficiency 04/13/2016  . Postmenopausal 04/13/2016  . Abnormal weight gain 01/16/2015  . Menopause 07/25/2014  . Asthma, mild persistent 07/25/2014  . HSV infection 07/25/2014  . Bipolar disorder ([place]) 07/25/2014  . Fibroadenoma of left breast 07/25/2014  . Cystadenoma of left ovary 07/25/2014  . Rosacea 07/25/2014  . Insomnia 03/26/2014  . Environmental allergies 03/26/2014  . Fibromyalgia 03/26/2014  . RLS (restless legs syndrome) 03/26/2014  . Other migraine with status migrainosus, not intractable 03/26/2014  . Nonalcoholic hepatosteatosis 75/64/3329  . Hyperlipidemia 03/29/2010    Current medication list and allergy/intolerance information reviewed.   Current Outpatient Prescriptions on File Prior to Visit  Medication Sig Dispense Refill  . albuterol (PROVENTIL HFA;VENTOLIN  HFA) 108 (90 Base) MCG/ACT inhaler Inhale 1-2 puffs into the lungs every 4 (four) hours as needed for wheezing or shortness of breath.    . ARIPiprazole (ABILIFY) 5 MG tablet Take 1 tablet (5 mg total) by mouth daily. 90 tablet 3  . budesonide-formoterol (SYMBICORT) 80-4.5 MCG/ACT inhaler Inhale 2 puffs into the lungs 2 (two) times daily.    . montelukast (SINGULAIR) 10 MG tablet Take 1 tablet (10 mg total) by mouth at bedtime. 90 tablet 3  . Omega-3 Fatty Acids (EQL FISH OIL) 1000 MG CAPS One by mouth BID 90 each   . ondansetron (ZOFRAN-ODT) 8 MG disintegrating tablet Take 1 tablet (8 mg total) by mouth every 8 (eight) hours as needed for nausea. 30 tablet 6  . rOPINIRole (REQUIP) 2 MG tablet Take 1 tablet (2 mg total) by mouth at bedtime. 90 tablet 3  . rosuvastatin (CRESTOR) 40 MG tablet Take 1 tablet (40 mg total) by mouth daily. 90 tablet 3  . SUMAtriptan (IMITREX) 25 MG tablet Take 1 tablet (25 mg total) by mouth every 2 (two) hours as needed for migraine or headache. May repeat in 2 hours if headache persists. 10 tablet 6  . topiramate (TOPAMAX) 25 MG tablet Take 1 tablet (25 mg total) by mouth 2 (two) times daily. 180 tablet 3   No current facility-administered medications on file prior to visit.    Allergies  Allergen Reactions  . Sulphur [Sulfur] Anaphylaxis  . Shellfish-Derived Products Swelling  . Contrast Media [Iodinated Diagnostic Agents]     Rash and vomiting  . Cortisone     Anaphylaxis (outside records) however  tolerated prednisone.  Marland Kitchen Macrobid [Nitrofurantoin Monohyd Macro] Swelling  . Tetracyclines & Related     rash  . Topamax [Topiramate]     High dose caused vision problems but low dose tolerates fine   . Codeine Rash      Review of Systems:  Constitutional: No recent illness  HEENT: No  headache, no vision change  Cardiac: No  chest pain, No  pressure, No palpitations  Respiratory:  No  shortness of breath. No  Cough  Gastrointestinal: No  abdominal  pain, no change on bowel habits, no nausea/vomiting,  Musculoskeletal: No new myalgia/arthralgia  Skin: No  Rash, some concerns about surgical incisions  Hem/Onc: No  easy bruising/bleeding, No  abnormal lumps/bumps  Exam:  BP 116/75   Pulse 97   Temp 98.1 F (36.7 C) (Oral)   Wt 207 lb (93.9 kg)   BMI 40.43 kg/m   Constitutional: VS see above. General Appearance: alert, well-developed, well-nourished, NAD  Eyes: Normal lids and conjunctive, non-icteric sclera  Ears, Nose, Mouth, Throat: MMM, Normal external inspection ears/nares/mouth/lips/gums.  Neck: No masses, trachea midline.   Respiratory: Normal respiratory effort. no wheeze, no rhonchi, no rales  Cardiovascular: S1/S2 normal, no murmur, no rub/gallop auscultated. RRR.   Musculoskeletal: Gait normal. Symmetric and independent movement of all extremities  Abdominal: Nontender, nondistended, no palpable mass though habitus limits exam.  Neurological: Normal balance/coordination. No tremor.  Skin: warm, dry. Blood Protopic incision on the left flank is closed but scabbed and a bit stretched out. No apparent erythema or drainage. Some granulation tissue present.  Psychiatric: Normal judgment/insight. Normal mood and affect. Oriented x3.    Recent Results (from the past 2160 hour(s))  CBC     Status: None   Collection Time: 02/28/17  8:28 AM  Result Value Ref Range   WBC 6.2 3.8 - 10.8 K/uL   RBC 4.65 3.80 - 5.10 MIL/uL   Hemoglobin 13.6 11.7 - 15.5 g/dL   HCT 41.5 35.0 - 45.0 %   MCV 89.2 80.0 - 100.0 fL   MCH 29.2 27.0 - 33.0 pg   MCHC 32.8 32.0 - 36.0 g/dL   RDW 13.8 11.0 - 15.0 %   Platelets 208 140 - 400 K/uL   MPV 11.0 7.5 - 12.5 fL  CP4508-PT/INR AND PTT     Status: None   Collection Time: 02/28/17  8:28 AM  Result Value Ref Range   Prothrombin Time 10.3 9.0 - 11.5 sec    Comment:   For more information on this test, go to: http://education.[facility].com/faq/FAQ104      INR 1.0      Comment:   Reference Range                        0.9-1.1 Moderate-intensity Warfarin Therapy    2.0-3.0 Higher-intensity Warfarin Therapy      3.0-4.0      aPTT 27 22 - 34 sec    Comment:   This test has not been validated for monitoring unfractionated heparin therapy. For testing that is validated for this type of therapy, please refer to the Heparin Anti-Xa assay (test code [phone]).   For additional information, please refer to http://education.[facility].com/faq/FAQ159 (This link is being provided for informational/educational purposes only.)     ABO AND RH      Status: None   Collection Time: 02/28/17  8:28 AM  Result Value Ref Range   ABO Grouping A    Rh Type POS  COMPLETE METABOLIC PANEL WITH GFR     Status: Abnormal   Collection Time: 02/28/17  8:28 AM  Result Value Ref Range   Sodium 141 135 - 146 mmol/L   Potassium 3.8 3.5 - 5.3 mmol/L   Chloride 107 98 - 110 mmol/L   CO2 22 20 - 32 mmol/L    Comment: ** Please note change in reference range(s). **      Glucose, Bld 102 (H) 65 - 99 mg/dL   BUN 17 7 - 25 mg/dL   Creat 1.34 (H) 0.50 - 1.10 mg/dL   Total Bilirubin 1.1 0.2 - 1.2 mg/dL   Alkaline Phosphatase 80 33 - 115 U/L   AST 42 (H) 10 - 35 U/L   ALT 68 (H) 6 - 29 U/L   Total Protein 6.9 6.1 - 8.1 g/dL   Albumin 4.5 3.6 - 5.1 g/dL   Calcium 9.3 8.6 - 10.2 mg/dL   GFR, Est African American 55 (L) >=60 mL/min   GFR, Est Non African American 48 (L) >=60 mL/min  COMPLETE METABOLIC PANEL WITH GFR     Status: Abnormal   Collection Time: 03/03/17  7:40 AM  Result Value Ref Range   Sodium 141 135 - 146 mmol/L   Potassium 4.1 3.5 - 5.3 mmol/L   Chloride 109 98 - 110 mmol/L   CO2 20 20 - 32 mmol/L    Comment: ** Please note change in reference range(s). **      Glucose, Bld 112 (H) 65 - 99 mg/dL   BUN 12 7 - 25 mg/dL   Creat 1.37 (H) 0.50 - 1.10 mg/dL   Total Bilirubin 0.8 0.2 - 1.2 mg/dL   Alkaline Phosphatase 76 33 - 115 U/L   AST 29 10 - 35 U/L    ALT 51 (H) 6 - 29 U/L   Total Protein 6.8 6.1 - 8.1 g/dL   Albumin 4.4 3.6 - 5.1 g/dL   Calcium 9.3 8.6 - 10.2 mg/dL   GFR, Est African American 53 (L) >=60 mL/min   GFR, Est Non African American 46 (L) >=60 mL/min  Urinalysis, Routine w reflex microscopic     Status: Abnormal   Collection Time: 03/03/17  7:40 AM  Result Value Ref Range   Color, Urine DARK YELLOW YELLOW   APPearance TURBID (A) CLEAR   Specific Gravity, Urine 1.021 1.001 - 1.035   pH 6.0 5.0 - 8.0   Glucose, UA NEGATIVE NEGATIVE   Bilirubin Urine NEGATIVE NEGATIVE   Ketones, ur NEGATIVE NEGATIVE   Hgb urine dipstick 1+ (A) NEGATIVE   Protein, ur 2+ (A) NEGATIVE   Nitrite NEGATIVE NEGATIVE   Leukocytes, UA NEGATIVE NEGATIVE  Microalbumin / creatinine urine ratio     Status: Abnormal   Collection Time: 03/03/17  7:40 AM  Result Value Ref Range   Creatinine, Urine 228 20 - 320 mg/dL   Microalb, Ur 36.4 Not estab mg/dL    Comment: Result repeated and verified. Result confirmed by automatic dilution.    Microalb Creat Ratio 160 (H) <30 mcg/mg creat    Comment: The ADA has defined abnormalities in albumin excretion as follows:           Category           Result                            (mcg/mg creatinine)  Normal:    <30       Microalbuminuria:    30 - 299   Clinical albuminuria:    > or = 300   The ADA recommends that at least two of three specimens collected within a 3 - 6 month period be abnormal before considering a patient to be within a diagnostic category.     Urine Microscopic     Status: Abnormal   Collection Time: 03/03/17  7:40 AM  Result Value Ref Range   WBC, UA 0-5 <=5 WBC/HPF   RBC / HPF 0-2 <=2 RBC/HPF   Squamous Epithelial / LPF 10-20 (A) <=5 HPF   Bacteria, UA NONE SEEN NONE SEEN HPF   Crystals NONE SEEN NONE SEEN HPF   Casts NONE SEEN NONE SEEN LPF   Yeast NONE SEEN NONE SEEN HPF  Urine Culture     Status: None   Collection Time: 03/03/17  7:41 AM  Result Value Ref  Range   Organism ID, Bacteria      Three or more organisms present,each greater than 10,000 CFU/mL.These organisms,commonly found on external and internal genitalia,are considered to be colonizers.No further testing performed.     No results found.  No flowsheet data found.  Depression screen [facility] 2/9 03/31/2017 03/03/2017  Decreased Interest 0 0  Down, Depressed, Hopeless 0 0  PHQ - 2 Score 0 0      ASSESSMENT/PLAN:   Status post bariatric surgery - Plan: CBC, Basic metabolic panel  Need for immunization against influenza - Plan: Flu Vaccine QUAD 36+ mos IM    Patient Instructions  Labs sometime in the next week - can get today if you have time  Any questions or problems, let me know!     Follow-up plan: Return for recheck wounds if needed, otherwise when due for annual .  Visit summary with medication list and pertinent instructions was printed for patient to review, alert [place] if any changes needed. All questions at time of visit were answered - patient instructed to contact office with any additional concerns. ER/RTC precautions were reviewed with the patient and understanding verbalized.   Note: Total time spent 25 minutes, greater than 50% of the visit was spent face-to-face counseling and coordinating care for the following: The primary encounter diagnosis was Status post bariatric surgery. A diagnosis of Need for immunization against influenza was also pertinent to this visit.Marland Kitchen

## 2017-04-06 ENCOUNTER — Encounter: Payer: 59 | Admitting: Osteopathic Medicine

## 2017-04-06 ENCOUNTER — Ambulatory Visit (INDEPENDENT_AMBULATORY_CARE_PROVIDER_SITE_OTHER): Payer: 59 | Admitting: Osteopathic Medicine

## 2017-04-06 ENCOUNTER — Encounter: Payer: Self-pay | Admitting: Osteopathic Medicine

## 2017-04-06 VITALS — BP 111/75 | HR 98 | Temp 98.2°F | Ht 60.0 in | Wt 206.0 lb

## 2017-04-06 DIAGNOSIS — R112 Nausea with vomiting, unspecified: Secondary | ICD-10-CM

## 2017-04-06 DIAGNOSIS — R197 Diarrhea, unspecified: Secondary | ICD-10-CM

## 2017-04-06 MED ORDER — ONDANSETRON 8 MG PO TBDP: 8.0000 mg | ORAL_TABLET | Freq: Three times a day (TID) | ORAL | 3 refills | Status: DC | PRN

## 2017-04-06 MED ORDER — LOPERAMIDE HCL 2 MG PO TABS: ORAL_TABLET | ORAL | 3 refills | Status: DC

## 2017-04-06 NOTE — Patient Instructions (Addendum)
Advance diet slowly as noted below: clear liquid to full liquid to bland diet  Seek emergency care for vomiting blood or coffee-grounds-appearing vomit, severe pain, fever, or other concerns    Clear Liquid Diet, Adult A clear liquid diet is a diet that includes only liquids that you can see through. You may need to follow a clear liquid diet if:  You develop a medical condition right before or after you have surgery.  You were not able to eat food for a long period of time.  You had a condition that gave you diarrhea.  You are going to have an exam, such as a colonoscopy, in which instruments will be put into your body to look at parts of your digestive system.  You are going to have bowel surgery.  The usual goals of this diet are:  To rest the stomach and digestive system as much as possible.  To keep you hydrated.  To make sure you get some calories for energy.  To help you return to normal digestion.  Most people need to follow this diet for only a short period of time. What do I need to know about this diet?  A clear liquid is a liquid that you can see through when you hold it up to a light.  A clear liquid diet does not provide all the nutrients that you need. It is important to choose a variety of the liquids that are allowed on this diet. That way, you will get as many nutrients as possible.  If you are not sure whether you can have certain items, ask your health care provider. What can I have?  Water and flavored water.  Fruit juices that do not have pulp, such as cranberry juice and apple juice.  Tea and coffee without milk or cream.  Clear bouillon or broth.  Broth-based soups that have been strained.  Flavored gelatins.  Honey.  Sugar water.  Frozen ice or frozen ice pops that do not contain milk, yogurt, fruit pieces, or fruit pulp.  Clear sodas.  Clear sports drinks. The items listed above may not be a complete list of recommended liquids.  Contact your dietitian for more options. What can I not have?  Juices that have pulp.  Milk.  Cream or cream-based soups.  Yogurt. The items listed above may not be a complete list of liquids to avoid. Contact your dietitian for more information. Summary  A clear liquid diet is a diet that includes only liquids that you can see through.  The goal of this diet is to help you recover by resting your digestive system, keeping you hydrated, and providing nutrients.  Make sure to avoid liquids with milk, cream, or pulp while on this diet. This information is not intended to replace advice given to you by your health care provider. Make sure you discuss any questions you have with your health care provider. Document Released: 07/12/2005 Document Revised: 02/24/2016 Document Reviewed: 06/08/2013 Elsevier Interactive Patient Education  2018 [place].  Full Liquid Diet A full liquid diet may be used:  To help you transition from a clear liquid diet to a soft diet.  When your body is healing and can only tolerate foods that are easy to digest.  Before or after certain a procedure, test, or surgery (such as stomach or intestinal surgeries).  If you have trouble swallowing or chewing.  A full liquid diet includes fluids and foods that are liquid or will become liquid at room  temperature. The full liquid diet gives you the proteins, fluids, salts, and minerals that you need for energy. If you continue this diet for more than 72 hours, talk to your health care provider about how many calories you need to consume. If you continue the diet for more than 5 days, talk to your health care provider about taking a multivitamin or a nutritional supplement. What do I need to know about a full liquid diet?  You may have any liquid.  You may have any food that becomes a liquid at room temperature. The food is considered a liquid if it can be poured off a spoon at room temperature.  Drink one  serving of citrus or vitamin C-enriched fruit juice daily. What foods can I eat? Grains Any grain food that can be pureed in soup (such as crackers, pasta, and rice). Hot cereal (such as farina or oatmeal) that has been blended. Talk to your health care provider or dietitian about these foods. Vegetables Pulp-free tomato or vegetable juice. Vegetables pureed in soup. Fruits Fruit juice, including nectars and juices with pulp. Meats and Other Protein Sources Eggs in custard, eggnog mix, and eggs used in ice cream or pudding. Strained meats, like in baby food, may be allowed. Consult your health care provider. Dairy Milk and milk-based beverages, including milk shakes and instant breakfast mixes. Smooth yogurt. Pureed cottage cheese. Avoid these foods if they are not well tolerated. Beverages All beverages, including liquid nutritional supplements. Ask your health care provider if you can have carbonated beverages. They may not be well tolerated. Condiments Iodized salt, pepper, spices, and flavorings. Cocoa powder. Vinegar, ketchup, yellow mustard, smooth sauces (such as hollandaise, cheese sauce, or white sauce), and soy sauce. Sweets and Desserts Custard, smooth pudding. Flavored gelatin. Tapioca, junket. Plain ice cream, sherbet, fruit ices. Frozen ice pops, frozen fudge pops, pudding pops, and other frozen bars with cream. Syrups, including chocolate syrup. Sugar, honey, jelly. Fats and Oils Margarine, butter, cream, sour cream, and oils. Other Broth and cream soups. Strained, broth-based soups. The items listed above may not be a complete list of recommended foods or beverages. Contact your dietitian for more options. What foods can I not eat? Grains All breads. Grains are not allowed unless they are pureed into soup. Vegetables Vegetables are not allowed unless they are juiced, or cooked and pureed into soup. Fruits Fruits are not allowed unless they are juiced. Meats and Other  Protein Sources Any meat or fish. Cooked or raw eggs. Nut butters. Dairy Cheese. Condiments Stone ground mustards. Fats and Oils Fats that are coarse or chunky. Sweets and Desserts Ice cream or other frozen desserts that have any solids in them or on top, such as nuts, chocolate chips, and pieces of cookies. Cakes. Cookies. Candy. Others Soups with chunks or pieces in them. The items listed above may not be a complete list of foods and beverages to avoid. Contact your dietitian for more information. This information is not intended to replace advice given to you by your health care provider. Make sure you discuss any questions you have with your health care provider. Document Released: 07/12/2005 Document Revised: 12/18/2015 Document Reviewed: 05/17/2013 Elsevier Interactive Patient Education  2017 [place] Diet A bland diet consists of foods that do not have a lot of fat or fiber. Foods without fat or fiber are easier for the body to digest. They are also less likely to irritate your mouth, throat, stomach, and other parts of your  gastrointestinal tract. A bland diet is sometimes called a BRAT diet. What is my plan? Your health care provider or dietitian may recommend specific changes to your diet to prevent and treat your symptoms, such as:  Eating small meals often.  Cooking food until it is soft enough to chew easily.  Chewing your food well.  Drinking fluids slowly.  Not eating foods that are very spicy, sour, or fatty.  Not eating citrus fruits, such as oranges and grapefruit.  What do I need to know about this diet?  Eat a variety of foods from the bland diet food list.  Do not follow a bland diet longer than you have to.  Ask your health care provider whether you should take vitamins. What foods can I eat? Grains  Hot cereals, such as cream of wheat. Bread, crackers, or tortillas made from refined white flour. Rice. Vegetables Canned or cooked  vegetables. Mashed or boiled potatoes. Fruits Bananas. Applesauce. Other types of cooked or canned fruit with the skin and seeds removed, such as canned peaches or pears. Meats and Other Protein Sources Scrambled eggs. Creamy peanut butter or other nut butters. Lean, well-cooked meats, such as chicken or fish. Tofu. Soups or broths. Dairy Low-fat dairy products, such as milk, cottage cheese, or yogurt. Beverages Water. Herbal tea. Apple juice. Sweets and Desserts Pudding. Custard. Fruit gelatin. Ice cream. Fats and Oils Mild salad dressings. [place] or olive oil. The items listed above may not be a complete list of allowed foods or beverages. Contact your dietitian for more options. What foods are not recommended? Foods and ingredients that are often not recommended include:  Spicy foods, such as hot sauce or salsa.  Fried foods.  Sour foods, such as pickled or fermented foods.  Raw vegetables or fruits, especially citrus or berries.  Caffeinated drinks.  Alcohol.  Strongly flavored seasonings or condiments.  The items listed above may not be a complete list of foods and beverages that are not allowed. Contact your dietitian for more information. This information is not intended to replace advice given to you by your health care provider. Make sure you discuss any questions you have with your health care provider. Document Released: 11/03/2015 Document Revised: 12/18/2015 Document Reviewed: 07/24/2014 Elsevier Interactive Patient Education  2018 [employer].

## 2017-04-06 NOTE — Progress Notes (Signed)
HPI: Rachel Rich is a 46 y.o. female  who presents to [facility] today, 04/06/17,  for chief complaint of:  Chief Complaint  Patient presents with  . Diarrhea  . Nausea    . Location/Quality: loose stool and nausea, Unable to keep down solid foods . Duration/Timing: Started yesterday at work . Assoc signs/symptoms: fever at home 101, normal temp now but has had Tylenol recently   Context: recent gastric bypass surgery. Needs note to go back to work at some point since illness was witnessed there. Son recently also sick with similar viral gastritis type symptoms.    Past medical, surgical, social and family history reviewed: Patient Active Problem List   Diagnosis Date Noted  . Family history of breast cancer 04/13/2016  . Abdominal pain, chronic, right lower quadrant 04/13/2016  . History of migraine 04/13/2016  . History of non anemic vitamin B12 deficiency 04/13/2016  . Postmenopausal 04/13/2016  . Abnormal weight gain 01/16/2015  . Menopause 07/25/2014  . Asthma, mild persistent 07/25/2014  . HSV infection 07/25/2014  . Bipolar disorder ([place]) 07/25/2014  . Fibroadenoma of left breast 07/25/2014  . Cystadenoma of left ovary 07/25/2014  . Rosacea 07/25/2014  . Insomnia 03/26/2014  . Environmental allergies 03/26/2014  . Fibromyalgia 03/26/2014  . RLS (restless legs syndrome) 03/26/2014  . Other migraine with status migrainosus, not intractable 03/26/2014  . Nonalcoholic hepatosteatosis 42/35/3614  . Hyperlipidemia 03/29/2010   Past Surgical History:  Procedure Laterality Date  . ABDOMINAL HYSTERECTOMY  2009   masses on ovaries, endometriosis, no cancer diagnosis  . APPENDECTOMY  1979  . BREAST LUMPECTOMY Left 2010   pre-cancerous   . CESAREAN SECTION  2001,2007   x2   . GALLBLADDER SURGERY  2000  . HERNIA REPAIR  2009   ventral from c/s  . KNEE SURGERY Right    multiple procedures - arthroscopic   . TONSILLECTOMY  1976    Social History  Substance Use Topics  . Smoking status: Never Smoker  . Smokeless tobacco: Never Used  . Alcohol use No   Family History  Problem Relation Age of Onset  . Alcoholism Father   . Heart attack Father   . Hyperlipidemia Father   . Cancer Mother   . Depression Mother   . Stroke Mother   . Cancer Sister      Current medication list and allergy/intolerance information reviewed:   Current Outpatient Prescriptions  Medication Sig Dispense Refill  . albuterol (PROVENTIL HFA;VENTOLIN HFA) 108 (90 Base) MCG/ACT inhaler Inhale 1-2 puffs into the lungs every 4 (four) hours as needed for wheezing or shortness of breath.    . ARIPiprazole (ABILIFY) 5 MG tablet Take 1 tablet (5 mg total) by mouth daily. 90 tablet 3  . budesonide-formoterol (SYMBICORT) 80-4.5 MCG/ACT inhaler Inhale 2 puffs into the lungs 2 (two) times daily.    . montelukast (SINGULAIR) 10 MG tablet Take 1 tablet (10 mg total) by mouth at bedtime. 90 tablet 3  . Omega-3 Fatty Acids (EQL FISH OIL) 1000 MG CAPS One by mouth BID 90 each   . ondansetron (ZOFRAN-ODT) 8 MG disintegrating tablet Take 1 tablet (8 mg total) by mouth every 8 (eight) hours as needed for nausea. 30 tablet 6  . rOPINIRole (REQUIP) 2 MG tablet Take 1 tablet (2 mg total) by mouth at bedtime. 90 tablet 3  . rosuvastatin (CRESTOR) 40 MG tablet Take 1 tablet (40 mg total) by mouth daily. 90 tablet 3  . SUMAtriptan (  IMITREX) 25 MG tablet Take 1 tablet (25 mg total) by mouth every 2 (two) hours as needed for migraine or headache. May repeat in 2 hours if headache persists. 10 tablet 6  . topiramate (TOPAMAX) 25 MG tablet Take 1 tablet (25 mg total) by mouth 2 (two) times daily. 180 tablet 3   No current facility-administered medications for this visit.    Allergies  Allergen Reactions  . Sulphur [Sulfur] Anaphylaxis  . Shellfish-Derived Products Swelling  . Contrast Media [Iodinated Diagnostic Agents]     Rash and vomiting  . Cortisone      Anaphylaxis (outside records) however tolerated prednisone.  Marland Kitchen Macrobid [Nitrofurantoin Monohyd Macro] Swelling  . Tetracyclines & Related     rash  . Topamax [Topiramate]     High dose caused vision problems but low dose tolerates fine   . Codeine Rash      Review of Systems:  Constitutional:  +fever, no chills, No unintentional weight changes.   HEENT: No  headache, no vision change, no hearing change, No sore throat, +sinus pressure  Cardiac: No  chest pain, No  pressure, No palpitations,  Respiratory:  No  shortness of breath. No  Cough  Gastrointestinal: +nonlocalized abdominal pain, no bloody emesis or coffee-ground emesis, +nausea, +vomiting,  No  blood in stool, +diarrhea, No  constipation   Musculoskeletal: No new myalgia/arthralgia  Skin: No  Rash  Neurologic: +generalized weakness,    Exam:  BP 111/75   Pulse 98   Temp 98.2 F (36.8 C) (Oral)   Ht 5' (1.524 m)   Wt 206 lb (93.4 kg)   BMI 40.23 kg/m   Constitutional: VS see above. General Appearance: alert, well-developed, well-nourished, NAD  Eyes: Normal lids and conjunctive, non-icteric sclera  Ears, Nose, Mouth, Throat: MMM, Normal external inspection ears/nares/mouth/lips/gums. TM normal bilaterally. Pharynx/tonsils no erythema, no exudate. Nasal mucosa normal.   Neck: No masses, trachea midline. No thyroid enlargement. No tenderness/mass appreciated. No lymphadenopathy  Respiratory: Normal respiratory effort. no wheeze, no rhonchi, no rales  Cardiovascular: S1/S2 normal, no murmur, no rub/gallop auscultated. RRR. No lower extremity edema.   Gastrointestinal: +TTP RLQ and LLQ no rebound/guarding, no masses. No hepatomegaly, no splenomegaly. No hernia appreciated. Bowel sounds normal. Rectal exam deferred. Nondistended.   Musculoskeletal: Gait normal. No clubbing/cyanosis of digits.   Neurological: Normal balance/coordination. No tremor.    Skin: warm, dry, intact except for healing laparoscopic  incisions on abdomen. No other rash/ulcer. No concerning nevi or subq nodules on limited exam.    Psychiatric: Normal judgment/insight. Normal mood and affect. Oriented x3.      ASSESSMENT/PLAN:   Symptoms more likely consistent with viral gastritis as opposed to something more serious like postoperative complications though this is of course in the differential. Patient has been tolerating diet well after surgery up until now. No localized abdominal pain or distention to suggest perforation or abscess. Well-appearing on exam today.   Would consider labs/CT if no better in the next day or 2 or if worse. For now given sick contacts reasonable to treat symptomatically and monitor closely. Patient is agreeable to this plan and ER/RTC precautions are reviewed in detail with her  Nausea vomiting and diarrhea - Plan: ondansetron (ZOFRAN-ODT) 8 MG disintegrating tablet, loperamide (IMODIUM A-D) 2 MG tablet    Patient Instructions  Advance diet slowly as noted below: clear liquid to full liquid to bland diet  Seek emergency care for vomiting blood or coffee-grounds-appearing vomit, severe pain, fever, or other concerns  Visit summary with medication list and pertinent instructions was printed for patient to review. All questions at time of visit were answered - patient instructed to contact office with any additional concerns. ER/RTC precautions were reviewed with the patient. Follow-up plan: Return if symptoms worsen or fail to improve next 2-3 days .  Note: Total time spent 25 minutes, greater than 50% of the visit was spent face-to-face counseling and coordinating care for the following: The primary encounter diagnosis was Nausea vomiting and diarrhea. A diagnosis of Nausea was also pertinent to this visit.Marland Kitchen

## 2017-04-26 ENCOUNTER — Encounter: Payer: Self-pay | Admitting: Osteopathic Medicine

## 2017-05-05 ENCOUNTER — Encounter: Payer: Self-pay | Admitting: Osteopathic Medicine

## 2017-05-05 ENCOUNTER — Ambulatory Visit (INDEPENDENT_AMBULATORY_CARE_PROVIDER_SITE_OTHER): Payer: 59 | Admitting: Osteopathic Medicine

## 2017-05-05 VITALS — BP 96/66 | HR 91 | Ht 61.0 in | Wt 196.0 lb

## 2017-05-05 DIAGNOSIS — K432 Incisional hernia without obstruction or gangrene: Secondary | ICD-10-CM

## 2017-05-05 NOTE — Patient Instructions (Signed)

## 2017-05-05 NOTE — Progress Notes (Signed)
HPI: Rachel Rich is a 46 y.o. female  who presents to [facility] today, 05/05/17,  for chief complaint of:  Chief Complaint  Patient presents with  . Hernia     . Context: s/p c-section some time ago, s/p laparoscopic gastric bypass in [place] few months ago.  . Incisional - laparoscopic in right upper quadrant and at old C-section incision on the right . Bulging, no strangulated-type pain. History of bowel resection due to strangulated hernia in the past. . Getting worse over the past few months, bothers her when going to the bathroom in particular at the site of the old C-section scar . No fever or chills, no bowel habit changes, no persistent pain    Past medical history, surgical history, social history and family history reviewed.  Patient Active Problem List   Diagnosis Date Noted  . Family history of breast cancer 04/13/2016  . Abdominal pain, chronic, right lower quadrant 04/13/2016  . History of migraine 04/13/2016  . History of non anemic vitamin B12 deficiency 04/13/2016  . Postmenopausal 04/13/2016  . Abnormal weight gain 01/16/2015  . Menopause 07/25/2014  . Asthma, mild persistent 07/25/2014  . HSV infection 07/25/2014  . Bipolar disorder ([place]) 07/25/2014  . Fibroadenoma of left breast 07/25/2014  . Cystadenoma of left ovary 07/25/2014  . Rosacea 07/25/2014  . Insomnia 03/26/2014  . Environmental allergies 03/26/2014  . Fibromyalgia 03/26/2014  . RLS (restless legs syndrome) 03/26/2014  . Other migraine with status migrainosus, not intractable 03/26/2014  . Nonalcoholic hepatosteatosis 60/04/9322  . Hyperlipidemia 03/29/2010    Current medication list and allergy/intolerance information reviewed.   Current Outpatient Prescriptions on File Prior to Visit  Medication Sig Dispense Refill  . albuterol (PROVENTIL HFA;VENTOLIN HFA) 108 (90 Base) MCG/ACT inhaler Inhale 1-2 puffs into the lungs every 4 (four) hours as needed  for wheezing or shortness of breath.    . ARIPiprazole (ABILIFY) 5 MG tablet Take 1 tablet (5 mg total) by mouth daily. 90 tablet 3  . budesonide-formoterol (SYMBICORT) 80-4.5 MCG/ACT inhaler Inhale 2 puffs into the lungs 2 (two) times daily.    Marland Kitchen loperamide (IMODIUM A-D) 2 MG tablet Take 1-2 tablet (2-4 mg total) po qid prn severe diarrhea. Max 12 mg (6 pills) per day. 90 tablet 3  . montelukast (SINGULAIR) 10 MG tablet Take 1 tablet (10 mg total) by mouth at bedtime. 90 tablet 3  . Omega-3 Fatty Acids (EQL FISH OIL) 1000 MG CAPS One by mouth BID 90 each   . ondansetron (ZOFRAN-ODT) 8 MG disintegrating tablet Take 1 tablet (8 mg total) by mouth every 8 (eight) hours as needed for nausea (Take prior to meals). 20 tablet 3  . rOPINIRole (REQUIP) 2 MG tablet Take 1 tablet (2 mg total) by mouth at bedtime. 90 tablet 3  . rosuvastatin (CRESTOR) 40 MG tablet Take 1 tablet (40 mg total) by mouth daily. 90 tablet 3  . SUMAtriptan (IMITREX) 25 MG tablet Take 1 tablet (25 mg total) by mouth every 2 (two) hours as needed for migraine or headache. May repeat in 2 hours if headache persists. 10 tablet 6  . topiramate (TOPAMAX) 25 MG tablet Take 1 tablet (25 mg total) by mouth 2 (two) times daily. 180 tablet 3   No current facility-administered medications on file prior to visit.    Allergies  Allergen Reactions  . Sulphur [Sulfur] Anaphylaxis  . Shellfish-Derived Products Swelling  . Contrast Media [Iodinated Diagnostic Agents]     Rash  and vomiting  . Cortisone     Anaphylaxis (outside records) however tolerated prednisone.  Marland Kitchen Macrobid [Nitrofurantoin Monohyd Macro] Swelling  . Tetracyclines & Related     rash  . Topamax [Topiramate]     High dose caused vision problems but low dose tolerates fine   . Codeine Rash      Review of Systems:  Constitutional: No recent illness, no fever/chills  Cardiac: No  chest pain  Respiratory:  No  shortness of breath.   Gastrointestinal: No  abdominal  pain, no change on bowel habits, +bulging as per HPI   Hem/Onc: No  easy bruising/bleeding   Exam:  BP 96/66   Pulse 91   Ht 5\' 1"  (1.549 m)   Wt 196 lb (88.9 kg)   BMI 37.03 kg/m   Constitutional: VS see above. General Appearance: alert, well-developed, well-nourished, NAD  Neck: No masses, trachea midline.   Respiratory: Normal respiratory effort.   Musculoskeletal: Gait normal.   Abdominal: Bulging palpable with straining at right upper quadrant incisional scar, difficult to palpate due to body habitus at previous C-section scar on right but patient reports bulging feeling in that area as well.  Neurological: Normal balance/coordination. No tremor.  Skin: warm, dry, intact.   Psychiatric: Normal judgment/insight. Normal mood and affect. Oriented x3.        ASSESSMENT/PLAN: Requests referral to [facility] system to discuss repair of hernias. Referral placed. Patient to contact our office if she does not hear back within a week about an appointment.  Incisional hernia, without obstruction or gangrene - Plan: Ambulatory referral to General Surgery     Follow-up plan: Return if symptoms worsen or fail to improve.  Visit summary with medication list and pertinent instructions was printed for patient to review, alert [place] if any changes needed. All questions at time of visit were answered - patient instructed to contact office with any additional concerns. ER/RTC precautions were reviewed with the patient and understanding verbalized.   Note: Total time spent 15 minutes, greater than 50% of the visit was spent face-to-face counseling and coordinating care for the following: The encounter diagnosis was Incisional hernia, without obstruction or gangrene.Marland Kitchen

## 2017-06-22 ENCOUNTER — Telehealth: Payer: Self-pay | Admitting: Emergency Medicine

## 2017-06-22 NOTE — Telephone Encounter (Signed)
Requesting medical history records 03-09-16 through 03-09-17 as pre-op requirement with [employer].

## 2017-06-22 NOTE — Telephone Encounter (Signed)
Routing to medical records

## 2017-06-23 NOTE — Telephone Encounter (Signed)
Thank you. Patient will be coming by today to collect medical records.

## 2017-07-27 ENCOUNTER — Ambulatory Visit: Payer: 59 | Admitting: Osteopathic Medicine

## 2017-07-27 ENCOUNTER — Encounter: Payer: Self-pay | Admitting: Osteopathic Medicine

## 2017-07-27 VITALS — BP 116/76 | HR 67 | Temp 98.2°F | Wt 171.1 lb

## 2017-07-27 DIAGNOSIS — L0292 Furuncle, unspecified: Secondary | ICD-10-CM | POA: Diagnosis not present

## 2017-07-27 DIAGNOSIS — Z9884 Bariatric surgery status: Secondary | ICD-10-CM | POA: Diagnosis not present

## 2017-07-27 NOTE — Progress Notes (Signed)
HPI: Rachel Rich is a 47 y.o. female who  has a past medical history of Asthma, mild persistent (07/25/2014), Fibromyalgia (03/26/2014), Hyperlipidemia, Menopause (07/25/2014), Other migraine with status migrainosus, not intractable (03/26/2014), RLS (restless legs syndrome) (03/26/2014), and Rosacea (07/25/2014).  she presents to [facility] today, 07/27/17,  for chief complaint of: skin issue   Concern for boil on mons pubis, this ruptured over the weekend, is doing better at this time but still would like to get it checked out.   [place]. Concerned about nutrient deficiencies given recent development of fatigue, hair loss.    Past medical, surgical, social and family history reviewed:  Patient Active Problem List   Diagnosis Date Noted  . Family history of breast cancer 04/13/2016  . Abdominal pain, chronic, right lower quadrant 04/13/2016  . History of migraine 04/13/2016  . History of non anemic vitamin B12 deficiency 04/13/2016  . Postmenopausal 04/13/2016  . Abnormal weight gain 01/16/2015  . Menopause 07/25/2014  . Asthma, mild persistent 07/25/2014  . HSV infection 07/25/2014  . Bipolar disorder ([place]) 07/25/2014  . Fibroadenoma of left breast 07/25/2014  . Cystadenoma of left ovary 07/25/2014  . Rosacea 07/25/2014  . Insomnia 03/26/2014  . Environmental allergies 03/26/2014  . Fibromyalgia 03/26/2014  . RLS (restless legs syndrome) 03/26/2014  . Other migraine with status migrainosus, not intractable 03/26/2014  . Nonalcoholic hepatosteatosis 96/10/5407  . Hyperlipidemia 03/29/2010    Past Surgical History:  Procedure Laterality Date  . ABDOMINAL HYSTERECTOMY  2009   masses on ovaries, endometriosis, no cancer diagnosis  . APPENDECTOMY  1979  . BREAST LUMPECTOMY Left 2010   pre-cancerous   . CESAREAN SECTION  2001,2007   x2   . GALLBLADDER SURGERY  2000  . HERNIA REPAIR  2009   ventral  from c/s  . KNEE SURGERY Right    multiple procedures - arthroscopic   . TONSILLECTOMY  1976    Social History   Tobacco Use  . Smoking status: Never Smoker  . Smokeless tobacco: Never Used  Substance Use Topics  . Alcohol use: No    Family History  Problem Relation Age of Onset  . Alcoholism Father   . Heart attack Father   . Hyperlipidemia Father   . Cancer Mother   . Depression Mother   . Stroke Mother   . Cancer Sister      Current medication list and allergy/intolerance information reviewed:    Current Outpatient Medications  Medication Sig Dispense Refill  . albuterol (PROVENTIL HFA;VENTOLIN HFA) 108 (90 Base) MCG/ACT inhaler Inhale 1-2 puffs into the lungs every 4 (four) hours as needed for wheezing or shortness of breath.    . ARIPiprazole (ABILIFY) 5 MG tablet Take 1 tablet (5 mg total) by mouth daily. 90 tablet 3  . budesonide-formoterol (SYMBICORT) 80-4.5 MCG/ACT inhaler Inhale 2 puffs into the lungs 2 (two) times daily.    Marland Kitchen loperamide (IMODIUM A-D) 2 MG tablet Take 1-2 tablet (2-4 mg total) po qid prn severe diarrhea. Max 12 mg (6 pills) per day. 90 tablet 3  . montelukast (SINGULAIR) 10 MG tablet Take 1 tablet (10 mg total) by mouth at bedtime. 90 tablet 3  . Omega-3 Fatty Acids (EQL FISH OIL) 1000 MG CAPS One by mouth BID 90 each   . ondansetron (ZOFRAN-ODT) 8 MG disintegrating tablet Take 1 tablet (8 mg total) by mouth every 8 (eight) hours as needed for nausea (Take prior to meals). [place]  tablet 3  . rOPINIRole (REQUIP) 2 MG tablet Take 1 tablet (2 mg total) by mouth at bedtime. 90 tablet 3  . rosuvastatin (CRESTOR) 40 MG tablet Take 1 tablet (40 mg total) by mouth daily. 90 tablet 3  . SUMAtriptan (IMITREX) 25 MG tablet Take 1 tablet (25 mg total) by mouth every 2 (two) hours as needed for migraine or headache. May repeat in 2 hours if headache persists. 10 tablet 6   No current facility-administered medications for this visit.     Allergies  Allergen  Reactions  . Sulphur [Sulfur] Anaphylaxis  . Shellfish-Derived Products Swelling  . Contrast Media [Iodinated Diagnostic Agents]     Rash and vomiting  . Cortisone     Anaphylaxis (outside records) however tolerated prednisone.  Marland Kitchen Macrobid [Nitrofurantoin Monohyd Macro] Swelling  . Tetracyclines & Related     rash  . Topamax [Topiramate]     High dose caused vision problems but low dose tolerates fine   . Codeine Rash      Review of Systems:  Constitutional:  No  fever, no chills, No recent illness, +intentional weight loss   HEENT: No  headache, no vision change,  Cardiac: No  chest pain, No  pressure  Respiratory:  No  shortness of breath. No  Cough  Gastrointestinal: No  abdominal pain, No  nausea  Musculoskeletal: No new myalgia/arthralgia  Genitourinary: No  incontinence, No  abnormal genital bleeding, No abnormal genital discharge  Skin: +Rash  Endocrine: No cold intolerance,  No heat intolerance. No polyuria/polydipsia/polyphagia, +hair loss  Neurologic: No  weakness, No  dizziness,    Exam:  BP 116/76   Pulse 67   Temp 98.2 F (36.8 C) (Oral)   Wt 171 lb 1.6 oz (77.6 kg)   BMI 32.33 kg/m   Constitutional: VS see above. General Appearance: alert, well-developed, well-nourished, NAD  Eyes: Normal lids and conjunctive, non-icteric sclera  Ears, Nose, Mouth, Throat: MMM, Normal external inspection ears/nares/mouth/lips/gums.   Neck: No masses, trachea midline.   Respiratory: Normal respiratory effort. no wheeze, no rhonchi, no rales  Cardiovascular: S1/S2 normal, no murmur, no rub/gallop auscultated. RRR. No lower extremity edema.   Musculoskeletal: Gait normal.   Neurological: Normal balance/coordination. No tremor.   Skin: warm, dry, healing area on L mons pubis c/w healing abscess , no erythema/drainge, no ulceration, nontender    Psychiatric: Normal judgment/insight. Normal mood and affect. Oriented x3.     ASSESSMENT/PLAN:   Boil -  healing well, advised avoid shaving   Status post bariatric surgery - Plan: CBC with Differential/Platelet, Ferritin, Folate, Lipid panel, Parathyroid hormone, intact (no Ca), VITAMIN D 25 Hydroxy (Vit-D Deficiency, Fractures), Vitamin B1, Vitamin B12, Zinc, Fe+TIBC+Fer, Copper, serum, COMPLETE METABOLIC PANEL WITH GFR, TSH     Visit summary with medication list and pertinent instructions was printed for patient to review. All questions at time of visit were answered - patient instructed to contact office with any additional concerns. ER/RTC precautions were reviewed with the patient.   Follow-up plan: Return for annual physical when due - if labs are ok recheck in 6-12 mos .    Please note: voice recognition software was used to produce this document, and typos may escape review. Please contact Dr. Sheppard Coil for any needed clarifications.

## 2017-07-28 ENCOUNTER — Other Ambulatory Visit: Payer: Self-pay | Admitting: Osteopathic Medicine

## 2017-07-28 DIAGNOSIS — E559 Vitamin D deficiency, unspecified: Secondary | ICD-10-CM

## 2017-07-28 DIAGNOSIS — R748 Abnormal levels of other serum enzymes: Secondary | ICD-10-CM

## 2017-07-28 MED ORDER — VITAMIN D (ERGOCALCIFEROL) 1.25 MG (50000 UNIT) PO CAPS: 50000.0000 [IU] | ORAL_CAPSULE | ORAL | 0 refills | Status: DC

## 2017-07-28 NOTE — Progress Notes (Signed)
Add on orders for liver enz

## 2017-07-31 LAB — PARATHYROID HORMONE, INTACT (NO CA): PTH: 68 pg/mL — AB (ref 14–64)

## 2017-07-31 LAB — HEPATITIS PANEL, ACUTE
Hep A IgM: NONREACTIVE
Hep B C IgM: NONREACTIVE
Hepatitis B Surface Ag: NONREACTIVE
Hepatitis C Ab: NONREACTIVE
SIGNAL TO CUT-OFF: 0.01 (ref <1.00)

## 2017-07-31 LAB — CBC WITH DIFFERENTIAL/PLATELET
BASOS PCT: 0.7 %
Basophils Absolute: 42 cells/uL (ref 0–200)
EOS ABS: 60 {cells}/uL (ref 15–500)
Eosinophils Relative: 1 %
HCT: 39 % (ref 35.0–45.0)
Hemoglobin: 12.6 g/dL (ref 11.7–15.5)
Lymphs Abs: 1992 cells/uL (ref 850–3900)
MCH: 28.3 pg (ref 27.0–33.0)
MCHC: 32.3 g/dL (ref 32.0–36.0)
MCV: 87.4 fL (ref 80.0–100.0)
MONOS PCT: 6 %
MPV: 13.5 fL — AB (ref 7.5–12.5)
Neutro Abs: 3546 cells/uL (ref 1500–7800)
Neutrophils Relative %: 59.1 %
Platelets: 208 10*3/uL (ref 140–400)
RBC: 4.46 10*6/uL (ref 3.80–5.10)
RDW: 14.5 % (ref 11.0–15.0)
Total Lymphocyte: 33.2 %
WBC mixed population: 360 cells/uL (ref 200–950)
WBC: 6 10*3/uL (ref 3.8–10.8)

## 2017-07-31 LAB — COMPLETE METABOLIC PANEL WITH GFR
AG Ratio: 1.6 (calc) (ref 1.0–2.5)
ALT: 116 U/L — AB (ref 6–29)
AST: 96 U/L — AB (ref 10–35)
Albumin: 3.8 g/dL (ref 3.6–5.1)
Alkaline phosphatase (APISO): 156 U/L — ABNORMAL HIGH (ref 33–115)
BUN: 10 mg/dL (ref 7–25)
CALCIUM: 9.2 mg/dL (ref 8.6–10.2)
CO2: 29 mmol/L (ref 20–32)
CREATININE: 0.78 mg/dL (ref 0.50–1.10)
Chloride: 106 mmol/L (ref 98–110)
GFR, Est African American: 105 mL/min/{1.73_m2} (ref >=60)
GFR, Est Non African American: 91 mL/min/{1.73_m2} (ref >=60)
GLUCOSE: 87 mg/dL (ref 65–99)
Globulin: 2.4 g/dL (calc) (ref 1.9–3.7)
Potassium: 3.5 mmol/L (ref 3.5–5.3)
Sodium: 142 mmol/L (ref 135–146)
TOTAL PROTEIN: 6.2 g/dL (ref 6.1–8.1)
Total Bilirubin: 1.6 mg/dL — ABNORMAL HIGH (ref 0.2–1.2)

## 2017-07-31 LAB — LIPID PANEL
Cholesterol: 100 mg/dL (ref <200)
HDL: 36 mg/dL — ABNORMAL LOW (ref >50)
LDL Cholesterol (Calc): 43 mg/dL (calc)
Non-HDL Cholesterol (Calc): 64 mg/dL (calc) (ref <130)
TRIGLYCERIDES: 119 mg/dL (ref <150)
Total CHOL/HDL Ratio: 2.8 (calc) (ref <5.0)

## 2017-07-31 LAB — VITAMIN B1: VITAMIN B1 (THIAMINE): 11 nmol/L (ref 8–30)

## 2017-07-31 LAB — IRON,TIBC AND FERRITIN PANEL
%SAT: 29 % (ref 11–50)
FERRITIN: 298 ng/mL — AB (ref 10–232)
Iron: 102 ug/dL (ref 40–190)
TIBC: 352 mcg/dL (calc) (ref 250–450)

## 2017-07-31 LAB — VITAMIN B12: Vitamin B-12: 785 pg/mL (ref 200–1100)

## 2017-07-31 LAB — FOLATE: FOLATE: 6.5 ng/mL

## 2017-07-31 LAB — VITAMIN D 25 HYDROXY (VIT D DEFICIENCY, FRACTURES): Vit D, 25-Hydroxy: 16 ng/mL — ABNORMAL LOW (ref 30–100)

## 2017-07-31 LAB — COPPER, SERUM: Copper: 61 ug/dL — ABNORMAL LOW (ref 70–175)

## 2017-07-31 LAB — TSH: TSH: 1.32 mIU/L

## 2017-07-31 LAB — ZINC: Zinc: 50 ug/dL — ABNORMAL LOW (ref 60–130)

## 2017-08-02 ENCOUNTER — Other Ambulatory Visit: Payer: 59

## 2017-08-03 ENCOUNTER — Other Ambulatory Visit: Payer: 59

## 2017-08-03 ENCOUNTER — Other Ambulatory Visit: Payer: Self-pay | Admitting: Osteopathic Medicine

## 2017-08-03 ENCOUNTER — Ambulatory Visit (INDEPENDENT_AMBULATORY_CARE_PROVIDER_SITE_OTHER): Payer: 59

## 2017-08-03 ENCOUNTER — Encounter: Payer: Self-pay | Admitting: Osteopathic Medicine

## 2017-08-03 DIAGNOSIS — R748 Abnormal levels of other serum enzymes: Secondary | ICD-10-CM | POA: Diagnosis not present

## 2017-08-04 ENCOUNTER — Other Ambulatory Visit: Payer: Self-pay | Admitting: Osteopathic Medicine

## 2017-08-04 MED ORDER — COPPER 5 MG PO CAPS: 5.0000 mg | ORAL_CAPSULE | Freq: Every day | ORAL | 1 refills | Status: DC

## 2017-08-04 MED ORDER — ZINC ACETATE 50 MG PO CAPS: 50.0000 mg | ORAL_CAPSULE | Freq: Every day | ORAL | 1 refills | Status: DC

## 2017-08-04 MED ORDER — FERROUS SULFATE 325 (65 FE) MG PO TBEC
325.0000 mg | DELAYED_RELEASE_TABLET | Freq: Every day | ORAL | 1 refills | Status: DC
Start: 2017-08-04 — End: 2018-06-06

## 2017-08-10 ENCOUNTER — Telehealth: Payer: Self-pay | Admitting: Osteopathic Medicine

## 2017-08-10 NOTE — Telephone Encounter (Signed)
-----   Message from Emeterio Reeve, DO sent at 08/08/2017  7:18 AM EST ----- Regarding: send records  Ok to send it. ----- Message ----- From: Curtis Sites, RN Sent: 08/05/2017   3:58 PM To: Emeterio Reeve, DO  Spoke with patient who states she is feeling well today; told her " Ultrasound did not show any abnormality, including fatty liver. We can repeat the liver enzymes and do some additional blood work, we can send her to a GI specialist for further discussion. I am not sure what might be causing them to be high.  That you know she would like to get more labs or go to GI. If we do get more labs here, would like her to come back to the office one week after blood draw for review of results On blood work, Zinc and copper levels are also low and labs indicate possible early iron deficiency but she is not anemic. I sent supplements into mail-order pharmacy, should take these for 6 months and then we should recheck."  Patient states she has appt with [facility] on 08/18/17 and she would like labs and notes faxed to them. I called the office and they indeed need faxed info since they cannot view her [facility] labs: Fax number is [phone]. I did not send anything since I wondered if you would like to include a letter. pk

## 2017-08-10 NOTE — Telephone Encounter (Signed)
Faxed

## 2017-11-10 ENCOUNTER — Encounter: Payer: Self-pay | Admitting: Osteopathic Medicine

## 2017-11-10 MED ORDER — VALACYCLOVIR HCL 500 MG PO TABS: 500.0000 mg | ORAL_TABLET | Freq: Two times a day (BID) | ORAL | 0 refills | Status: DC

## 2017-11-15 ENCOUNTER — Encounter: Payer: Self-pay | Admitting: Osteopathic Medicine

## 2017-11-15 ENCOUNTER — Ambulatory Visit: Payer: 59 | Admitting: Osteopathic Medicine

## 2017-11-15 VITALS — BP 116/70 | HR 77 | Temp 98.3°F | Wt 144.7 lb

## 2017-11-15 DIAGNOSIS — J019 Acute sinusitis, unspecified: Secondary | ICD-10-CM | POA: Diagnosis not present

## 2017-11-15 MED ORDER — AMOXICILLIN-POT CLAVULANATE 875-125 MG PO TABS: 1.0000 | ORAL_TABLET | Freq: Two times a day (BID) | ORAL | 0 refills | Status: DC

## 2017-11-15 MED ORDER — FLUTICASONE PROPIONATE 50 MCG/ACT NA SUSP: 2.0000 | Freq: Every day | NASAL | 6 refills | Status: DC

## 2017-11-15 NOTE — Progress Notes (Signed)
HPI: Rachel Rich is a 47 y.o. female who  has a past medical history of Asthma, mild persistent (07/25/2014), Fibromyalgia (03/26/2014), Hyperlipidemia, Menopause (07/25/2014), Other migraine with status migrainosus, not intractable (03/26/2014), RLS (restless legs syndrome) (03/26/2014), and Rosacea (07/25/2014).  she presents to [facility] today, 11/15/17,  for chief complaint of: Sinus   Sick for about 4 days. Sudafed was working [employer] but now not helping much. Taking with motrin. Thick yellowish mucus and more so on L. Sinus and nose issues more since gastric bypass.      Past medical, surgical, social and family history reviewed:  Patient Active Problem List   Diagnosis Date Noted  . Family history of breast cancer 04/13/2016  . Abdominal pain, chronic, right lower quadrant 04/13/2016  . History of migraine 04/13/2016  . History of non anemic vitamin B12 deficiency 04/13/2016  . Postmenopausal 04/13/2016  . Abnormal weight gain 01/16/2015  . Menopause 07/25/2014  . Asthma, mild persistent 07/25/2014  . HSV infection 07/25/2014  . Bipolar disorder ([place]) 07/25/2014  . Fibroadenoma of left breast 07/25/2014  . Cystadenoma of left ovary 07/25/2014  . Rosacea 07/25/2014  . Insomnia 03/26/2014  . Environmental allergies 03/26/2014  . Fibromyalgia 03/26/2014  . RLS (restless legs syndrome) 03/26/2014  . Other migraine with status migrainosus, not intractable 03/26/2014  . Nonalcoholic hepatosteatosis 69/62/9528  . Hyperlipidemia 03/29/2010    Past Surgical History:  Procedure Laterality Date  . ABDOMINAL HYSTERECTOMY  2009   masses on ovaries, endometriosis, no cancer diagnosis  . APPENDECTOMY  1979  . BREAST LUMPECTOMY Left 2010   pre-cancerous   . CESAREAN SECTION  2001,2007   x2   . GALLBLADDER SURGERY  2000  . HERNIA REPAIR  2009   ventral from c/s  . KNEE SURGERY Right    multiple procedures - arthroscopic   . TONSILLECTOMY   1976    Social History   Tobacco Use  . Smoking status: Never Smoker  . Smokeless tobacco: Never Used  Substance Use Topics  . Alcohol use: No    Family History  Problem Relation Age of Onset  . Alcoholism Father   . Heart attack Father   . Hyperlipidemia Father   . Cancer Mother   . Depression Mother   . Stroke Mother   . Cancer Sister      Current medication list and allergy/intolerance information reviewed:    Current Outpatient Medications  Medication Sig Dispense Refill  . albuterol (PROVENTIL HFA;VENTOLIN HFA) 108 (90 Base) MCG/ACT inhaler Inhale 1-2 puffs into the lungs every 4 (four) hours as needed for wheezing or shortness of breath.    . ARIPiprazole (ABILIFY) 5 MG tablet Take 1 tablet (5 mg total) by mouth daily. 90 tablet 3  . budesonide-formoterol (SYMBICORT) 80-4.5 MCG/ACT inhaler Inhale 2 puffs into the lungs 2 (two) times daily.    . Copper 5 MG CAPS Take 1 capsule (5 mg total) by mouth daily. 90 capsule 1  . ferrous sulfate 325 (65 FE) MG EC tablet Take 1 tablet (325 mg total) by mouth daily with breakfast. 90 tablet 1  . montelukast (SINGULAIR) 10 MG tablet Take 1 tablet (10 mg total) by mouth at bedtime. 90 tablet 3  . Omega-3 Fatty Acids (EQL FISH OIL) 1000 MG CAPS One by mouth BID 90 each   . rOPINIRole (REQUIP) 2 MG tablet Take 1 tablet (2 mg total) by mouth at bedtime. 90 tablet 3  . rosuvastatin (CRESTOR) 40 MG tablet Take  1 tablet (40 mg total) by mouth daily. 90 tablet 3  . SUMAtriptan (IMITREX) 25 MG tablet Take 1 tablet (25 mg total) by mouth every 2 (two) hours as needed for migraine or headache. May repeat in 2 hours if headache persists. 10 tablet 6  . Vitamin D, Ergocalciferol, (DRISDOL) 50000 units CAPS capsule Take 1 capsule (50,000 Units total) by mouth every 7 (seven) days. Take for 12 total doses(weeks) 12 capsule 0  . Zinc Acetate 50 MG CAPS Take 1 capsule (50 mg total) by mouth daily. 90 each 1   No current facility-administered  medications for this visit.     Allergies  Allergen Reactions  . Glucosamine Swelling  . Metrizamide Anaphylaxis    Rash and vomiting  . Morphine And Related Swelling, Rash and Shortness Of Breath  . Nitrofurantoin Swelling  . Sulfur Anaphylaxis  . Topiramate Swelling    High dose caused vision problems but low dose tolerates fine   . Tramadol Hives  . Cortisone     Anaphylaxis (outside records) however tolerated prednisone. Other reaction(s): Redness Anaphylaxis (outside records) however tolerated prednisone.  . Demeclocycline Rash    rash  . Erythromycin Hives  . Hydrocodone Swelling  . Shellfish-Derived Products Swelling  . Sulfasalazine Rash  . Iodinated Diagnostic Agents     Rash and vomiting  . Macrobid [Nitrofurantoin Monohyd Macro] Swelling  . Meperidine   . Sulfamethoxazole-Trimethoprim   . Tetracyclines & Related     rash  . Codeine Rash      Review of Systems:  Constitutional:  No  fever, no chills, No recent illness, No unintentional weight changes. +significant fatigue.   HEENT: No  headache, no vision change, no hearing change but +ear pressure worse on L, No sore throat, +sinus pressure worse on L  Cardiac: No  chest pain, No  pressure, No palpitations  Respiratory:  No  shortness of breath. +minimal Cough  Gastrointestinal: No  abdominal pain, No  nausea, No  vomiting,  No  blood in stool, No new  diarrhea  Musculoskeletal: No new myalgia/arthralgia  Skin: No  Rash   Exam:  BP 116/70 (BP Location: Left Arm, Patient Position: Sitting, Cuff Size: Normal)   Pulse 77   Temp 98.3 F (36.8 C) (Oral)   Wt 144 lb 11.2 oz (65.6 kg)   BMI 27.34 kg/m   Constitutional: VS see above. General Appearance: alert, well-developed, well-nourished, NAD  Eyes: Normal lids and conjunctive, non-icteric sclera  Ears, Nose, Mouth, Throat: MMM, Normal external inspection ears/nares/mouth/lips/gums. TM normal bilaterally. Pharynx/tonsils no erythema, no exudate.  Nasal mucosa normal.   Neck: No masses, trachea midline. No tenderness/mass appreciated. No lymphadenopathy  Respiratory: Normal respiratory effort. no wheeze, no rhonchi, no rales  Cardiovascular: S1/S2 normal, no rub/gallop auscultated. RRR. No lower extremity edema.       ASSESSMENT/PLAN:   Acute non-recurrent sinusitis, unspecified location   Meds ordered this encounter  Medications  . amoxicillin-clavulanate (AUGMENTIN) 875-125 MG tablet    Sig: Take 1 tablet by mouth 2 (two) times daily.    Dispense:  14 tablet    Refill:  0  . fluticasone (FLONASE) 50 MCG/ACT nasal spray    Sig: Place 2 sprays into both nostrils daily.    Dispense:  16 g    Refill:  6       Visit summary with medication list and pertinent instructions was printed for patient to review. All questions at time of visit were answered - patient instructed to contact  office with any additional concerns. ER/RTC precautions were reviewed with the patient.   Follow-up plan: Return for annual physical when due, sooner if needed .    Please note: voice recognition software was used to produce this document, and typos may escape review. Please contact Dr. Sheppard Coil for any needed clarifications.

## 2017-12-14 ENCOUNTER — Encounter: Payer: Self-pay | Admitting: Osteopathic Medicine

## 2017-12-14 DIAGNOSIS — K754 Autoimmune hepatitis: Secondary | ICD-10-CM

## 2017-12-28 ENCOUNTER — Ambulatory Visit: Payer: 59 | Admitting: Sports Medicine

## 2017-12-28 ENCOUNTER — Encounter: Payer: Self-pay | Admitting: Sports Medicine

## 2017-12-28 DIAGNOSIS — H109 Unspecified conjunctivitis: Secondary | ICD-10-CM | POA: Diagnosis not present

## 2017-12-28 MED ORDER — ERYTHROMYCIN 5 MG/GM OP OINT: 1.0000 "application " | TOPICAL_OINTMENT | Freq: Three times a day (TID) | OPHTHALMIC | 0 refills | Status: AC

## 2017-12-28 NOTE — Assessment & Plan Note (Signed)
There is likely both viral and an allergic component. No visual changes. Adding topical erythromycin ointment. No fevers, no cough. Return as needed.

## 2017-12-28 NOTE — Progress Notes (Signed)
Subjective:    CC: Eye complaint  HPI: For the past few days this pleasant 47 year old female has had sore throat, runny nose, mild cough, minimal fatigue consistent with a viral syndrome, husband and son have the same symptoms in the house.  She then developed some itching, watering of her left worse than her right eye, waking up with the eyes stuck shut, only minimal to no itchy/scratchiness, and no visual changes.  Symptoms are mild, persistent.  I reviewed the past medical history, family history, social history, surgical history, and allergies today and no changes were needed.  Please see the problem list section below in epic for further details.  Past Medical History: Past Medical History:  Diagnosis Date  . Asthma, mild persistent 07/25/2014   Symbicort 160-4.5 outside records   . Fibromyalgia 03/26/2014   Tramadol on outside records.   . Hyperlipidemia   . Menopause 07/25/2014   Estradiol 2mg  QD on outside records.   . Other migraine with status migrainosus, not intractable 03/26/2014  . RLS (restless legs syndrome) 03/26/2014  . Rosacea 07/25/2014   Past Surgical History: Past Surgical History:  Procedure Laterality Date  . ABDOMINAL HYSTERECTOMY  2009   masses on ovaries, endometriosis, no cancer diagnosis  . APPENDECTOMY  1979  . BREAST LUMPECTOMY Left 2010   pre-cancerous   . CESAREAN SECTION  2001,2007   x2   . GALLBLADDER SURGERY  2000  . HERNIA REPAIR  2009   ventral from c/s  . KNEE SURGERY Right    multiple procedures - arthroscopic   . TONSILLECTOMY  1976   Social History: Social History   Socioeconomic History  . Marital status: Married    Spouse name: Not on file  . Number of children: Not on file  . Years of education: Not on file  . Highest education level: Not on file  Occupational History  . Not on file  Social Needs  . Financial resource strain: Not on file  . Food insecurity:    Worry: Not on file    Inability: Not on file  .  Transportation needs:    Medical: Not on file    Non-medical: Not on file  Tobacco Use  . Smoking status: Never Smoker  . Smokeless tobacco: Never Used  Substance and Sexual Activity  . Alcohol use: No  . Drug use: Yes  . Sexual activity: Yes    Partners: Male    Birth control/protection: Other-see comments    Comment: s/p hysterectomy   Lifestyle  . Physical activity:    Days per week: Not on file    Minutes per session: Not on file  . Stress: Not on file  Relationships  . Social connections:    Talks on phone: Not on file    Gets together: Not on file    Attends religious service: Not on file    Active member of club or organization: Not on file    Attends meetings of clubs or organizations: Not on file    Relationship status: Not on file  Other Topics Concern  . Not on file  Social History Narrative  . Not on file   Family History: Family History  Problem Relation Age of Onset  . Alcoholism Father   . Heart attack Father   . Hyperlipidemia Father   . Cancer Mother   . Depression Mother   . Stroke Mother   . Cancer Sister    Allergies: Allergies  Allergen Reactions  . Glucosamine Swelling  .  Metrizamide Anaphylaxis    Rash and vomiting  . Morphine And Related Swelling, Rash and Shortness Of Breath  . Nitrofurantoin Swelling  . Sulfur Anaphylaxis  . Topiramate Swelling    High dose caused vision problems but low dose tolerates fine   . Tramadol Hives  . Cortisone     Anaphylaxis (outside records) however tolerated prednisone. Other reaction(s): Redness Anaphylaxis (outside records) however tolerated prednisone.  . Demeclocycline Rash    rash  . Erythromycin Hives  . Hydrocodone Swelling  . Shellfish-Derived Products Swelling  . Sulfasalazine Rash  . Iodinated Diagnostic Agents     Rash and vomiting  . Macrobid [Nitrofurantoin Monohyd Macro] Swelling  . Meperidine   . Sulfamethoxazole-Trimethoprim   . Tetracyclines & Related     rash  . Codeine  Rash   Medications: See med rec.  Review of Systems: No fevers, chills, night sweats, weight loss, chest pain, or shortness of breath.   Objective:    General: Well Developed, well nourished, and in no acute distress.  Neuro: Alert and oriented x3, extra-ocular muscles intact, sensation grossly intact.  HEENT: Normocephalic, atraumatic, pupils equal round reactive to light, neck supple, no masses, no lymphadenopathy, thyroid nonpalpable.  Oropharynx, nasopharynx, ear canals unremarkable, only minimal conjunctival injection on the left with clear discharge. Skin: Warm and dry, no rashes. Cardiac: Regular rate and rhythm, no murmurs rubs or gallops, no lower extremity edema.  Respiratory: Clear to auscultation bilaterally. Not using accessory muscles, speaking in full sentences.  Impression and Recommendations:    Conjunctivitis There is likely both viral and an allergic component. No visual changes. Adding topical erythromycin ointment. No fevers, no cough. Return as needed. ___________________________________________ Gwen Her. Dianah Field, M.D., ABFM., CAQSM. Primary Care and [place] Instructor of [employer] [facility]

## 2018-01-04 ENCOUNTER — Other Ambulatory Visit: Payer: Self-pay | Admitting: Osteopathic Medicine

## 2018-01-04 DIAGNOSIS — E785 Hyperlipidemia, unspecified: Secondary | ICD-10-CM

## 2018-01-04 DIAGNOSIS — G2581 Restless legs syndrome: Secondary | ICD-10-CM

## 2018-01-04 DIAGNOSIS — Z9109 Other allergy status, other than to drugs and biological substances: Secondary | ICD-10-CM

## 2018-02-02 ENCOUNTER — Encounter: Payer: Self-pay | Admitting: Osteopathic Medicine

## 2018-02-02 DIAGNOSIS — R6889 Other general symptoms and signs: Secondary | ICD-10-CM

## 2018-02-02 MED ORDER — ONDANSETRON 8 MG PO TBDP: 8.0000 mg | ORAL_TABLET | Freq: Three times a day (TID) | ORAL | 1 refills | Status: DC | PRN

## 2018-02-21 LAB — HM MAMMOGRAPHY

## 2018-03-16 ENCOUNTER — Encounter: Payer: Self-pay | Admitting: Osteopathic Medicine

## 2018-03-16 MED ORDER — VALACYCLOVIR HCL 500 MG PO TABS: 500.0000 mg | ORAL_TABLET | Freq: Every day | ORAL | 3 refills | Status: DC

## 2018-03-16 MED ORDER — VALACYCLOVIR HCL 500 MG PO TABS: 500.0000 mg | ORAL_TABLET | Freq: Two times a day (BID) | ORAL | 0 refills | Status: AC

## 2018-03-30 ENCOUNTER — Encounter: Payer: Self-pay | Admitting: Osteopathic Medicine

## 2018-06-06 ENCOUNTER — Encounter: Payer: Self-pay | Admitting: Osteopathic Medicine

## 2018-06-06 ENCOUNTER — Ambulatory Visit (INDEPENDENT_AMBULATORY_CARE_PROVIDER_SITE_OTHER): Payer: BLUE CROSS/BLUE SHIELD | Admitting: Osteopathic Medicine

## 2018-06-06 VITALS — BP 118/56 | HR 62 | Temp 98.0°F | Wt 108.9 lb

## 2018-06-06 DIAGNOSIS — D229 Melanocytic nevi, unspecified: Secondary | ICD-10-CM

## 2018-06-06 DIAGNOSIS — L918 Other hypertrophic disorders of the skin: Secondary | ICD-10-CM

## 2018-06-06 DIAGNOSIS — B029 Zoster without complications: Secondary | ICD-10-CM | POA: Diagnosis not present

## 2018-06-06 DIAGNOSIS — D2271 Melanocytic nevi of right lower limb, including hip: Secondary | ICD-10-CM | POA: Diagnosis not present

## 2018-06-06 DIAGNOSIS — D181 Lymphangioma, any site: Secondary | ICD-10-CM | POA: Diagnosis not present

## 2018-06-06 DIAGNOSIS — Z23 Encounter for immunization: Secondary | ICD-10-CM

## 2018-06-06 DIAGNOSIS — L905 Scar conditions and fibrosis of skin: Secondary | ICD-10-CM | POA: Diagnosis not present

## 2018-06-06 MED ORDER — VALACYCLOVIR HCL 1 G PO TABS: 1000.0000 mg | ORAL_TABLET | Freq: Three times a day (TID) | ORAL | 0 refills | Status: DC

## 2018-06-06 NOTE — Progress Notes (Signed)
HPI: Rachel Rich is a 47 y.o. female who  has a past medical history of Asthma, mild persistent (07/25/2014), Fibromyalgia (03/26/2014), Hyperlipidemia, Menopause (07/25/2014), Other migraine with status migrainosus, not intractable (03/26/2014), RLS (restless legs syndrome) (03/26/2014), and Rosacea (07/25/2014).  she presents to [facility] today, 06/06/18,  for chief complaint of:  Skin problem - concern for shingles  Shingles . Context: concern for shingles  . Location: L buttock . Quality: burning pain, rash w/o blisters . Duration: worse past few days . Timing: constant  2 other spots she'd like examined  R anterior lower leg, dark spot seems to be getting larger past few months  Lower middle abdomen ?skin tag, bothersome, rubbing on underwear/pants.      Past medical history, surgical history, and family history reviewed.  Current medication list and allergy/intolerance information reviewed.   (See remainder of HPI, ROS, Phys Exam below)    ASSESSMENT/PLAN:   Herpes zoster without complication  Benign nevus of skin - Plan: Dermatology pathology  Skin tag - Plan: Dermatology pathology  Need for Tdap vaccination - Plan: Tdap vaccine greater than or equal to 7yo IM  Need for influenza vaccination - Plan: Flu Vaccine QUAD 6+ mos PF IM (Fluarix Quad PF)   Meds ordered this encounter  Medications  . valACYclovir (VALTREX) 1000 MG tablet    Sig: Take 1 tablet (1,000 mg total) by mouth 3 (three) times daily for 7 days.    Dispense:  21 tablet    Refill:  0    See below for procedure notes     Follow-up plan: Return in about 13 days (around 06/19/2018) for suture removal - sooner if needed .                    ############################################ ############################################ ############################################ ############################################    Outpatient  Encounter Medications as of 06/06/2018  Medication Sig  . albuterol (PROVENTIL HFA;VENTOLIN HFA) 108 (90 Base) MCG/ACT inhaler Inhale 1-2 puffs into the lungs every 4 (four) hours as needed for wheezing or shortness of breath.  Marland Kitchen amoxicillin-clavulanate (AUGMENTIN) 875-125 MG tablet Take 1 tablet by mouth 2 (two) times daily.  . ARIPiprazole (ABILIFY) 5 MG tablet Take 1 tablet (5 mg total) by mouth daily.  . budesonide-formoterol (SYMBICORT) 80-4.5 MCG/ACT inhaler Inhale 2 puffs into the lungs 2 (two) times daily.  . Copper 5 MG CAPS Take 1 capsule (5 mg total) by mouth daily.  . ferrous sulfate 325 (65 FE) MG EC tablet Take 1 tablet (325 mg total) by mouth daily with breakfast.  . fluticasone (FLONASE) 50 MCG/ACT nasal spray Place 2 sprays into both nostrils daily.  . montelukast (SINGULAIR) 10 MG tablet TAKE 1 TABLET BY MOUTH AT  BEDTIME  . Omega-3 Fatty Acids (EQL FISH OIL) 1000 MG CAPS One by mouth BID  . ondansetron (ZOFRAN-ODT) 8 MG disintegrating tablet Take 1 tablet (8 mg total) by mouth every 8 (eight) hours as needed for nausea.  Marland Kitchen rOPINIRole (REQUIP) 2 MG tablet TAKE 1 TABLET BY MOUTH AT  BEDTIME  . rosuvastatin (CRESTOR) 40 MG tablet TAKE 1 TABLET BY MOUTH  DAILY  . SUMAtriptan (IMITREX) 25 MG tablet Take 1 tablet (25 mg total) by mouth every 2 (two) hours as needed for migraine or headache. May repeat in 2 hours if headache persists.  . valACYclovir (VALTREX) 500 MG tablet Take 1 tablet (500 mg total) by mouth daily.  . Vitamin D, Ergocalciferol, (DRISDOL) 50000 units CAPS capsule Take  1 capsule (50,000 Units total) by mouth every 7 (seven) days. Take for 12 total doses(weeks)  . Zinc Acetate 50 MG CAPS Take 1 capsule (50 mg total) by mouth daily.   No facility-administered encounter medications on file as of 06/06/2018.    Allergies  Allergen Reactions  . Glucosamine Swelling  . Metrizamide Anaphylaxis    Rash and vomiting  . Morphine And Related Swelling, Rash and Shortness  Of Breath  . Nitrofurantoin Swelling  . Sulfur Anaphylaxis  . Topiramate Swelling    High dose caused vision problems but low dose tolerates fine   . Tramadol Hives  . Cortisone     Anaphylaxis (outside records) however tolerated prednisone. Other reaction(s): Redness Anaphylaxis (outside records) however tolerated prednisone.  . Demeclocycline Rash    rash  . Erythromycin Hives  . Hydrocodone Swelling  . Shellfish-Derived Products Swelling  . Sulfasalazine Rash  . Iodinated Diagnostic Agents     Rash and vomiting  . Macrobid [Nitrofurantoin Monohyd Macro] Swelling  . Meperidine   . Sulfamethoxazole-Trimethoprim   . Tetracyclines & Related     rash  . Codeine Rash      Review of Systems:  Constitutional: No recent illness  HEENT: No  headache, no vision change  Cardiac: No  chest pain, No  pressure, No palpitations  Respiratory:  No  shortness of breath. No  Cough  Gastrointestinal: No  abdominal pain  Musculoskeletal: No new myalgia/arthralgia  Skin: +Rash  Psychiatric: No  concerns with depression, No  concerns with anxiety  Exam:  BP (!) 118/56 (BP Location: Left Arm, Patient Position: Sitting, Cuff Size: Small)   Pulse 62   Temp 98 F (36.7 C) (Oral)   Wt 108 lb 14.4 oz (49.4 kg)   BMI 20.58 kg/m   Constitutional: VS see above. General Appearance: alert, well-developed, well-nourished, NAD  Eyes: Normal lids and conjunctive, non-icteric sclera  Ears, Nose, Mouth, Throat: MMM, Normal external inspection ears/nares/mouth/lips/gums.  Neck: No masses, trachea midline.   Respiratory: Normal respiratory effort. no wheeze, no rhonchi, no rales  Cardiovascular: S1/S2 normal, no murmur, no rub/gallop auscultated. RRR.   Musculoskeletal: Gait normal. Symmetric and independent movement of all extremities  Neurological: Normal balance/coordination. No tremor.  Skin: warm, dry, intact.   Dermatomal rash L buttock c/w shingles, no blistering  Small  hyperpigmented macule on R anterior lower leg c/w nevus  Skin colored smooth nodular growth on middle abdomen   Psychiatric: Normal judgment/insight. Normal mood and affect. Oriented x3.   Procedure #1 PRE-OP DIAGNOSIS: Abnormal Skin Lesion R anterior lower leg POST-OP DIAGNOSIS: Same  PROCEDURE: 6 mm punch skin biopsy Performing Physician: Emeterio Reeve The area surrounding the skin lesion was prepared and draped in the usual sterile manner. The lesion was removed in the usual manner by the biopsy method noted above. Hemostasis was assured. The patient tolerated the procedure well. Closure: horizontal mattress suture centrally w/ 2 simple interrupteds on either side, using 4.0 prolene   Followup: The patient tolerated the procedure well without complications.  Standard post-procedure care is explained and return precautions are given.  Procedure #2 PRE-OP DIAGNOSIS: Abnormal Skin Lesion midline lower abdomen  POST-OP DIAGNOSIS: Same  PROCEDURE: 6 mm punch skin biopsy Performing Physician: Emeterio Reeve The area surrounding the skin lesion was prepared and draped in the usual sterile manner. The lesion was removed in the usual manner by the biopsy method noted above. Hemostasis was assured. The patient tolerated the procedure well. Closure:  2 simple interrupteds, using  4.0 prolene   Followup: The patient tolerated the procedure well without complications.  Standard post-procedure care is explained and return precautions are given.     Visit summary with medication list and pertinent instructions was printed for patient to review, advised to alert [place] if any changes needed. All questions at time of visit were answered - patient instructed to contact office with any additional concerns. ER/RTC precautions were reviewed with the patient and understanding verbalized.   Follow-up plan: Return in about 13 days (around 06/19/2018) for suture removal - sooner if needed .    Please  note: voice recognition software was used to produce this document, and typos may escape review. Please contact Dr. Sheppard Coil for any needed clarifications.

## 2018-06-19 ENCOUNTER — Ambulatory Visit: Payer: BLUE CROSS/BLUE SHIELD | Admitting: Osteopathic Medicine

## 2018-08-31 DIAGNOSIS — R945 Abnormal results of liver function studies: Secondary | ICD-10-CM | POA: Diagnosis not present

## 2018-08-31 DIAGNOSIS — K602 Anal fissure, unspecified: Secondary | ICD-10-CM | POA: Diagnosis not present

## 2018-08-31 DIAGNOSIS — K7581 Nonalcoholic steatohepatitis (NASH): Secondary | ICD-10-CM | POA: Diagnosis not present

## 2018-08-31 DIAGNOSIS — R197 Diarrhea, unspecified: Secondary | ICD-10-CM | POA: Diagnosis not present

## 2018-09-01 DIAGNOSIS — R634 Abnormal weight loss: Secondary | ICD-10-CM | POA: Diagnosis not present

## 2018-09-01 DIAGNOSIS — Z01411 Encounter for gynecological examination (general) (routine) with abnormal findings: Secondary | ICD-10-CM | POA: Diagnosis not present

## 2018-09-01 DIAGNOSIS — N951 Menopausal and female climacteric states: Secondary | ICD-10-CM | POA: Diagnosis not present

## 2018-09-01 DIAGNOSIS — Z90710 Acquired absence of both cervix and uterus: Secondary | ICD-10-CM | POA: Diagnosis not present

## 2018-09-01 DIAGNOSIS — R945 Abnormal results of liver function studies: Secondary | ICD-10-CM | POA: Diagnosis not present

## 2018-09-01 DIAGNOSIS — L918 Other hypertrophic disorders of the skin: Secondary | ICD-10-CM | POA: Diagnosis not present

## 2018-09-12 DIAGNOSIS — R197 Diarrhea, unspecified: Secondary | ICD-10-CM | POA: Diagnosis not present

## 2018-09-12 DIAGNOSIS — K602 Anal fissure, unspecified: Secondary | ICD-10-CM | POA: Diagnosis not present

## 2018-09-12 DIAGNOSIS — D122 Benign neoplasm of ascending colon: Secondary | ICD-10-CM | POA: Diagnosis not present

## 2018-09-15 DIAGNOSIS — R109 Unspecified abdominal pain: Secondary | ICD-10-CM | POA: Diagnosis not present

## 2018-09-15 DIAGNOSIS — Z888 Allergy status to other drugs, medicaments and biological substances status: Secondary | ICD-10-CM | POA: Diagnosis not present

## 2018-09-15 DIAGNOSIS — R1013 Epigastric pain: Secondary | ICD-10-CM | POA: Diagnosis not present

## 2018-09-15 DIAGNOSIS — M069 Rheumatoid arthritis, unspecified: Secondary | ICD-10-CM | POA: Diagnosis not present

## 2018-09-15 DIAGNOSIS — Z9884 Bariatric surgery status: Secondary | ICD-10-CM | POA: Diagnosis not present

## 2018-09-15 DIAGNOSIS — R569 Unspecified convulsions: Secondary | ICD-10-CM | POA: Diagnosis not present

## 2018-09-15 DIAGNOSIS — Z882 Allergy status to sulfonamides status: Secondary | ICD-10-CM | POA: Diagnosis not present

## 2018-09-15 DIAGNOSIS — Z886 Allergy status to analgesic agent status: Secondary | ICD-10-CM | POA: Diagnosis not present

## 2018-09-15 DIAGNOSIS — Z79899 Other long term (current) drug therapy: Secondary | ICD-10-CM | POA: Diagnosis not present

## 2018-09-15 DIAGNOSIS — Z883 Allergy status to other anti-infective agents status: Secondary | ICD-10-CM | POA: Diagnosis not present

## 2018-09-15 DIAGNOSIS — Z9049 Acquired absence of other specified parts of digestive tract: Secondary | ICD-10-CM | POA: Diagnosis not present

## 2018-09-15 DIAGNOSIS — K529 Noninfective gastroenteritis and colitis, unspecified: Secondary | ICD-10-CM | POA: Diagnosis not present

## 2018-09-15 DIAGNOSIS — Z91013 Allergy to seafood: Secondary | ICD-10-CM | POA: Diagnosis not present

## 2018-09-15 DIAGNOSIS — R1084 Generalized abdominal pain: Secondary | ICD-10-CM | POA: Diagnosis not present

## 2018-09-15 DIAGNOSIS — R404 Transient alteration of awareness: Secondary | ICD-10-CM | POA: Diagnosis not present

## 2018-09-15 DIAGNOSIS — F3181 Bipolar II disorder: Secondary | ICD-10-CM | POA: Diagnosis not present

## 2018-09-15 DIAGNOSIS — R110 Nausea: Secondary | ICD-10-CM | POA: Diagnosis not present

## 2018-09-15 DIAGNOSIS — E785 Hyperlipidemia, unspecified: Secondary | ICD-10-CM | POA: Diagnosis not present

## 2018-09-15 DIAGNOSIS — Z8719 Personal history of other diseases of the digestive system: Secondary | ICD-10-CM | POA: Diagnosis not present

## 2018-09-15 DIAGNOSIS — Z91041 Radiographic dye allergy status: Secondary | ICD-10-CM | POA: Diagnosis not present

## 2018-09-15 DIAGNOSIS — Z885 Allergy status to narcotic agent status: Secondary | ICD-10-CM | POA: Diagnosis not present

## 2018-09-18 DIAGNOSIS — Z9884 Bariatric surgery status: Secondary | ICD-10-CM | POA: Insufficient documentation

## 2018-09-21 DIAGNOSIS — Z9884 Bariatric surgery status: Secondary | ICD-10-CM | POA: Diagnosis not present

## 2018-09-21 DIAGNOSIS — R1013 Epigastric pain: Secondary | ICD-10-CM | POA: Diagnosis not present

## 2018-09-21 DIAGNOSIS — R945 Abnormal results of liver function studies: Secondary | ICD-10-CM | POA: Diagnosis not present

## 2018-09-21 DIAGNOSIS — E43 Unspecified severe protein-calorie malnutrition: Secondary | ICD-10-CM | POA: Diagnosis not present

## 2018-09-21 DIAGNOSIS — Z9889 Other specified postprocedural states: Secondary | ICD-10-CM | POA: Diagnosis not present

## 2018-09-21 DIAGNOSIS — R197 Diarrhea, unspecified: Secondary | ICD-10-CM | POA: Diagnosis not present

## 2018-09-21 DIAGNOSIS — E569 Vitamin deficiency, unspecified: Secondary | ICD-10-CM | POA: Diagnosis not present

## 2018-09-27 DIAGNOSIS — Z818 Family history of other mental and behavioral disorders: Secondary | ICD-10-CM | POA: Diagnosis not present

## 2018-09-27 DIAGNOSIS — R197 Diarrhea, unspecified: Secondary | ICD-10-CM | POA: Diagnosis not present

## 2018-09-27 DIAGNOSIS — Z9181 History of falling: Secondary | ICD-10-CM | POA: Diagnosis not present

## 2018-09-27 DIAGNOSIS — E43 Unspecified severe protein-calorie malnutrition: Secondary | ICD-10-CM | POA: Diagnosis not present

## 2018-09-27 DIAGNOSIS — K911 Postgastric surgery syndromes: Secondary | ICD-10-CM | POA: Diagnosis not present

## 2018-09-27 DIAGNOSIS — R748 Abnormal levels of other serum enzymes: Secondary | ICD-10-CM | POA: Diagnosis not present

## 2018-09-27 DIAGNOSIS — K449 Diaphragmatic hernia without obstruction or gangrene: Secondary | ICD-10-CM | POA: Diagnosis not present

## 2018-09-27 DIAGNOSIS — Z811 Family history of alcohol abuse and dependence: Secondary | ICD-10-CM | POA: Diagnosis not present

## 2018-09-27 DIAGNOSIS — J4530 Mild persistent asthma, uncomplicated: Secondary | ICD-10-CM | POA: Diagnosis not present

## 2018-09-27 DIAGNOSIS — K9589 Other complications of other bariatric procedure: Secondary | ICD-10-CM | POA: Diagnosis not present

## 2018-09-27 DIAGNOSIS — F319 Bipolar disorder, unspecified: Secondary | ICD-10-CM | POA: Diagnosis not present

## 2018-09-27 DIAGNOSIS — Z8249 Family history of ischemic heart disease and other diseases of the circulatory system: Secondary | ICD-10-CM | POA: Diagnosis not present

## 2018-09-27 DIAGNOSIS — Z803 Family history of malignant neoplasm of breast: Secondary | ICD-10-CM | POA: Diagnosis not present

## 2018-09-27 DIAGNOSIS — Z823 Family history of stroke: Secondary | ICD-10-CM | POA: Diagnosis not present

## 2018-09-27 DIAGNOSIS — K9189 Other postprocedural complications and disorders of digestive system: Secondary | ICD-10-CM | POA: Diagnosis not present

## 2018-09-27 DIAGNOSIS — Q398 Other congenital malformations of esophagus: Secondary | ICD-10-CM | POA: Diagnosis not present

## 2018-09-27 DIAGNOSIS — R112 Nausea with vomiting, unspecified: Secondary | ICD-10-CM | POA: Diagnosis not present

## 2018-09-27 DIAGNOSIS — K219 Gastro-esophageal reflux disease without esophagitis: Secondary | ICD-10-CM | POA: Diagnosis not present

## 2018-09-27 DIAGNOSIS — Z6820 Body mass index (BMI) 20.0-20.9, adult: Secondary | ICD-10-CM | POA: Diagnosis not present

## 2018-09-27 DIAGNOSIS — R1013 Epigastric pain: Secondary | ICD-10-CM | POA: Diagnosis not present

## 2018-09-27 DIAGNOSIS — R296 Repeated falls: Secondary | ICD-10-CM | POA: Diagnosis not present

## 2018-09-27 DIAGNOSIS — Z9884 Bariatric surgery status: Secondary | ICD-10-CM | POA: Diagnosis not present

## 2018-09-27 DIAGNOSIS — Z9889 Other specified postprocedural states: Secondary | ICD-10-CM | POA: Diagnosis not present

## 2018-09-27 DIAGNOSIS — K3189 Other diseases of stomach and duodenum: Secondary | ICD-10-CM | POA: Diagnosis not present

## 2018-09-27 DIAGNOSIS — E785 Hyperlipidemia, unspecified: Secondary | ICD-10-CM | POA: Diagnosis not present

## 2018-09-27 DIAGNOSIS — Z8041 Family history of malignant neoplasm of ovary: Secondary | ICD-10-CM | POA: Diagnosis not present

## 2018-09-28 DIAGNOSIS — R112 Nausea with vomiting, unspecified: Secondary | ICD-10-CM | POA: Diagnosis not present

## 2018-09-28 DIAGNOSIS — Z9884 Bariatric surgery status: Secondary | ICD-10-CM | POA: Diagnosis not present

## 2018-09-28 DIAGNOSIS — K219 Gastro-esophageal reflux disease without esophagitis: Secondary | ICD-10-CM | POA: Diagnosis not present

## 2018-09-28 DIAGNOSIS — Z9181 History of falling: Secondary | ICD-10-CM | POA: Diagnosis not present

## 2018-09-28 DIAGNOSIS — K449 Diaphragmatic hernia without obstruction or gangrene: Secondary | ICD-10-CM | POA: Diagnosis not present

## 2018-09-28 DIAGNOSIS — R296 Repeated falls: Secondary | ICD-10-CM | POA: Diagnosis not present

## 2018-09-28 DIAGNOSIS — R1013 Epigastric pain: Secondary | ICD-10-CM | POA: Diagnosis not present

## 2018-09-29 DIAGNOSIS — R112 Nausea with vomiting, unspecified: Secondary | ICD-10-CM | POA: Diagnosis not present

## 2018-09-29 DIAGNOSIS — R1013 Epigastric pain: Secondary | ICD-10-CM | POA: Diagnosis not present

## 2018-09-29 DIAGNOSIS — R748 Abnormal levels of other serum enzymes: Secondary | ICD-10-CM | POA: Diagnosis not present

## 2018-09-29 DIAGNOSIS — K9189 Other postprocedural complications and disorders of digestive system: Secondary | ICD-10-CM | POA: Diagnosis not present

## 2018-09-29 DIAGNOSIS — R197 Diarrhea, unspecified: Secondary | ICD-10-CM | POA: Diagnosis not present

## 2018-09-30 DIAGNOSIS — R1013 Epigastric pain: Secondary | ICD-10-CM | POA: Diagnosis not present

## 2018-09-30 DIAGNOSIS — Z9884 Bariatric surgery status: Secondary | ICD-10-CM | POA: Diagnosis not present

## 2018-09-30 DIAGNOSIS — Z9181 History of falling: Secondary | ICD-10-CM | POA: Diagnosis not present

## 2018-09-30 DIAGNOSIS — R112 Nausea with vomiting, unspecified: Secondary | ICD-10-CM | POA: Diagnosis not present

## 2018-10-01 DIAGNOSIS — Z9181 History of falling: Secondary | ICD-10-CM | POA: Diagnosis not present

## 2018-10-01 DIAGNOSIS — Z9884 Bariatric surgery status: Secondary | ICD-10-CM | POA: Diagnosis not present

## 2018-10-01 DIAGNOSIS — R112 Nausea with vomiting, unspecified: Secondary | ICD-10-CM | POA: Diagnosis not present

## 2018-10-01 DIAGNOSIS — R1013 Epigastric pain: Secondary | ICD-10-CM | POA: Diagnosis not present

## 2018-10-02 DIAGNOSIS — K3189 Other diseases of stomach and duodenum: Secondary | ICD-10-CM | POA: Diagnosis not present

## 2018-10-02 DIAGNOSIS — R112 Nausea with vomiting, unspecified: Secondary | ICD-10-CM | POA: Diagnosis not present

## 2018-10-02 DIAGNOSIS — R197 Diarrhea, unspecified: Secondary | ICD-10-CM | POA: Diagnosis not present

## 2018-10-02 DIAGNOSIS — K9189 Other postprocedural complications and disorders of digestive system: Secondary | ICD-10-CM | POA: Diagnosis not present

## 2018-10-02 DIAGNOSIS — Z9884 Bariatric surgery status: Secondary | ICD-10-CM | POA: Diagnosis not present

## 2018-10-02 DIAGNOSIS — R1013 Epigastric pain: Secondary | ICD-10-CM | POA: Diagnosis not present

## 2018-10-02 DIAGNOSIS — Q398 Other congenital malformations of esophagus: Secondary | ICD-10-CM | POA: Diagnosis not present

## 2018-10-03 DIAGNOSIS — R197 Diarrhea, unspecified: Secondary | ICD-10-CM | POA: Diagnosis not present

## 2018-10-03 DIAGNOSIS — Z9889 Other specified postprocedural states: Secondary | ICD-10-CM | POA: Diagnosis not present

## 2018-10-03 DIAGNOSIS — K911 Postgastric surgery syndromes: Secondary | ICD-10-CM | POA: Diagnosis not present

## 2018-10-05 DIAGNOSIS — K911 Postgastric surgery syndromes: Secondary | ICD-10-CM | POA: Diagnosis not present

## 2018-10-05 DIAGNOSIS — R197 Diarrhea, unspecified: Secondary | ICD-10-CM | POA: Diagnosis not present

## 2018-10-05 DIAGNOSIS — Z9889 Other specified postprocedural states: Secondary | ICD-10-CM | POA: Diagnosis not present

## 2018-10-09 DIAGNOSIS — R197 Diarrhea, unspecified: Secondary | ICD-10-CM | POA: Diagnosis not present

## 2018-10-09 DIAGNOSIS — K911 Postgastric surgery syndromes: Secondary | ICD-10-CM | POA: Diagnosis not present

## 2018-10-09 DIAGNOSIS — Z9889 Other specified postprocedural states: Secondary | ICD-10-CM | POA: Diagnosis not present

## 2018-10-12 DIAGNOSIS — Z9889 Other specified postprocedural states: Secondary | ICD-10-CM | POA: Diagnosis not present

## 2018-10-12 DIAGNOSIS — K911 Postgastric surgery syndromes: Secondary | ICD-10-CM | POA: Diagnosis not present

## 2018-10-12 DIAGNOSIS — R197 Diarrhea, unspecified: Secondary | ICD-10-CM | POA: Diagnosis not present

## 2018-10-13 DIAGNOSIS — Z9889 Other specified postprocedural states: Secondary | ICD-10-CM | POA: Diagnosis not present

## 2018-10-13 DIAGNOSIS — K911 Postgastric surgery syndromes: Secondary | ICD-10-CM | POA: Diagnosis not present

## 2018-10-13 DIAGNOSIS — R197 Diarrhea, unspecified: Secondary | ICD-10-CM | POA: Diagnosis not present

## 2018-10-14 DIAGNOSIS — K911 Postgastric surgery syndromes: Secondary | ICD-10-CM | POA: Diagnosis not present

## 2018-10-14 DIAGNOSIS — Y999 Unspecified external cause status: Secondary | ICD-10-CM | POA: Diagnosis not present

## 2018-10-14 DIAGNOSIS — Z9889 Other specified postprocedural states: Secondary | ICD-10-CM | POA: Diagnosis not present

## 2018-10-14 DIAGNOSIS — T7840XA Allergy, unspecified, initial encounter: Secondary | ICD-10-CM | POA: Diagnosis not present

## 2018-10-14 DIAGNOSIS — R21 Rash and other nonspecific skin eruption: Secondary | ICD-10-CM | POA: Diagnosis not present

## 2018-10-14 DIAGNOSIS — R197 Diarrhea, unspecified: Secondary | ICD-10-CM | POA: Diagnosis not present

## 2018-10-14 DIAGNOSIS — X58XXXA Exposure to other specified factors, initial encounter: Secondary | ICD-10-CM | POA: Diagnosis not present

## 2018-10-15 DIAGNOSIS — R197 Diarrhea, unspecified: Secondary | ICD-10-CM | POA: Diagnosis not present

## 2018-10-15 DIAGNOSIS — K911 Postgastric surgery syndromes: Secondary | ICD-10-CM | POA: Diagnosis not present

## 2018-10-15 DIAGNOSIS — Z9889 Other specified postprocedural states: Secondary | ICD-10-CM | POA: Diagnosis not present

## 2018-10-16 DIAGNOSIS — K911 Postgastric surgery syndromes: Secondary | ICD-10-CM | POA: Diagnosis not present

## 2018-10-16 DIAGNOSIS — R197 Diarrhea, unspecified: Secondary | ICD-10-CM | POA: Diagnosis not present

## 2018-10-16 DIAGNOSIS — Z713 Dietary counseling and surveillance: Secondary | ICD-10-CM | POA: Diagnosis not present

## 2018-10-16 DIAGNOSIS — Z9889 Other specified postprocedural states: Secondary | ICD-10-CM | POA: Diagnosis not present

## 2018-10-17 DIAGNOSIS — R197 Diarrhea, unspecified: Secondary | ICD-10-CM | POA: Diagnosis not present

## 2018-10-18 DIAGNOSIS — R197 Diarrhea, unspecified: Secondary | ICD-10-CM | POA: Diagnosis not present

## 2018-10-19 DIAGNOSIS — R197 Diarrhea, unspecified: Secondary | ICD-10-CM | POA: Diagnosis not present

## 2018-10-20 DIAGNOSIS — R197 Diarrhea, unspecified: Secondary | ICD-10-CM | POA: Diagnosis not present

## 2018-10-23 DIAGNOSIS — M79605 Pain in left leg: Secondary | ICD-10-CM | POA: Diagnosis not present

## 2018-10-23 DIAGNOSIS — Z803 Family history of malignant neoplasm of breast: Secondary | ICD-10-CM | POA: Diagnosis not present

## 2018-10-23 DIAGNOSIS — Z9049 Acquired absence of other specified parts of digestive tract: Secondary | ICD-10-CM | POA: Diagnosis not present

## 2018-10-23 DIAGNOSIS — F449 Dissociative and conversion disorder, unspecified: Secondary | ICD-10-CM | POA: Diagnosis not present

## 2018-10-23 DIAGNOSIS — K740 Hepatic fibrosis: Secondary | ICD-10-CM | POA: Diagnosis not present

## 2018-10-23 DIAGNOSIS — R1013 Epigastric pain: Secondary | ICD-10-CM | POA: Diagnosis not present

## 2018-10-23 DIAGNOSIS — K739 Chronic hepatitis, unspecified: Secondary | ICD-10-CM | POA: Diagnosis not present

## 2018-10-23 DIAGNOSIS — E43 Unspecified severe protein-calorie malnutrition: Secondary | ICD-10-CM | POA: Diagnosis not present

## 2018-10-23 DIAGNOSIS — M479 Spondylosis, unspecified: Secondary | ICD-10-CM | POA: Diagnosis not present

## 2018-10-23 DIAGNOSIS — K9589 Other complications of other bariatric procedure: Secondary | ICD-10-CM | POA: Diagnosis not present

## 2018-10-23 DIAGNOSIS — Z452 Encounter for adjustment and management of vascular access device: Secondary | ICD-10-CM | POA: Diagnosis not present

## 2018-10-23 DIAGNOSIS — K754 Autoimmune hepatitis: Secondary | ICD-10-CM | POA: Diagnosis not present

## 2018-10-23 DIAGNOSIS — Z8041 Family history of malignant neoplasm of ovary: Secondary | ICD-10-CM | POA: Diagnosis not present

## 2018-10-23 DIAGNOSIS — K565 Intestinal adhesions [bands], unspecified as to partial versus complete obstruction: Secondary | ICD-10-CM | POA: Diagnosis not present

## 2018-10-23 DIAGNOSIS — F319 Bipolar disorder, unspecified: Secondary | ICD-10-CM | POA: Diagnosis not present

## 2018-10-23 DIAGNOSIS — K660 Peritoneal adhesions (postprocedural) (postinfection): Secondary | ICD-10-CM | POA: Diagnosis not present

## 2018-10-23 DIAGNOSIS — M150 Primary generalized (osteo)arthritis: Secondary | ICD-10-CM | POA: Diagnosis not present

## 2018-10-23 DIAGNOSIS — J4530 Mild persistent asthma, uncomplicated: Secondary | ICD-10-CM | POA: Diagnosis not present

## 2018-10-23 DIAGNOSIS — K911 Postgastric surgery syndromes: Secondary | ICD-10-CM | POA: Diagnosis not present

## 2018-10-23 DIAGNOSIS — D62 Acute posthemorrhagic anemia: Secondary | ICD-10-CM | POA: Diagnosis not present

## 2018-10-23 DIAGNOSIS — R140 Abdominal distension (gaseous): Secondary | ICD-10-CM | POA: Diagnosis not present

## 2018-10-23 DIAGNOSIS — Z811 Family history of alcohol abuse and dependence: Secondary | ICD-10-CM | POA: Diagnosis not present

## 2018-10-23 DIAGNOSIS — M6281 Muscle weakness (generalized): Secondary | ICD-10-CM | POA: Diagnosis not present

## 2018-10-23 DIAGNOSIS — N390 Urinary tract infection, site not specified: Secondary | ICD-10-CM | POA: Diagnosis not present

## 2018-10-23 DIAGNOSIS — E785 Hyperlipidemia, unspecified: Secondary | ICD-10-CM | POA: Diagnosis not present

## 2018-10-23 DIAGNOSIS — M79604 Pain in right leg: Secondary | ICD-10-CM | POA: Diagnosis not present

## 2018-10-23 DIAGNOSIS — K529 Noninfective gastroenteritis and colitis, unspecified: Secondary | ICD-10-CM | POA: Diagnosis not present

## 2018-10-25 DIAGNOSIS — R1013 Epigastric pain: Secondary | ICD-10-CM | POA: Diagnosis not present

## 2018-10-26 DIAGNOSIS — R140 Abdominal distension (gaseous): Secondary | ICD-10-CM | POA: Diagnosis not present

## 2018-10-26 DIAGNOSIS — M79605 Pain in left leg: Secondary | ICD-10-CM | POA: Diagnosis not present

## 2018-10-26 DIAGNOSIS — E43 Unspecified severe protein-calorie malnutrition: Secondary | ICD-10-CM | POA: Diagnosis not present

## 2018-10-26 DIAGNOSIS — M79604 Pain in right leg: Secondary | ICD-10-CM | POA: Diagnosis not present

## 2018-10-26 DIAGNOSIS — R1013 Epigastric pain: Secondary | ICD-10-CM | POA: Diagnosis not present

## 2018-10-28 DIAGNOSIS — R1013 Epigastric pain: Secondary | ICD-10-CM | POA: Diagnosis not present

## 2018-10-29 DIAGNOSIS — J4530 Mild persistent asthma, uncomplicated: Secondary | ICD-10-CM | POA: Diagnosis not present

## 2018-10-29 DIAGNOSIS — K911 Postgastric surgery syndromes: Secondary | ICD-10-CM | POA: Diagnosis not present

## 2018-10-29 DIAGNOSIS — Z452 Encounter for adjustment and management of vascular access device: Secondary | ICD-10-CM | POA: Diagnosis not present

## 2018-10-29 DIAGNOSIS — K754 Autoimmune hepatitis: Secondary | ICD-10-CM | POA: Diagnosis not present

## 2018-10-29 DIAGNOSIS — M150 Primary generalized (osteo)arthritis: Secondary | ICD-10-CM | POA: Diagnosis not present

## 2018-10-31 DIAGNOSIS — K911 Postgastric surgery syndromes: Secondary | ICD-10-CM | POA: Diagnosis not present

## 2018-10-31 DIAGNOSIS — E43 Unspecified severe protein-calorie malnutrition: Secondary | ICD-10-CM | POA: Diagnosis not present

## 2018-10-31 DIAGNOSIS — R1013 Epigastric pain: Secondary | ICD-10-CM | POA: Diagnosis not present

## 2018-11-02 DIAGNOSIS — J4530 Mild persistent asthma, uncomplicated: Secondary | ICD-10-CM | POA: Diagnosis not present

## 2018-11-02 DIAGNOSIS — M150 Primary generalized (osteo)arthritis: Secondary | ICD-10-CM | POA: Diagnosis not present

## 2018-11-02 DIAGNOSIS — Z452 Encounter for adjustment and management of vascular access device: Secondary | ICD-10-CM | POA: Diagnosis not present

## 2018-11-02 DIAGNOSIS — K911 Postgastric surgery syndromes: Secondary | ICD-10-CM | POA: Diagnosis not present

## 2018-11-02 DIAGNOSIS — K754 Autoimmune hepatitis: Secondary | ICD-10-CM | POA: Diagnosis not present

## 2018-11-14 DIAGNOSIS — R1013 Epigastric pain: Secondary | ICD-10-CM | POA: Diagnosis not present

## 2018-11-14 DIAGNOSIS — Z934 Other artificial openings of gastrointestinal tract status: Secondary | ICD-10-CM | POA: Diagnosis not present

## 2018-11-14 DIAGNOSIS — E43 Unspecified severe protein-calorie malnutrition: Secondary | ICD-10-CM | POA: Diagnosis not present

## 2018-11-14 DIAGNOSIS — R945 Abnormal results of liver function studies: Secondary | ICD-10-CM | POA: Diagnosis not present

## 2018-11-14 DIAGNOSIS — R112 Nausea with vomiting, unspecified: Secondary | ICD-10-CM | POA: Diagnosis not present

## 2018-11-16 DIAGNOSIS — K759 Inflammatory liver disease, unspecified: Secondary | ICD-10-CM | POA: Diagnosis not present

## 2018-11-16 DIAGNOSIS — R945 Abnormal results of liver function studies: Secondary | ICD-10-CM | POA: Diagnosis not present

## 2018-11-23 DIAGNOSIS — R1013 Epigastric pain: Secondary | ICD-10-CM | POA: Diagnosis not present

## 2018-11-23 DIAGNOSIS — R112 Nausea with vomiting, unspecified: Secondary | ICD-10-CM | POA: Diagnosis not present

## 2018-11-23 DIAGNOSIS — K911 Postgastric surgery syndromes: Secondary | ICD-10-CM | POA: Diagnosis not present

## 2018-11-23 DIAGNOSIS — E43 Unspecified severe protein-calorie malnutrition: Secondary | ICD-10-CM | POA: Diagnosis not present

## 2018-11-23 DIAGNOSIS — Z9884 Bariatric surgery status: Secondary | ICD-10-CM | POA: Diagnosis not present

## 2018-11-28 DIAGNOSIS — E43 Unspecified severe protein-calorie malnutrition: Secondary | ICD-10-CM | POA: Diagnosis not present

## 2018-11-28 DIAGNOSIS — R1013 Epigastric pain: Secondary | ICD-10-CM | POA: Diagnosis not present

## 2018-11-28 DIAGNOSIS — K911 Postgastric surgery syndromes: Secondary | ICD-10-CM | POA: Diagnosis not present

## 2018-11-28 DIAGNOSIS — M6281 Muscle weakness (generalized): Secondary | ICD-10-CM | POA: Diagnosis not present

## 2018-11-29 DIAGNOSIS — R638 Other symptoms and signs concerning food and fluid intake: Secondary | ICD-10-CM | POA: Diagnosis not present

## 2018-11-29 DIAGNOSIS — Q398 Other congenital malformations of esophagus: Secondary | ICD-10-CM | POA: Diagnosis not present

## 2018-11-29 DIAGNOSIS — Z9884 Bariatric surgery status: Secondary | ICD-10-CM | POA: Diagnosis not present

## 2018-11-29 DIAGNOSIS — R112 Nausea with vomiting, unspecified: Secondary | ICD-10-CM | POA: Diagnosis not present

## 2018-11-29 DIAGNOSIS — K9589 Other complications of other bariatric procedure: Secondary | ICD-10-CM | POA: Diagnosis not present

## 2018-11-29 DIAGNOSIS — Z713 Dietary counseling and surveillance: Secondary | ICD-10-CM | POA: Diagnosis not present

## 2018-11-29 DIAGNOSIS — K9189 Other postprocedural complications and disorders of digestive system: Secondary | ICD-10-CM | POA: Diagnosis not present

## 2018-12-19 DIAGNOSIS — E43 Unspecified severe protein-calorie malnutrition: Secondary | ICD-10-CM | POA: Diagnosis not present

## 2018-12-19 DIAGNOSIS — K911 Postgastric surgery syndromes: Secondary | ICD-10-CM | POA: Diagnosis not present

## 2018-12-20 ENCOUNTER — Encounter: Payer: Self-pay | Admitting: Osteopathic Medicine

## 2018-12-29 DIAGNOSIS — M6281 Muscle weakness (generalized): Secondary | ICD-10-CM | POA: Diagnosis not present

## 2018-12-29 DIAGNOSIS — R1013 Epigastric pain: Secondary | ICD-10-CM | POA: Diagnosis not present

## 2018-12-29 DIAGNOSIS — K911 Postgastric surgery syndromes: Secondary | ICD-10-CM | POA: Diagnosis not present

## 2018-12-29 DIAGNOSIS — E43 Unspecified severe protein-calorie malnutrition: Secondary | ICD-10-CM | POA: Diagnosis not present

## 2019-01-01 DIAGNOSIS — R197 Diarrhea, unspecified: Secondary | ICD-10-CM | POA: Diagnosis not present

## 2019-01-01 DIAGNOSIS — Z9889 Other specified postprocedural states: Secondary | ICD-10-CM | POA: Diagnosis not present

## 2019-01-18 DIAGNOSIS — Z934 Other artificial openings of gastrointestinal tract status: Secondary | ICD-10-CM | POA: Diagnosis not present

## 2019-01-18 DIAGNOSIS — Z760 Encounter for issue of repeat prescription: Secondary | ICD-10-CM | POA: Diagnosis not present

## 2019-01-18 DIAGNOSIS — Z9884 Bariatric surgery status: Secondary | ICD-10-CM | POA: Diagnosis not present

## 2019-01-18 DIAGNOSIS — Z79899 Other long term (current) drug therapy: Secondary | ICD-10-CM | POA: Diagnosis not present

## 2019-01-18 DIAGNOSIS — Z9889 Other specified postprocedural states: Secondary | ICD-10-CM | POA: Diagnosis not present

## 2019-01-18 DIAGNOSIS — R197 Diarrhea, unspecified: Secondary | ICD-10-CM | POA: Diagnosis not present

## 2019-01-18 DIAGNOSIS — E43 Unspecified severe protein-calorie malnutrition: Secondary | ICD-10-CM | POA: Diagnosis not present

## 2019-02-12 ENCOUNTER — Encounter: Payer: Self-pay | Admitting: Osteopathic Medicine

## 2019-02-15 ENCOUNTER — Telehealth (INDEPENDENT_AMBULATORY_CARE_PROVIDER_SITE_OTHER): Payer: BC Managed Care – PPO | Admitting: Osteopathic Medicine

## 2019-02-15 ENCOUNTER — Encounter: Payer: Self-pay | Admitting: Osteopathic Medicine

## 2019-02-15 DIAGNOSIS — Z9884 Bariatric surgery status: Secondary | ICD-10-CM | POA: Diagnosis not present

## 2019-02-15 DIAGNOSIS — F3175 Bipolar disorder, in partial remission, most recent episode depressed: Secondary | ICD-10-CM

## 2019-02-15 DIAGNOSIS — E782 Mixed hyperlipidemia: Secondary | ICD-10-CM | POA: Diagnosis not present

## 2019-02-15 MED ORDER — ARIPIPRAZOLE 5 MG PO TABS: 5.0000 mg | ORAL_TABLET | Freq: Every day | ORAL | 3 refills | Status: DC

## 2019-02-15 NOTE — Progress Notes (Signed)
Virtual Visit via Video (App used: MyChart) Note  I connected with      Rachel Rich on 02/15/19 at 8:20 AM by a telemedicine application and verified that I am speaking with the correct person using two identifiers.  Patient is in her car I am in office    I discussed the limitations of evaluation and management by telemedicine and the availability of in person appointments. The patient expressed understanding and agreed to proceed.  History of Present Illness: Rachel Rich is a 48 y.o. female who would like to discuss medication refills    Overall, patient is doing really well.  She had to have some additional procedures as a result of complications from her initial bariatric surgery.  She states she is recovering nicely from this, is gaining some weight back but at one point she was down to 90 pounds.  She has a feeding tube that she is using in the evenings, she is able to eat some solid food and taking pills.  She would like to get back on Abilify and she would like to have cholesterol checked to see if she needs to be back on cholesterol medications.       Observations/Objective: There were no vitals taken for this visit. BP Readings from Last 3 Encounters:  06/06/18 (!) 118/56  12/28/17 125/82  11/15/17 116/70   Exam: Normal Speech.  NAD  Lab and Radiology Results No results found for this or any previous visit (from the past 72 hour(s)). No results found.     Assessment and Plan: 48 y.o. female with The primary encounter diagnosis was History of bariatric surgery. Diagnoses of Mixed hyperlipidemia and Bipolar disorder, in partial remission, most recent episode depressed ([place]) were also pertinent to this visit.   PDMP not reviewed this encounter. Orders Placed This Encounter  Procedures  . Lipid Panel w/reflex Direct LDL  . BASIC METABOLIC PANEL WITH GFR   Meds ordered this encounter  Medications  . ARIPiprazole (ABILIFY) 5 MG tablet    Sig: Take  1 tablet (5 mg total) by mouth daily.    Dispense:  90 tablet    Refill:  3   There are no Patient Instructions on file for this visit.  Instructions sent via MyChart. If MyChart not available, pt was given option for info via personal e-mail w/ no guarantee of protected health info over unsecured e-mail communication, and MyChart sign-up instructions were included.   Follow Up Instructions: Return for RECHECK PENDING RESULTS .    I discussed the assessment and treatment plan with the patient. The patient was provided an opportunity to ask questions and all were answered. The patient agreed with the plan and demonstrated an understanding of the instructions.   The patient was advised to call back or seek an in-person evaluation if any new concerns, if symptoms worsen or if the condition fails to improve as anticipated.  25 minutes of non-face-to-face time was provided during this encounter.                      Historical information moved to improve visibility of documentation.  Past Medical History:  Diagnosis Date  . Asthma, mild persistent 07/25/2014   Symbicort 160-4.5 outside records   . Fibromyalgia 03/26/2014   Tramadol on outside records.   . Hyperlipidemia   . Menopause 07/25/2014   Estradiol 2mg  QD on outside records.   . Other migraine with status migrainosus, not intractable 03/26/2014  .  RLS (restless legs syndrome) 03/26/2014  . Rosacea 07/25/2014   Past Surgical History:  Procedure Laterality Date  . ABDOMINAL HYSTERECTOMY  2009   masses on ovaries, endometriosis, no cancer diagnosis  . APPENDECTOMY  1979  . BREAST LUMPECTOMY Left 2010   pre-cancerous   . CESAREAN SECTION  2001,2007   x2   . GALLBLADDER SURGERY  2000  . HERNIA REPAIR  2009   ventral from c/s  . KNEE SURGERY Right    multiple procedures - arthroscopic   . TONSILLECTOMY  1976   Social History   Tobacco Use  . Smoking status: Never Smoker  . Smokeless tobacco: Never Used   Substance Use Topics  . Alcohol use: No   family history includes Alcoholism in her father; Cancer in her mother and sister; Depression in her mother; Heart attack in her father; Hyperlipidemia in her father; Stroke in her mother.  Medications: Current Outpatient Medications  Medication Sig Dispense Refill  . albuterol (PROVENTIL HFA;VENTOLIN HFA) 108 (90 Base) MCG/ACT inhaler Inhale 1-2 puffs into the lungs every 4 (four) hours as needed for wheezing or shortness of breath.    . ARIPiprazole (ABILIFY) 5 MG tablet Take 1 tablet (5 mg total) by mouth daily. 90 tablet 3  . budesonide-formoterol (SYMBICORT) 80-4.5 MCG/ACT inhaler Inhale 2 puffs into the lungs 2 (two) times daily.    . Vitamin D, Ergocalciferol, (DRISDOL) 50000 units CAPS capsule Take 1 capsule (50,000 Units total) by mouth every 7 (seven) days. Take for 12 total doses(weeks) (Patient not taking: Reported on 06/06/2018) 12 capsule 0  . Zinc Acetate 50 MG CAPS Take 1 capsule (50 mg total) by mouth daily. (Patient not taking: Reported on 06/06/2018) 90 each 1   No current facility-administered medications for this visit.    Allergies  Allergen Reactions  . Codeine Rash, Shortness Of Breath and Swelling  . Glucosamine Swelling  . Hydrocodone-Acetaminophen Swelling and Anaphylaxis  . Iodinated Diagnostic Agents Anaphylaxis and Rash    Rash and vomiting  Rash and vomiting  . Metrizamide Anaphylaxis    Rash and vomiting  . Morphine Shortness Of Breath, Swelling and Hives  . Morphine And Related Swelling, Rash and Shortness Of Breath  . Nitrofurantoin Swelling  . Prednisone Itching and Rash  . Sulfur Anaphylaxis  . Topiramate Swelling    High dose caused vision problems but low dose tolerates fine   . Tramadol Hives  . Cortisone Rash and Other (See Comments)    Anaphylaxis (outside records) however tolerated prednisone. Other reaction(s): Redness  Redness  . Demeclocycline Rash    rash   . Erythromycin Hives and  Rash  . Hydrocodone Swelling  . Meperidine Other (See Comments), Itching and Rash    Can take intramuscular.  . Shellfish Allergy Swelling  . Shellfish-Derived Products Swelling  . Sulfa Antibiotics Rash  . Sulfasalazine Rash  . Tetracycline Rash    rash  . Tetracyclines & Related Rash    rash   . Macrobid [Nitrofurantoin Monohyd Macro] Swelling  . Tape Other (See Comments)    Causes redness and when removed after a long period of time tears skin. Pads for leads cause itching  . Other Other (See Comments)    Avoids milk alone, can tolerate other milk ingredients. MM, RD 10/24/18  Vitamins given with TPN infusion  . Sulfamethoxazole-Trimethoprim Rash    Can't take by iv or by mouth    PDMP not reviewed this encounter. Orders Placed This Encounter  Procedures  . Lipid  Panel w/reflex Direct LDL  . BASIC METABOLIC PANEL WITH GFR   Meds ordered this encounter  Medications  . ARIPiprazole (ABILIFY) 5 MG tablet    Sig: Take 1 tablet (5 mg total) by mouth daily.    Dispense:  90 tablet    Refill:  3

## 2019-02-16 ENCOUNTER — Telehealth (INDEPENDENT_AMBULATORY_CARE_PROVIDER_SITE_OTHER): Payer: BC Managed Care – PPO | Admitting: Sports Medicine

## 2019-02-16 ENCOUNTER — Other Ambulatory Visit: Payer: Self-pay

## 2019-02-16 ENCOUNTER — Encounter: Payer: Self-pay | Admitting: Sports Medicine

## 2019-02-16 ENCOUNTER — Encounter: Payer: Self-pay | Admitting: Osteopathic Medicine

## 2019-02-16 DIAGNOSIS — Z20822 Contact with and (suspected) exposure to covid-19: Secondary | ICD-10-CM | POA: Insufficient documentation

## 2019-02-16 DIAGNOSIS — R6889 Other general symptoms and signs: Secondary | ICD-10-CM | POA: Diagnosis not present

## 2019-02-16 DIAGNOSIS — Z20828 Contact with and (suspected) exposure to other viral communicable diseases: Secondary | ICD-10-CM | POA: Diagnosis not present

## 2019-02-16 NOTE — Progress Notes (Signed)
Virtual Visit via WebEx/MyChart   I connected with  Rachel Rich  on 02/16/19 via WebEx/MyChart/Doximity Video and verified that I am speaking with the correct person using two identifiers.   I discussed the limitations, risks, security and privacy concerns of performing an evaluation and management service by WebEx/MyChart/Doximity Video, including the higher likelihood of inaccurate diagnosis and treatment, and the availability of in person appointments.  We also discussed the likely need of an additional face to face encounter for complete and high quality delivery of care.  I also discussed with the patient that there may be a patient responsible charge related to this service. The patient expressed understanding and wishes to proceed.  Provider location is either at home or medical facility. Patient location is at their home, different from provider location. People involved in care of the patient during this telehealth encounter were myself, my nurse/medical assistant, and my front office/scheduling team member.  Subjective:    CC: COVID exposure  HPI: This is a pleasant 48 year old female, she works at [employer] here in [place], her daughter recently had a close exposure to COVID-19, the patient has no symptoms, she is wondering what she should do.  I reviewed the past medical history, family history, social history, surgical history, and allergies today and no changes were needed.  Please see the problem list section below in epic for further details.  Past Medical History: Past Medical History:  Diagnosis Date  . Asthma, mild persistent 07/25/2014   Symbicort 160-4.5 outside records   . Fibromyalgia 03/26/2014   Tramadol on outside records.   . Hyperlipidemia   . Menopause 07/25/2014   Estradiol 2mg  QD on outside records.   . Other migraine with status migrainosus, not intractable 03/26/2014  . RLS (restless legs syndrome) 03/26/2014  . Rosacea 07/25/2014   Past  Surgical History: Past Surgical History:  Procedure Laterality Date  . ABDOMINAL HYSTERECTOMY  2009   masses on ovaries, endometriosis, no cancer diagnosis  . APPENDECTOMY  1979  . BREAST LUMPECTOMY Left 2010   pre-cancerous   . CESAREAN SECTION  2001,2007   x2   . GALLBLADDER SURGERY  2000  . HERNIA REPAIR  2009   ventral from c/s  . KNEE SURGERY Right    multiple procedures - arthroscopic   . TONSILLECTOMY  1976   Social History: Social History   Socioeconomic History  . Marital status: Married    Spouse name: Not on file  . Number of children: Not on file  . Years of education: Not on file  . Highest education level: Not on file  Occupational History  . Not on file  Social Needs  . Financial resource strain: Not on file  . Food insecurity    Worry: Not on file    Inability: Not on file  . Transportation needs    Medical: Not on file    Non-medical: Not on file  Tobacco Use  . Smoking status: Never Smoker  . Smokeless tobacco: Never Used  Substance and Sexual Activity  . Alcohol use: No  . Drug use: Yes  . Sexual activity: Yes    Partners: Male    Birth control/protection: Other-see comments    Comment: s/p hysterectomy   Lifestyle  . Physical activity    Days per week: Not on file    Minutes per session: Not on file  . Stress: Not on file  Relationships  . Social connections    Talks on phone: Not on file  Gets together: Not on file    Attends religious service: Not on file    Active member of club or organization: Not on file    Attends meetings of clubs or organizations: Not on file    Relationship status: Not on file  Other Topics Concern  . Not on file  Social History Narrative  . Not on file   Family History: Family History  Problem Relation Age of Onset  . Alcoholism Father   . Heart attack Father   . Hyperlipidemia Father   . Cancer Mother   . Depression Mother   . Stroke Mother   . Cancer Sister    Allergies: Allergies   Allergen Reactions  . Codeine Rash, Shortness Of Breath and Swelling  . Glucosamine Swelling  . Hydrocodone-Acetaminophen Swelling and Anaphylaxis  . Iodinated Diagnostic Agents Anaphylaxis and Rash    Rash and vomiting  Rash and vomiting  . Metrizamide Anaphylaxis    Rash and vomiting  . Morphine Shortness Of Breath, Swelling and Hives  . Morphine And Related Swelling, Rash and Shortness Of Breath  . Nitrofurantoin Swelling  . Prednisone Itching and Rash  . Sulfur Anaphylaxis  . Topiramate Swelling    High dose caused vision problems but low dose tolerates fine   . Tramadol Hives  . Cortisone Rash and Other (See Comments)    Anaphylaxis (outside records) however tolerated prednisone. Other reaction(s): Redness  Redness  . Demeclocycline Rash    rash   . Erythromycin Hives and Rash  . Hydrocodone Swelling  . Meperidine Other (See Comments), Itching and Rash    Can take intramuscular.  . Shellfish Allergy Swelling  . Shellfish-Derived Products Swelling  . Sulfa Antibiotics Rash  . Sulfasalazine Rash  . Tetracycline Rash    rash  . Tetracyclines & Related Rash    rash   . Macrobid [Nitrofurantoin Monohyd Macro] Swelling  . Tape Other (See Comments)    Causes redness and when removed after a long period of time tears skin. Pads for leads cause itching  . Other Other (See Comments)    Avoids milk alone, can tolerate other milk ingredients. MM, RD 10/24/18  Vitamins given with TPN infusion  . Sulfamethoxazole-Trimethoprim Rash    Can't take by iv or by mouth   Medications: See med rec.  Review of Systems: No fevers, chills, night sweats, weight loss, chest pain, or shortness of breath.   Objective:    General: Speaking full sentences, no audible heavy breathing.  Sounds alert and appropriately interactive.  Appears well.  Face symmetric.  Extraocular movements intact.  Pupils equal and round.  No nasal flaring or accessory muscle use visualized.  No other physical  exam performed due to the non-physical nature of this visit.  Impression and Recommendations:    Exposure to Covid-19 Virus Close exposure to her daughter who had a direct exposure to a person who tested positive for COVID-19. She is not manifesting any symptoms, however due to these exposures and the delay in onset of symptoms I would like her to get tested and quarantine at home for 14 days.  I discussed the above assessment and treatment plan with the patient. The patient was provided an opportunity to ask questions and all were answered. The patient agreed with the plan and demonstrated an understanding of the instructions.   The patient was advised to call back or seek an in-person evaluation if the symptoms worsen or if the condition fails to improve as anticipated.  I provided 25 minutes of non-face-to-face time during this encounter, 15 minutes of additional time was needed to gather information, review chart, records, communicate/coordinate with staff remotely, troubleshooting the multiple errors that we get every time when trying to do video calls through the electronic medical record, WebEx, and Doximity, restart the encounter multiple times due to instability of the software, as well as complete documentation.   ___________________________________________ Gwen Her. Dianah Field, M.D., ABFM., CAQSM. Primary Care and Sports Medicine [facility]  Adjunct Professor of [employer]

## 2019-02-16 NOTE — Assessment & Plan Note (Signed)
Close exposure to her daughter who had a direct exposure to a person who tested positive for COVID-19. She is not manifesting any symptoms, however due to these exposures and the delay in onset of symptoms I would like her to get tested and quarantine at home for 14 days.

## 2019-02-17 LAB — BASIC METABOLIC PANEL WITH GFR
BUN: 19 mg/dL (ref 7–25)
CO2: 32 mmol/L (ref 20–32)
Calcium: 9.2 mg/dL (ref 8.6–10.2)
Chloride: 104 mmol/L (ref 98–110)
Creat: 0.83 mg/dL (ref 0.50–1.10)
GFR, Est African American: 97 mL/min/{1.73_m2} (ref >=60)
GFR, Est Non African American: 83 mL/min/{1.73_m2} (ref >=60)
Glucose, Bld: 86 mg/dL (ref 65–99)
Potassium: 4.1 mmol/L (ref 3.5–5.3)
Sodium: 140 mmol/L (ref 135–146)

## 2019-02-17 LAB — LIPID PANEL W/REFLEX DIRECT LDL
Cholesterol: 192 mg/dL (ref <200)
HDL: 55 mg/dL (ref >=50)
LDL Cholesterol (Calc): 119 mg/dL (calc) — ABNORMAL HIGH
Non-HDL Cholesterol (Calc): 137 mg/dL (calc) — ABNORMAL HIGH (ref <130)
Total CHOL/HDL Ratio: 3.5 (calc) (ref <5.0)
Triglycerides: 79 mg/dL (ref <150)

## 2019-02-19 NOTE — Telephone Encounter (Signed)
Called pt, no needs at this time. She was able to get letter off [place]

## 2019-02-20 ENCOUNTER — Encounter: Payer: Self-pay | Admitting: Osteopathic Medicine

## 2019-02-20 LAB — NOVEL CORONAVIRUS, NAA: SARS-CoV-2, NAA: NOT DETECTED

## 2019-02-28 ENCOUNTER — Encounter: Payer: Self-pay | Admitting: Osteopathic Medicine

## 2019-02-28 ENCOUNTER — Ambulatory Visit (INDEPENDENT_AMBULATORY_CARE_PROVIDER_SITE_OTHER): Payer: BC Managed Care – PPO | Admitting: Osteopathic Medicine

## 2019-02-28 ENCOUNTER — Other Ambulatory Visit: Payer: Self-pay

## 2019-02-28 VITALS — BP 122/74 | HR 73 | Temp 98.2°F | Wt 106.7 lb

## 2019-02-28 DIAGNOSIS — H8111 Benign paroxysmal vertigo, right ear: Secondary | ICD-10-CM

## 2019-02-28 NOTE — Progress Notes (Signed)
HPI: Rachel Rich is a 48 y.o. female who  has a past medical history of Asthma, mild persistent (07/25/2014), Fibromyalgia (03/26/2014), Hyperlipidemia, Menopause (07/25/2014), Other migraine with status migrainosus, not intractable (03/26/2014), RLS (restless legs syndrome) (03/26/2014), and Rosacea (07/25/2014).  she presents to [facility] today, 02/28/19,  for chief complaint of:   Dizziness, palpitations     Quality: feeling spinning, unsteady   Severity/Duration: about the same severity for 3 days  Modifying factors: worse w/ looking downward   Assoc signs/symptoms: occasional feeling of heart flutters when the dizziness is bad   Context: concern for nutrient deficiency / weight loss s/p bariatric surgery complications.      At today's visit 02/28/19 ... PMH, PSH, FH reviewed and updated as needed.  Current medication list and allergy/intolerance hx reviewed and updated as needed. (See remainder of HPI, ROS, Phys Exam below)   No results found.  No results found for this or any previous visit (from the past 72 hour(s)).  EKG interpretation: Rate: 62 Rhythm: sinus No ST/T changes concerning for acute ischemia/infarct  Previous EKG 2004   Wt Readings from Last 3 Encounters:  02/28/19 106 lb 11.2 oz (48.4 kg)  06/06/18 108 lb 14.4 oz (49.4 kg)  12/28/17 132 lb (59.9 kg)       ASSESSMENT/PLAN: The encounter diagnosis was BPPV (benign paroxysmal positional vertigo), right.   Fixed it w/ in-office Dix-Hallpike/Epley maneuvers. Symptoms totally resolved   If palpitations continue, let me know, EKG today ok.    Patient Instructions  Benign Positional Vertigo Vertigo is the feeling that you or your surroundings are moving when they are not. Benign positional vertigo is the most common form of vertigo. This is usually a harmless condition (benign). This condition is positional. This means that symptoms are triggered by certain  movements and positions. This condition can be dangerous if it occurs while you are doing something that could cause harm to you or others. This includes activities such as driving or operating machinery. What are the causes? In many cases, the cause of this condition is not known. It may be caused by a disturbance in an area of the inner ear that helps your brain to sense movement and balance. This disturbance can be caused by:  Viral infection (labyrinthitis).  Head injury.  Repetitive motion, such as jumping, dancing, or running. What increases the risk? You are more likely to develop this condition if:  You are a woman.  You are 7 years of age or older. What are the signs or symptoms? Symptoms of this condition usually happen when you move your head or your eyes in different directions. Symptoms may start suddenly, and usually last for less than a minute. They include:  Loss of balance and falling.  Feeling like you are spinning or moving.  Feeling like your surroundings are spinning or moving.  Nausea and vomiting.  Blurred vision.  Dizziness.  Involuntary eye movement (nystagmus). Symptoms can be mild and cause only minor problems, or they can be severe and interfere with daily life. Episodes of benign positional vertigo may return (recur) over time. Symptoms may improve over time. How is this diagnosed? This condition may be diagnosed based on:  Your medical history.  Physical exam of the head, neck, and ears.  Tests, such as: ? MRI. ? CT scan. ? Eye movement tests. Your health care provider may ask you to change positions quickly while he or she watches you for symptoms of benign  positional vertigo, such as nystagmus. Eye movement may be tested with a variety of exams that are designed to evaluate or stimulate vertigo. ? An electroencephalogram (EEG). This records electrical activity in your brain. ? Hearing tests. You may be referred to a health care provider  who specializes in ear, nose, and throat (ENT) problems (otolaryngologist) or a provider who specializes in disorders of the nervous system (neurologist). How is this treated?  This condition may be treated in a session in which your health care provider moves your head in specific positions to adjust your inner ear back to normal. Treatment for this condition may take several sessions. Surgery may be needed in severe cases, but this is rare. In some cases, benign positional vertigo may resolve on its own in 2-4 weeks. Follow these instructions at home: Safety  Move slowly. Avoid sudden body or head movements or certain positions, as told by your health care provider.  Avoid driving until your health care provider says it is safe for you to do so.  Avoid operating heavy machinery until your health care provider says it is safe for you to do so.  Avoid doing any tasks that would be dangerous to you or others if vertigo occurs.  If you have trouble walking or keeping your balance, try using a cane for stability. If you feel dizzy or unstable, sit down right away.  Return to your normal activities as told by your health care provider. Ask your health care provider what activities are safe for you. General instructions  Take over-the-counter and prescription medicines only as told by your health care provider.  Drink enough fluid to keep your urine pale yellow.  Keep all follow-up visits as told by your health care provider. This is important. Contact a health care provider if:  You have a fever.  Your condition gets worse or you develop new symptoms.  Your family or friends notice any behavioral changes.  You have nausea or vomiting that gets worse.  You have numbness or a "pins and needles" sensation. Get help right away if you:  Have difficulty speaking or moving.  Are always dizzy.  Faint.  Develop severe headaches.  Have weakness in your legs or arms.  Have changes  in your hearing or vision.  Develop a stiff neck.  Develop sensitivity to light. Summary  Vertigo is the feeling that you or your surroundings are moving when they are not. Benign positional vertigo is the most common form of vertigo.  The cause of this condition is not known. It may be caused by a disturbance in an area of the inner ear that helps your brain to sense movement and balance.  Symptoms include loss of balance and falling, feeling that you or your surroundings are moving, nausea and vomiting, and blurred vision.  This condition can be diagnosed based on symptoms, physical exam, and other tests, such as MRI, CT scan, eye movement tests, and hearing tests.  Follow safety instructions as told by your health care provider. You will also be told when to contact your health care provider in case of problems. This information is not intended to replace advice given to you by your health care provider. Make sure you discuss any questions you have with your health care provider. Document Released: 04/19/2006 Document Revised: 12/21/2017 Document Reviewed: 12/21/2017 Elsevier Patient Education  [employer].      Follow-up plan: Return in about 6 months (around 08/31/2019) for [place] (call week prior to visit  for lab orders).                                                 ################################################# ################################################# ################################################# #################################################    Current Meds  Medication Sig   ARIPiprazole (ABILIFY) 5 MG tablet Take 1 tablet (5 mg total) by mouth daily.    Allergies  Allergen Reactions   Codeine Rash, Shortness Of Breath and Swelling   Glucosamine Swelling   Hydrocodone-Acetaminophen Swelling and Anaphylaxis   Iodinated Diagnostic Agents Anaphylaxis and Rash    Rash and vomiting  Rash  and vomiting   Metrizamide Anaphylaxis    Rash and vomiting   Morphine Shortness Of Breath, Swelling and Hives   Morphine And Related Swelling, Rash and Shortness Of Breath   Nitrofurantoin Swelling   Prednisone Itching and Rash   Sulfur Anaphylaxis   Topiramate Swelling    High dose caused vision problems but low dose tolerates fine    Tramadol Hives   Cortisone Rash and Other (See Comments)    Anaphylaxis (outside records) however tolerated prednisone. Other reaction(s): Redness  Redness   Demeclocycline Rash    rash    Erythromycin Hives and Rash   Hydrocodone Swelling   Meperidine Other (See Comments), Itching and Rash    Can take intramuscular.   Shellfish Allergy Swelling   Shellfish-Derived Products Swelling   Sulfa Antibiotics Rash   Sulfasalazine Rash   Tetracycline Rash    rash   Tetracyclines & Related Rash    rash    Macrobid [Nitrofurantoin Monohyd Macro] Swelling   Tape Other (See Comments)    Causes redness and when removed after a long period of time tears skin. Pads for leads cause itching   Other Other (See Comments)    Avoids milk alone, can tolerate other milk ingredients. MM, RD 10/24/18  Vitamins given with TPN infusion   Sulfamethoxazole-Trimethoprim Rash    Can't take by iv or by mouth       Review of Systems:  Constitutional: No recent illness  HEENT: No  headache, no vision change  Cardiac: No  chest pain, No  pressure, +palpitations  Respiratory:  No  shortness of breath. No  Cough  Gastrointestinal: No  abdominal pain, no change on bowel habits  Musculoskeletal: No new myalgia/arthralgia  Skin: No  Rash  Neurologic: No  weakness, +Dizziness  Psychiatric: No  concerns with depression, No  concerns with anxiety  Exam:  BP 122/74 (BP Location: Left Arm, Patient Position: Sitting, Cuff Size: Small)    Pulse 73    Temp 98.2 F (36.8 C) (Oral)    Wt 106 lb 11.2 oz (48.4 kg)    BMI 20.16 kg/m    Constitutional: VS see above. General Appearance: alert, well-developed, well-nourished, NAD  Eyes: Normal lids and conjunctive, non-icteric sclera  Ears, Nose, Mouth, Throat: MMM, Normal external inspection ears/nares/mouth/lips/gums.  Neck: No masses, trachea midline.   Respiratory: Normal respiratory effort. no wheeze, no rhonchi, no rales  Cardiovascular: S1/S2 normal, no murmur, no rub/gallop auscultated. RRR.   Musculoskeletal: Gait normal. Symmetric and independent movement of all extremities  Neurological: Normal balance/coordination. No tremor. No nystagmus, EOMI w/ reproduction of dizziness looking R. Dix-Hallpike elicits vertigo to the R, Epley maneuvers resulted in resolution of symptoms   Skin: warm, dry, intact.   Psychiatric: Normal judgment/insight. Normal mood and affect. Oriented  x3.       Visit summary with medication list and pertinent instructions was printed for patient to review, patient was advised to alert [place] if any updates are needed. All questions at time of visit were answered - patient instructed to contact office with any additional concerns. ER/RTC precautions were reviewed with the patient and understanding verbalized.   Note: Total time spent 25 minutes, greater than 50% of the visit was spent face-to-face counseling and coordinating care for the following: The encounter diagnosis was BPPV (benign paroxysmal positional vertigo), right..  Please note: voice recognition software was used to produce this document, and typos may escape review. Please contact Dr. Sheppard Coil for any needed clarifications.    Follow up plan: Return in about 6 months (around 08/31/2019) for [place] (call week prior to visit for lab orders).

## 2019-02-28 NOTE — Patient Instructions (Signed)
Benign Positional Vertigo Vertigo is the feeling that you or your surroundings are moving when they are not. Benign positional vertigo is the most common form of vertigo. This is usually a harmless condition (benign). This condition is positional. This means that symptoms are triggered by certain movements and positions. This condition can be dangerous if it occurs while you are doing something that could cause harm to you or others. This includes activities such as driving or operating machinery. What are the causes? In many cases, the cause of this condition is not known. It may be caused by a disturbance in an area of the inner ear that helps your brain to sense movement and balance. This disturbance can be caused by:  Viral infection (labyrinthitis).  Head injury.  Repetitive motion, such as jumping, dancing, or running. What increases the risk? You are more likely to develop this condition if:  You are a woman.  You are 50 years of age or older. What are the signs or symptoms? Symptoms of this condition usually happen when you move your head or your eyes in different directions. Symptoms may start suddenly, and usually last for less than a minute. They include:  Loss of balance and falling.  Feeling like you are spinning or moving.  Feeling like your surroundings are spinning or moving.  Nausea and vomiting.  Blurred vision.  Dizziness.  Involuntary eye movement (nystagmus). Symptoms can be mild and cause only minor problems, or they can be severe and interfere with daily life. Episodes of benign positional vertigo may return (recur) over time. Symptoms may improve over time. How is this diagnosed? This condition may be diagnosed based on:  Your medical history.  Physical exam of the head, neck, and ears.  Tests, such as: ? MRI. ? CT scan. ? Eye movement tests. Your health care provider may ask you to change positions quickly while he or she watches you for symptoms  of benign positional vertigo, such as nystagmus. Eye movement may be tested with a variety of exams that are designed to evaluate or stimulate vertigo. ? An electroencephalogram (EEG). This records electrical activity in your brain. ? Hearing tests. You may be referred to a health care provider who specializes in ear, nose, and throat (ENT) problems (otolaryngologist) or a provider who specializes in disorders of the nervous system (neurologist). How is this treated?  This condition may be treated in a session in which your health care provider moves your head in specific positions to adjust your inner ear back to normal. Treatment for this condition may take several sessions. Surgery may be needed in severe cases, but this is rare. In some cases, benign positional vertigo may resolve on its own in 2-4 weeks. Follow these instructions at home: Safety  Move slowly. Avoid sudden body or head movements or certain positions, as told by your health care provider.  Avoid driving until your health care provider says it is safe for you to do so.  Avoid operating heavy machinery until your health care provider says it is safe for you to do so.  Avoid doing any tasks that would be dangerous to you or others if vertigo occurs.  If you have trouble walking or keeping your balance, try using a cane for stability. If you feel dizzy or unstable, sit down right away.  Return to your normal activities as told by your health care provider. Ask your health care provider what activities are safe for you. General instructions  Take over-the-counter   and prescription medicines only as told by your health care provider.  Drink enough fluid to keep your urine pale yellow.  Keep all follow-up visits as told by your health care provider. This is important. Contact a health care provider if:  You have a fever.  Your condition gets worse or you develop new symptoms.  Your family or friends notice any  behavioral changes.  You have nausea or vomiting that gets worse.  You have numbness or a "pins and needles" sensation. Get help right away if you:  Have difficulty speaking or moving.  Are always dizzy.  Faint.  Develop severe headaches.  Have weakness in your legs or arms.  Have changes in your hearing or vision.  Develop a stiff neck.  Develop sensitivity to light. Summary  Vertigo is the feeling that you or your surroundings are moving when they are not. Benign positional vertigo is the most common form of vertigo.  The cause of this condition is not known. It may be caused by a disturbance in an area of the inner ear that helps your brain to sense movement and balance.  Symptoms include loss of balance and falling, feeling that you or your surroundings are moving, nausea and vomiting, and blurred vision.  This condition can be diagnosed based on symptoms, physical exam, and other tests, such as MRI, CT scan, eye movement tests, and hearing tests.  Follow safety instructions as told by your health care provider. You will also be told when to contact your health care provider in case of problems. This information is not intended to replace advice given to you by your health care provider. Make sure you discuss any questions you have with your health care provider. Document Released: 04/19/2006 Document Revised: 12/21/2017 Document Reviewed: 12/21/2017 Elsevier Patient Education  2020 [employer].

## 2019-03-01 ENCOUNTER — Encounter: Payer: Self-pay | Admitting: Osteopathic Medicine

## 2019-03-10 DIAGNOSIS — Z9884 Bariatric surgery status: Secondary | ICD-10-CM | POA: Diagnosis not present

## 2019-03-10 DIAGNOSIS — M542 Cervicalgia: Secondary | ICD-10-CM | POA: Diagnosis not present

## 2019-03-10 DIAGNOSIS — R109 Unspecified abdominal pain: Secondary | ICD-10-CM | POA: Diagnosis not present

## 2019-03-10 DIAGNOSIS — Y92410 Unspecified street and highway as the place of occurrence of the external cause: Secondary | ICD-10-CM | POA: Diagnosis not present

## 2019-03-10 DIAGNOSIS — S80212A Abrasion, left knee, initial encounter: Secondary | ICD-10-CM | POA: Diagnosis not present

## 2019-03-10 DIAGNOSIS — S8001XA Contusion of right knee, initial encounter: Secondary | ICD-10-CM | POA: Diagnosis not present

## 2019-03-10 DIAGNOSIS — R110 Nausea: Secondary | ICD-10-CM | POA: Diagnosis not present

## 2019-03-10 DIAGNOSIS — R51 Headache: Secondary | ICD-10-CM | POA: Diagnosis not present

## 2019-03-13 ENCOUNTER — Encounter: Payer: Self-pay | Admitting: Osteopathic Medicine

## 2019-03-14 DIAGNOSIS — Z1231 Encounter for screening mammogram for malignant neoplasm of breast: Secondary | ICD-10-CM | POA: Diagnosis not present

## 2019-03-16 ENCOUNTER — Encounter: Payer: Self-pay | Admitting: Sports Medicine

## 2019-03-16 ENCOUNTER — Ambulatory Visit (INDEPENDENT_AMBULATORY_CARE_PROVIDER_SITE_OTHER): Payer: BC Managed Care – PPO | Admitting: Sports Medicine

## 2019-03-16 ENCOUNTER — Other Ambulatory Visit: Payer: Self-pay

## 2019-03-16 DIAGNOSIS — M25561 Pain in right knee: Secondary | ICD-10-CM | POA: Diagnosis not present

## 2019-03-16 DIAGNOSIS — M170 Bilateral primary osteoarthritis of knee: Secondary | ICD-10-CM | POA: Insufficient documentation

## 2019-03-16 DIAGNOSIS — M25562 Pain in left knee: Secondary | ICD-10-CM | POA: Diagnosis not present

## 2019-03-16 DIAGNOSIS — M2241 Chondromalacia patellae, right knee: Secondary | ICD-10-CM | POA: Insufficient documentation

## 2019-03-16 MED ORDER — DEXAMETHASONE 4 MG PO TABS: 4.0000 mg | ORAL_TABLET | Freq: Three times a day (TID) | ORAL | 0 refills | Status: DC
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
## 2019-03-19 ENCOUNTER — Encounter: Payer: Self-pay | Admitting: Sports Medicine

## 2019-03-19 ENCOUNTER — Ambulatory Visit (INDEPENDENT_AMBULATORY_CARE_PROVIDER_SITE_OTHER): Payer: BC Managed Care – PPO

## 2019-03-19 ENCOUNTER — Other Ambulatory Visit: Payer: Self-pay

## 2019-03-19 DIAGNOSIS — S8992XA Unspecified injury of left lower leg, initial encounter: Secondary | ICD-10-CM | POA: Diagnosis not present

## 2019-03-19 DIAGNOSIS — M25562 Pain in left knee: Secondary | ICD-10-CM | POA: Diagnosis not present

## 2019-03-19 DIAGNOSIS — S8991XA Unspecified injury of right lower leg, initial encounter: Secondary | ICD-10-CM | POA: Diagnosis not present

## 2019-03-19 DIAGNOSIS — M25561 Pain in right knee: Secondary | ICD-10-CM

## 2019-03-19 DIAGNOSIS — M542 Cervicalgia: Secondary | ICD-10-CM | POA: Diagnosis not present

## 2019-03-19 DIAGNOSIS — S199XXA Unspecified injury of neck, initial encounter: Secondary | ICD-10-CM | POA: Diagnosis not present
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
## 2019-03-20 DIAGNOSIS — D225 Melanocytic nevi of trunk: Secondary | ICD-10-CM | POA: Diagnosis not present

## 2019-03-20 DIAGNOSIS — B078 Other viral warts: Secondary | ICD-10-CM | POA: Diagnosis not present

## 2019-03-20 DIAGNOSIS — L918 Other hypertrophic disorders of the skin: Secondary | ICD-10-CM | POA: Diagnosis not present

## 2019-03-20 DIAGNOSIS — N951 Menopausal and female climacteric states: Secondary | ICD-10-CM | POA: Diagnosis not present

## 2019-03-20 DIAGNOSIS — B079 Viral wart, unspecified: Secondary | ICD-10-CM | POA: Diagnosis not present

## 2019-03-20 DIAGNOSIS — D239 Other benign neoplasm of skin, unspecified: Secondary | ICD-10-CM | POA: Diagnosis not present

## 2019-03-20 DIAGNOSIS — Z90710 Acquired absence of both cervix and uterus: Secondary | ICD-10-CM | POA: Diagnosis not present

## 2019-03-22 DIAGNOSIS — Z9889 Other specified postprocedural states: Secondary | ICD-10-CM | POA: Diagnosis not present

## 2019-03-22 DIAGNOSIS — R197 Diarrhea, unspecified: Secondary | ICD-10-CM | POA: Diagnosis not present

## 2019-03-27 DIAGNOSIS — Z853 Personal history of malignant neoplasm of breast: Secondary | ICD-10-CM | POA: Diagnosis not present

## 2019-03-27 DIAGNOSIS — Z9189 Other specified personal risk factors, not elsewhere classified: Secondary | ICD-10-CM | POA: Diagnosis not present

## 2019-03-27 DIAGNOSIS — Z86018 Personal history of other benign neoplasm: Secondary | ICD-10-CM | POA: Diagnosis not present

## 2019-03-27 DIAGNOSIS — Z803 Family history of malignant neoplasm of breast: Secondary | ICD-10-CM | POA: Diagnosis not present

## 2019-03-27 DIAGNOSIS — Z9889 Other specified postprocedural states: Secondary | ICD-10-CM | POA: Diagnosis not present

## 2019-03-27 DIAGNOSIS — R928 Other abnormal and inconclusive findings on diagnostic imaging of breast: Secondary | ICD-10-CM | POA: Diagnosis not present

## 2019-03-28 ENCOUNTER — Encounter: Payer: Self-pay | Admitting: Osteopathic Medicine

## 2019-04-03 ENCOUNTER — Ambulatory Visit: Payer: BC Managed Care – PPO | Admitting: Sports Medicine

## 2019-04-11 ENCOUNTER — Encounter: Payer: Self-pay | Admitting: Sports Medicine

## 2019-04-11 NOTE — Telephone Encounter (Signed)
In office please

## 2019-04-13 ENCOUNTER — Ambulatory Visit: Payer: BC Managed Care – PPO | Admitting: Sports Medicine

## 2019-04-18 DIAGNOSIS — Z01812 Encounter for preprocedural laboratory examination: Secondary | ICD-10-CM | POA: Diagnosis not present

## 2019-04-18 DIAGNOSIS — Z20828 Contact with and (suspected) exposure to other viral communicable diseases: Secondary | ICD-10-CM | POA: Diagnosis not present

## 2019-04-18 DIAGNOSIS — Z9884 Bariatric surgery status: Secondary | ICD-10-CM | POA: Diagnosis not present

## 2019-04-18 DIAGNOSIS — R112 Nausea with vomiting, unspecified: Secondary | ICD-10-CM | POA: Diagnosis not present

## 2019-04-23 ENCOUNTER — Other Ambulatory Visit: Payer: Self-pay

## 2019-04-23 ENCOUNTER — Ambulatory Visit (INDEPENDENT_AMBULATORY_CARE_PROVIDER_SITE_OTHER): Payer: BC Managed Care – PPO | Admitting: Sports Medicine

## 2019-04-23 ENCOUNTER — Encounter: Payer: Self-pay | Admitting: Sports Medicine

## 2019-04-23 DIAGNOSIS — K228 Other specified diseases of esophagus: Secondary | ICD-10-CM | POA: Diagnosis not present

## 2019-04-23 DIAGNOSIS — M25561 Pain in right knee: Secondary | ICD-10-CM | POA: Diagnosis not present

## 2019-04-23 DIAGNOSIS — Q398 Other congenital malformations of esophagus: Secondary | ICD-10-CM | POA: Diagnosis not present

## 2019-04-23 DIAGNOSIS — Z980 Intestinal bypass and anastomosis status: Secondary | ICD-10-CM | POA: Diagnosis not present

## 2019-04-23 DIAGNOSIS — R1310 Dysphagia, unspecified: Secondary | ICD-10-CM | POA: Diagnosis not present

## 2019-04-23 DIAGNOSIS — Z9884 Bariatric surgery status: Secondary | ICD-10-CM | POA: Diagnosis not present

## 2019-04-23 DIAGNOSIS — R112 Nausea with vomiting, unspecified: Secondary | ICD-10-CM | POA: Diagnosis not present

## 2019-04-23 DIAGNOSIS — J45909 Unspecified asthma, uncomplicated: Secondary | ICD-10-CM | POA: Diagnosis not present
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
## 2019-04-30 ENCOUNTER — Encounter: Payer: Self-pay | Admitting: Osteopathic Medicine

## 2019-04-30 NOTE — Addendum Note (Signed)
Addended by: Alena Bills R on: 04/30/2019 03:07 PM   Modules accepted: Orders

## 2019-05-01 ENCOUNTER — Telehealth (INDEPENDENT_AMBULATORY_CARE_PROVIDER_SITE_OTHER): Payer: BC Managed Care – PPO | Admitting: Family Medicine

## 2019-05-01 ENCOUNTER — Encounter: Payer: Self-pay | Admitting: Family Medicine

## 2019-05-01 VITALS — BP 120/70 | HR 105 | Temp 97.6°F

## 2019-05-01 DIAGNOSIS — J0110 Acute frontal sinusitis, unspecified: Secondary | ICD-10-CM | POA: Diagnosis not present

## 2019-05-01 MED ORDER — AMOXICILLIN-POT CLAVULANATE 875-125 MG PO TABS: 1.0000 | ORAL_TABLET | Freq: Two times a day (BID) | ORAL | 0 refills | Status: DC

## 2019-05-01 NOTE — Progress Notes (Signed)
Virtual Visit via Video Note  I connected with Rachel Rich on 05/01/19 at  2:00 PM EDT by a video enabled telemedicine application and verified that I am speaking with the correct person using two identifiers.   I discussed the limitations of evaluation and management by telemedicine and the availability of in person appointments. The patient expressed understanding and agreed to proceed.   I discussed the assessment and treatment plan with the patient. The patient was provided an opportunity to ask questions and all were answered. The patient agreed with the plan and demonstrated an understanding of the instructions.   The patient was advised to call back or seek an in-person evaluation if the symptoms worsen or if the condition fails to improve as anticipated.     Acute Office Visit  Subjective:    Patient ID: Rachel Rich, female    DOB: 07-Sep-1970, 48 y.o.   MRN: [id number]  Chief Complaint  Patient presents with  . Sinusitis    sxs x 2 wks. friday cheeks,ears,eye pain and headache. constant nasal drip and stuffy on the L side.she has been taking Sudafed     HPI Patient is in today for sinus symptoms for 2 weeks.  She has had cheek, ear and eye pain with persistent headache starting Friday.  She also complains of a constant nasal drip and feeling stuffy particularly on the left side of her nose.  She is been mostly using over-the-counter Sudafed. No ST. Yellow drainage.  Not on allergy weeks. No loss of taste or smell.  No GI sxs.  No fever, chills or sweats.  She reports a history of a deviated nasal septum which has never been repaired.  She says she has gotten a sinus infection in the spring and the fall almost every year for life since she was a teenager.  Past Medical History:  Diagnosis Date  . Asthma, mild persistent 07/25/2014   Symbicort 160-4.5 outside records   . Fibromyalgia 03/26/2014   Tramadol on outside records.   . Hyperlipidemia   . Menopause 07/25/2014   Estradiol 2mg  QD on outside records.   . Other migraine with status migrainosus, not intractable 03/26/2014  . RLS (restless legs syndrome) 03/26/2014  . Rosacea 07/25/2014    Past Surgical History:  Procedure Laterality Date  . ABDOMINAL HYSTERECTOMY  2009   masses on ovaries, endometriosis, no cancer diagnosis  . APPENDECTOMY  1979  . BREAST LUMPECTOMY Left 2010   pre-cancerous   . CESAREAN SECTION  2001,2007   x2   . GALLBLADDER SURGERY  2000  . HERNIA REPAIR  2009   ventral from c/s  . KNEE SURGERY Right    multiple procedures - arthroscopic   . TONSILLECTOMY  1976    Family History  Problem Relation Age of Onset  . Alcoholism Father   . Heart attack Father   . Hyperlipidemia Father   . Cancer Mother   . Depression Mother   . Stroke Mother   . Cancer Sister     Social History   Socioeconomic History  . Marital status: Married    Spouse name: Not on file  . Number of children: Not on file  . Years of education: Not on file  . Highest education level: Not on file  Occupational History  . Not on file  Social Needs  . Financial resource strain: Not on file  . Food insecurity    Worry: Not on file    Inability: Not on file  .  Transportation needs    Medical: Not on file    Non-medical: Not on file  Tobacco Use  . Smoking status: Never Smoker  . Smokeless tobacco: Never Used  Substance and Sexual Activity  . Alcohol use: No  . Drug use: Yes  . Sexual activity: Yes    Partners: Male    Birth control/protection: Other-see comments    Comment: s/p hysterectomy   Lifestyle  . Physical activity    Days per week: Not on file    Minutes per session: Not on file  . Stress: Not on file  Relationships  . Social Herbalist on phone: Not on file    Gets together: Not on file    Attends religious service: Not on file    Active member of club or organization: Not on file    Attends meetings of clubs or organizations: Not on file    Relationship status:  Not on file  . Intimate partner violence    Fear of current or ex partner: Not on file    Emotionally abused: Not on file    Physically abused: Not on file    Forced sexual activity: Not on file  Other Topics Concern  . Not on file  Social History Narrative  . Not on file    Outpatient Medications Prior to Visit  Medication Sig Dispense Refill  . ARIPiprazole (ABILIFY) 5 MG tablet Take 1 tablet (5 mg total) by mouth daily. 90 tablet 3  . loperamide (IMODIUM A-D) 2 MG tablet Take 1-2 tablets by mouth 2 (two) times daily.     No facility-administered medications prior to visit.     Allergies  Allergen Reactions  . Codeine Rash, Shortness Of Breath and Swelling  . Glucosamine Swelling  . Hydrocodone-Acetaminophen Swelling and Anaphylaxis  . Iodinated Diagnostic Agents Anaphylaxis and Rash    Rash and vomiting  Rash and vomiting  . Metrizamide Anaphylaxis    Rash and vomiting  . Morphine Shortness Of Breath, Swelling and Hives  . Morphine And Related Swelling, Rash and Shortness Of Breath  . Nitrofurantoin Swelling  . Prednisone Itching and Rash  . Sulfur Anaphylaxis  . Topiramate Swelling    High dose caused vision problems but low dose tolerates fine   . Tramadol Hives  . Cortisone Rash and Other (See Comments)    Anaphylaxis (outside records) however tolerated prednisone. Other reaction(s): Redness  Redness  . Demeclocycline Rash    rash   . Erythromycin Hives and Rash  . Hydrocodone Swelling  . Meperidine Other (See Comments), Itching and Rash    Can take intramuscular.  . Shellfish Allergy Swelling  . Shellfish-Derived Products Swelling  . Sulfa Antibiotics Rash  . Sulfasalazine Rash  . Tetracycline Rash    rash  . Tetracyclines & Related Rash    rash   . Macrobid [Nitrofurantoin Monohyd Macro] Swelling  . Tape Other (See Comments)    Causes redness and when removed after a long period of time tears skin. Pads for leads cause itching  . Latex Rash  .  Other Other (See Comments)    Avoids milk alone, can tolerate other milk ingredients. MM, RD 10/24/18  Vitamins given with TPN infusion  . Sulfamethoxazole-Trimethoprim Rash    Can't take by iv or by mouth    ROS     Objective:    Physical Exam  Constitutional: She is oriented to person, place, and time. She appears well-developed and well-nourished.  HENT:  Head: Normocephalic and atraumatic.  Eyes: Conjunctivae and EOM are normal.  Pulmonary/Chest: Effort normal.  Neurological: She is alert and oriented to person, place, and time.  Skin: Skin is dry. No pallor.  Psychiatric: She has a normal mood and affect. Her behavior is normal.  Vitals reviewed.   BP 120/70   Pulse (!) 105   Temp 97.6 F (36.4 C)   SpO2 100%  Wt Readings from Last 3 Encounters:  04/23/19 112 lb (50.8 kg)  03/16/19 109 lb (49.4 kg)  02/28/19 106 lb 11.2 oz (48.4 kg)    Health Maintenance Due  Topic Date Due  . HIV Screening  04/22/1985  . PAP SMEAR-Modifier  07/26/2016  . MAMMOGRAM  02/22/2019  . INFLUENZA VACCINE  02/24/2019    There are no preventive care reminders to display for this patient.   Lab Results  Component Value Date   TSH 1.32 07/27/2017   Lab Results  Component Value Date   WBC 6.0 07/27/2017   HGB 12.6 07/27/2017   HCT 39.0 07/27/2017   MCV 87.4 07/27/2017   PLT 208 07/27/2017   Lab Results  Component Value Date   NA 140 02/16/2019   K 4.1 02/16/2019   CO2 32 02/16/2019   GLUCOSE 86 02/16/2019   BUN 19 02/16/2019   CREATININE 0.83 02/16/2019   BILITOT 1.6 (H) 07/27/2017   ALKPHOS 76 03/03/2017   AST 96 (H) 07/27/2017   ALT 116 (H) 07/27/2017   PROT 6.2 07/27/2017   ALBUMIN 4.4 03/03/2017   CALCIUM 9.2 02/16/2019   Lab Results  Component Value Date   CHOL 192 02/16/2019   Lab Results  Component Value Date   HDL 55 02/16/2019   Lab Results  Component Value Date   LDLCALC 119 (H) 02/16/2019   Lab Results  Component Value Date   TRIG 79  02/16/2019   Lab Results  Component Value Date   CHOLHDL 3.5 02/16/2019   No results found for: HGBA1C     Assessment & Plan:   Problem List Items Addressed This Visit    None    Visit Diagnoses    Acute non-recurrent frontal sinusitis    -  Primary   Relevant Medications   amoxicillin-clavulanate (AUGMENTIN) 875-125 MG tablet     Acute sinusitis x2 weeks-we will go ahead and treat with Augmentin.  Call if not improving or if suddenly worse.  This does happen seasonally usually in the fall and in the spring she gets a sinus infection almost every year twice a year.  We also discussed a trial of Flonase in the spring about a month before she typically gets a sinus infection.  There is some good studies showing that nasal steroid spray can reduce the frequency of sinus infections.  Meds ordered this encounter  Medications  . amoxicillin-clavulanate (AUGMENTIN) 875-125 MG tablet    Sig: Take 1 tablet by mouth 2 (two) times daily.    Dispense:  20 tablet    Refill:  0     I discussed the assessment and treatment plan with the patient. The patient was provided an opportunity to ask questions and all were answered. The patient agreed with the plan and demonstrated an understanding of the instructions.   The patient was advised to call back or seek an in-person evaluation if the symptoms worsen or if the condition fails to improve as anticipated.  Beatrice Lecher, MD

## 2019-05-09 DIAGNOSIS — Z9884 Bariatric surgery status: Secondary | ICD-10-CM | POA: Diagnosis not present

## 2019-05-09 DIAGNOSIS — E43 Unspecified severe protein-calorie malnutrition: Secondary | ICD-10-CM | POA: Diagnosis not present

## 2019-05-18 ENCOUNTER — Telehealth: Payer: Self-pay | Admitting: Osteopathic Medicine

## 2019-05-18 ENCOUNTER — Encounter: Payer: Self-pay | Admitting: Osteopathic Medicine

## 2019-05-18 DIAGNOSIS — E162 Hypoglycemia, unspecified: Secondary | ICD-10-CM

## 2019-05-18 NOTE — Telephone Encounter (Signed)
Patient called stating she passed out at work and has not been able to keep blood sugar up. Spoke to [facility] in [place] and then advised patient to go to the ED or Urgent care as soon as possible. She then said she could not go due to price and hung up.

## 2019-05-18 NOTE — Telephone Encounter (Signed)
Noted, see my chart message.  I do agree with triage that syncopal episodes need to be investigated, certainly could be due to low blood sugar but I could not say whether this is the only thing going on

## 2019-05-22 ENCOUNTER — Ambulatory Visit (INDEPENDENT_AMBULATORY_CARE_PROVIDER_SITE_OTHER): Payer: BC Managed Care – PPO | Admitting: Sports Medicine

## 2019-05-22 ENCOUNTER — Encounter: Payer: Self-pay | Admitting: Sports Medicine

## 2019-05-22 DIAGNOSIS — K912 Postsurgical malabsorption, not elsewhere classified: Secondary | ICD-10-CM | POA: Diagnosis not present

## 2019-05-22 DIAGNOSIS — M2241 Chondromalacia patellae, right knee: Secondary | ICD-10-CM

## 2019-05-22 DIAGNOSIS — E041 Nontoxic single thyroid nodule: Secondary | ICD-10-CM | POA: Diagnosis not present

## 2019-05-22 DIAGNOSIS — M25561 Pain in right knee: Secondary | ICD-10-CM

## 2019-05-22 MED ORDER — ACETAMINOPHEN ER 650 MG PO TBCR: 650.0000 mg | EXTENDED_RELEASE_TABLET | Freq: Three times a day (TID) | ORAL | 3 refills | Status: DC | PRN
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
## 2019-05-27 ENCOUNTER — Other Ambulatory Visit: Payer: Self-pay

## 2019-05-27 ENCOUNTER — Ambulatory Visit (INDEPENDENT_AMBULATORY_CARE_PROVIDER_SITE_OTHER): Payer: BC Managed Care – PPO

## 2019-05-27 DIAGNOSIS — M2241 Chondromalacia patellae, right knee: Secondary | ICD-10-CM

## 2019-05-27 DIAGNOSIS — M25561 Pain in right knee: Secondary | ICD-10-CM | POA: Diagnosis not present

## 2019-05-27 DIAGNOSIS — S83511A Sprain of anterior cruciate ligament of right knee, initial encounter: Secondary | ICD-10-CM | POA: Diagnosis not present
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
## 2019-05-28 ENCOUNTER — Encounter: Payer: Self-pay | Admitting: Sports Medicine

## 2019-06-01 ENCOUNTER — Encounter: Payer: Self-pay | Admitting: Sports Medicine

## 2019-06-08 DIAGNOSIS — E042 Nontoxic multinodular goiter: Secondary | ICD-10-CM | POA: Diagnosis not present

## 2019-06-12 ENCOUNTER — Encounter: Payer: Self-pay | Admitting: Sports Medicine

## 2019-06-12 NOTE — Telephone Encounter (Signed)
Appt has been scheduled.     Thank you

## 2019-06-14 ENCOUNTER — Other Ambulatory Visit: Payer: Self-pay

## 2019-06-14 ENCOUNTER — Ambulatory Visit (INDEPENDENT_AMBULATORY_CARE_PROVIDER_SITE_OTHER): Payer: BC Managed Care – PPO | Admitting: Sports Medicine

## 2019-06-14 ENCOUNTER — Encounter: Payer: Self-pay | Admitting: Sports Medicine

## 2019-06-14 DIAGNOSIS — M170 Bilateral primary osteoarthritis of knee: Secondary | ICD-10-CM | POA: Diagnosis not present
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
## 2019-06-19 DIAGNOSIS — Z9884 Bariatric surgery status: Secondary | ICD-10-CM | POA: Diagnosis not present

## 2019-06-19 DIAGNOSIS — E162 Hypoglycemia, unspecified: Secondary | ICD-10-CM | POA: Diagnosis not present

## 2019-06-19 DIAGNOSIS — E041 Nontoxic single thyroid nodule: Secondary | ICD-10-CM | POA: Diagnosis not present

## 2019-06-26 ENCOUNTER — Encounter: Payer: Self-pay | Admitting: Sports Medicine

## 2019-06-26 ENCOUNTER — Other Ambulatory Visit: Payer: Self-pay

## 2019-06-26 ENCOUNTER — Encounter: Payer: Self-pay | Admitting: Osteopathic Medicine

## 2019-06-26 DIAGNOSIS — M170 Bilateral primary osteoarthritis of knee: Secondary | ICD-10-CM

## 2019-06-26 DIAGNOSIS — Z20822 Contact with and (suspected) exposure to covid-19: Secondary | ICD-10-CM

## 2019-06-26 NOTE — Telephone Encounter (Signed)
Good question, Barnet Pall?

## 2019-06-27 ENCOUNTER — Encounter: Payer: BC Managed Care – PPO | Admitting: Physician Assistant

## 2019-06-27 MED ORDER — SYNVISC 16 MG/2ML IX SOSY: PREFILLED_SYRINGE | INTRA_ARTICULAR | 3 refills | Status: DC

## 2019-06-27 NOTE — Telephone Encounter (Signed)
Thank you for looking into this, so to make absolutely sure am I definitely sending Synvisc to her local pharmacy [facility] or is there a specialty pharmacy?  I have never sent it to a local pharmacy before.

## 2019-06-27 NOTE — Telephone Encounter (Signed)
Patient does have a specialty pharmacy for Synvisc. [facility]. I have loaded the pharmacy.

## 2019-06-27 NOTE — Telephone Encounter (Signed)
I have just called the insurance and the patient will have to have this sent to a local pharmacy. I have called Autumn Patty and left a message. I am waiting to see what the next step is at this time.  No PA is needed and patient will have to try and fail Synvisc first. Reference number is [id number] per [place] representative at [facility].

## 2019-06-28 LAB — NOVEL CORONAVIRUS, NAA: SARS-CoV-2, NAA: NOT DETECTED

## 2019-06-29 NOTE — Progress Notes (Signed)
This encounter was created in error - please disregard.

## 2019-07-02 NOTE — Telephone Encounter (Signed)
Faxed insurance information to [employer].  Fax [phone] Phone [phone]

## 2019-07-03 NOTE — Telephone Encounter (Signed)
I called [facility] and spoke with Mateo Flow. She is going to get this as a high priority [facility] I have sent a mychart message to patient about calling speciality pharmacy to verify benefits at this time. I will get a message from speciality pharmacy once benefits are completed.

## 2019-07-06 NOTE — Telephone Encounter (Signed)
I called the pharmacy Mitzi Hansen and he reports that there is no PA for this injection is not needed. He is going to get this completed for next week shipment. Waiting on benefits investigation.

## 2019-07-09 DIAGNOSIS — M170 Bilateral primary osteoarthritis of knee: Secondary | ICD-10-CM | POA: Diagnosis not present

## 2019-07-09 DIAGNOSIS — M810 Age-related osteoporosis without current pathological fracture: Secondary | ICD-10-CM | POA: Diagnosis not present

## 2019-07-09 DIAGNOSIS — Z9884 Bariatric surgery status: Secondary | ICD-10-CM | POA: Diagnosis not present

## 2019-07-09 NOTE — Telephone Encounter (Signed)
I called the pharmacy to check on shipment and per [place] she has set up shipment for 07/10/2019. Patient is aware they are approved and waiting on injections to come in the office and I will schedule her.

## 2019-07-10 NOTE — Telephone Encounter (Signed)
Patient has appointment 07/11/2019 for first set of knee injections. No other questions at this time.

## 2019-07-11 ENCOUNTER — Ambulatory Visit (INDEPENDENT_AMBULATORY_CARE_PROVIDER_SITE_OTHER): Payer: BC Managed Care – PPO | Admitting: Sports Medicine

## 2019-07-11 ENCOUNTER — Other Ambulatory Visit: Payer: Self-pay

## 2019-07-11 DIAGNOSIS — M170 Bilateral primary osteoarthritis of knee: Secondary | ICD-10-CM | POA: Diagnosis not present

## 2019-07-11 NOTE — Progress Notes (Signed)
   Procedure: Real-time Ultrasound Guided injection of the left knee Device: Samsung HS60  Verbal informed consent obtained.  Time-out conducted.  Noted no overlying erythema, induration, or other signs of local infection.  Skin prepped in a sterile fashion.  Local anesthesia: Topical Ethyl chloride.  With sterile technique and under real time ultrasound guidance:  1 syringe of Synvisc injected into the knee with a 22-gauge needle. Completed without difficulty  Pain immediately resolved suggesting accurate placement of the medication.  Advised to call if fevers/chills, erythema, induration, drainage, or persistent bleeding.  Images permanently stored and available for review in the ultrasound unit.  Impression: Technically successful ultrasound guided injection.  Procedure: Real-time Ultrasound Guided injection of the right knee Device: Samsung HS60  Verbal informed consent obtained.  Time-out conducted.  Noted no overlying erythema, induration, or other signs of local infection.  Skin prepped in a sterile fashion.  Local anesthesia: Topical Ethyl chloride.  With sterile technique and under real time ultrasound guidance:  1 syringe of Synvisc injected into the knee with a 22-gauge needle. Completed without difficulty  Pain immediately resolved suggesting accurate placement of the medication.  Advised to call if fevers/chills, erythema, induration, drainage, or persistent bleeding.  Images permanently stored and available for review in the ultrasound unit.  Impression: Technically successful ultrasound guided injection.

## 2019-07-11 NOTE — Assessment & Plan Note (Signed)
Synvisc injection #1 of 3 into both knees, return in 1 week for Synvisc injection #2 of 3, both knees.

## 2019-07-12 DIAGNOSIS — M818 Other osteoporosis without current pathological fracture: Secondary | ICD-10-CM | POA: Diagnosis not present

## 2019-07-12 DIAGNOSIS — Z9884 Bariatric surgery status: Secondary | ICD-10-CM | POA: Diagnosis not present

## 2019-07-12 DIAGNOSIS — M810 Age-related osteoporosis without current pathological fracture: Secondary | ICD-10-CM | POA: Diagnosis not present

## 2019-07-18 ENCOUNTER — Ambulatory Visit: Payer: BC Managed Care – PPO | Admitting: Sports Medicine

## 2019-07-18 ENCOUNTER — Other Ambulatory Visit: Payer: Self-pay

## 2019-07-18 DIAGNOSIS — M170 Bilateral primary osteoarthritis of knee: Secondary | ICD-10-CM

## 2019-07-18 NOTE — Assessment & Plan Note (Signed)
Synvisc injection #2 of 3 into both knees, return in 1 week for #3 of 3.

## 2019-07-18 NOTE — Progress Notes (Signed)
   Procedure: Real-time Ultrasound Guidedinjection of the left knee Device: Samsung HS60  Verbal informed consent obtained.  Time-out conducted.  Noted no overlying erythema, induration, or other signs of local infection.  Skin prepped in a sterile fashion.  Local anesthesia: Topical Ethyl chloride.  With sterile technique and under real time ultrasound guidance: 1 syringe of Synvisc injected into the knee with a 22-gauge needle. Completed without difficulty  Pain immediately resolved suggesting accurate placement of the medication.  Advised to call if fevers/chills, erythema, induration, drainage, or persistent bleeding.  Images permanently stored and available for review in the ultrasound unit.  Impression: Technically successful ultrasound guided injection.  Procedure: Real-time Ultrasound Guidedinjection of the right knee Device: Samsung HS60  Verbal informed consent obtained.  Time-out conducted.  Noted no overlying erythema, induration, or other signs of local infection.  Skin prepped in a sterile fashion.  Local anesthesia: Topical Ethyl chloride.  With sterile technique and under real time ultrasound guidance: 1 syringe of Synvisc injected into the knee with a 22-gauge needle. Completed without difficulty  Pain immediately resolved suggesting accurate placement of the medication.  Advised to call if fevers/chills, erythema, induration, drainage, or persistent bleeding.  Images permanently stored and available for review in the ultrasound unit.  Impression: Technically successful ultrasound guided injection.

## 2019-07-19 DIAGNOSIS — M810 Age-related osteoporosis without current pathological fracture: Secondary | ICD-10-CM | POA: Diagnosis not present

## 2019-07-19 DIAGNOSIS — Z9884 Bariatric surgery status: Secondary | ICD-10-CM | POA: Diagnosis not present

## 2019-07-19 DIAGNOSIS — Z01818 Encounter for other preprocedural examination: Secondary | ICD-10-CM | POA: Diagnosis not present

## 2019-07-24 ENCOUNTER — Encounter: Payer: Self-pay | Admitting: Osteopathic Medicine

## 2019-07-24 MED ORDER — VALACYCLOVIR HCL 1 G PO TABS: 1000.0000 mg | ORAL_TABLET | Freq: Three times a day (TID) | ORAL | 0 refills | Status: AC

## 2019-07-24 NOTE — Telephone Encounter (Signed)
RX pended from historical med list. Please send if ok

## 2019-07-25 ENCOUNTER — Ambulatory Visit (INDEPENDENT_AMBULATORY_CARE_PROVIDER_SITE_OTHER): Payer: BC Managed Care – PPO | Admitting: Sports Medicine

## 2019-07-25 ENCOUNTER — Other Ambulatory Visit: Payer: Self-pay

## 2019-07-25 DIAGNOSIS — M170 Bilateral primary osteoarthritis of knee: Secondary | ICD-10-CM | POA: Diagnosis not present

## 2019-07-25 NOTE — Assessment & Plan Note (Signed)
Synvisc No. 3 of 3 both knees, return in 1 month.

## 2019-07-25 NOTE — Progress Notes (Signed)
   Procedure: Real-time Ultrasound Guidedinjection of theleft knee Device: Samsung HS60  Verbal informed consent obtained.  Time-out conducted.  Noted no overlying erythema, induration, or other signs of local infection.  Skin prepped in a sterile fashion.  Local anesthesia: Topical Ethyl chloride.  With sterile technique and under real time ultrasound guidance:1 syringe of Synvisc injected into the knee with a 22-gauge needle. Completed without difficulty  Pain immediately resolved suggesting accurate placement of the medication.  Advised to call if fevers/chills, erythema, induration, drainage, or persistent bleeding.  Images permanently stored and available for review in the ultrasound unit.  Impression: Technically successful ultrasound guided injection.  Procedure: Real-time Ultrasound Guidedinjection of therightknee Device: Samsung HS60  Verbal informed consent obtained.  Time-out conducted.  Noted no overlying erythema, induration, or other signs of local infection.  Skin prepped in a sterile fashion.  Local anesthesia: Topical Ethyl chloride.  With sterile technique and under real time ultrasound guidance:1 syringe of Synvisc injected into the knee with a 22-gauge needle. Completed without difficulty  Pain immediately resolved suggesting accurate placement of the medication.  Advised to call if fevers/chills, erythema, induration, drainage, or persistent bleeding.  Images permanently stored and available for review in the ultrasound unit.  Impression: Technically successful ultrasound guided injection.

## 2019-08-27 ENCOUNTER — Encounter: Payer: Self-pay | Admitting: Osteopathic Medicine

## 2019-08-30 DIAGNOSIS — M810 Age-related osteoporosis without current pathological fracture: Secondary | ICD-10-CM | POA: Diagnosis not present

## 2019-09-04 ENCOUNTER — Telehealth (INDEPENDENT_AMBULATORY_CARE_PROVIDER_SITE_OTHER): Payer: BC Managed Care – PPO | Admitting: Nurse Practitioner

## 2019-09-04 ENCOUNTER — Encounter: Payer: Self-pay | Admitting: Nurse Practitioner

## 2019-09-04 DIAGNOSIS — N3281 Overactive bladder: Secondary | ICD-10-CM

## 2019-09-04 MED ORDER — OXYBUTYNIN CHLORIDE ER 5 MG PO TB24: 5.0000 mg | ORAL_TABLET | Freq: Every day | ORAL | 2 refills | Status: DC

## 2019-09-04 MED ORDER — OXYBUTYNIN CHLORIDE ER 5 MG PO TB24: 5.0000 mg | ORAL_TABLET | Freq: Every day | ORAL | 0 refills | Status: DC

## 2019-09-04 NOTE — Patient Instructions (Signed)
Overactive Bladder, Adult  Overactive bladder refers to a condition in which a person has a sudden need to pass urine. The person may leak urine if he or she cannot get to the bathroom fast enough (urinary incontinence). A person with this condition may also wake up several times in the night to go to the bathroom. Overactive bladder is associated with poor nerve signals between your bladder and your brain. Your bladder may get the signal to empty before it is full. You may also have very sensitive muscles that make your bladder squeeze too soon. These symptoms might interfere with daily work or social activities. What are the causes? This condition may be associated with or caused by:  Urinary tract infection.  Infection of nearby tissues, such as the prostate.  Prostate enlargement.  Surgery on the uterus or urethra.  Bladder stones, inflammation, or tumors.  Drinking too much caffeine or alcohol.  Certain medicines, especially medicines that get rid of extra fluid in the body (diuretics).  Muscle or nerve weakness, especially from: ? A spinal cord injury. ? Stroke. ? Multiple sclerosis. ? Parkinson's disease.  Diabetes.  Constipation. What increases the risk? You may be at greater risk for overactive bladder if you:  Are an older adult.  Smoke.  Are going through menopause.  Have prostate problems.  Have a neurological disease, such as stroke, dementia, Parkinson's disease, or multiple sclerosis (MS).  Eat or drink things that irritate the bladder. These include alcohol, spicy food, and caffeine.  Are overweight or obese. What are the signs or symptoms? Symptoms of this condition include:  Sudden, strong urge to urinate.  Leaking urine.  Urinating 8 or more times a day.  Waking up to urinate 2 or more times a night. How is this diagnosed? Your health care provider may suspect overactive bladder based on your symptoms. He or she will diagnose this condition  by:  A physical exam and medical history.  Blood or urine tests. You might need bladder or urine tests to help determine what is causing your overactive bladder. You might also need to see a health care provider who specializes in urinary tract problems (urologist). How is this treated? Treatment for overactive bladder depends on the cause of your condition and whether it is mild or severe. You can also make lifestyle changes at home. Options include:  Bladder training. This may include: ? Learning to control the urge to urinate by following a schedule that directs you to urinate at regular intervals (timed voiding). ? Doing Kegel exercises to strengthen your pelvic floor muscles, which support your bladder. Toning these muscles can help you control urination, even if your bladder muscles are overactive.  Special devices. This may include: ? Biofeedback, which uses sensors to help you become aware of your body's signals. ? Electrical stimulation, which uses electrodes placed inside the body (implanted) or outside the body. These electrodes send gentle pulses of electricity to strengthen the nerves or muscles that control the bladder. ? Women may use a plastic device that fits into the vagina and supports the bladder (pessary).  Medicines. ? Antibiotics to treat bladder infection. ? Antispasmodics to stop the bladder from releasing urine at the wrong time. ? Tricyclic antidepressants to relax bladder muscles. ? Injections of botulinum toxin type A directly into the bladder tissue to relax bladder muscles.  Lifestyle changes. This may include: ? Weight loss. Talk to your health care provider about weight loss methods that would work best for you. ?   Diet changes. This may include reducing how much alcohol and caffeine you consume, or drinking fluids at different times of the day. ? Not smoking. Do not use any products that contain nicotine or tobacco, such as cigarettes and e-cigarettes. If  you need help quitting, ask your health care provider.  Surgery. ? A device may be implanted to help manage the nerve signals that control urination. ? An electrode may be implanted to stimulate electrical signals in the bladder. ? A procedure may be done to change the shape of the bladder. This is done only in very severe cases. Follow these instructions at home: Lifestyle  Make any diet or lifestyle changes that are recommended by your health care provider. These may include: ? Drinking less fluid or drinking fluids at different times of the day. ? Cutting down on caffeine or alcohol. ? Doing Kegel exercises. ? Losing weight if needed. ? Eating a healthy and balanced diet to prevent constipation. This may include:  Eating foods that are high in fiber, such as fresh fruits and vegetables, whole grains, and beans.  Limiting foods that are high in fat and processed sugars, such as fried and sweet foods. General instructions  Take over-the-counter and prescription medicines only as told by your health care provider.  If you were prescribed an antibiotic medicine, take it as told by your health care provider. Do not stop taking the antibiotic even if you start to feel better.  Use any implants or pessary as told by your health care provider.  If needed, wear pads to absorb urine leakage.  Keep a journal or log to track how much and when you drink and when you feel the need to urinate. This will help your health care provider monitor your condition.  Keep all follow-up visits as told by your health care provider. This is important. Contact a health care provider if:  You have a fever.  Your symptoms do not get better with treatment.  Your pain and discomfort get worse.  You have more frequent urges to urinate. Get help right away if:  You are not able to control your bladder. Summary  Overactive bladder refers to a condition in which a person has a sudden need to pass  urine.  Several conditions may lead to an overactive bladder.  Treatment for overactive bladder depends on the cause and severity of your condition.  Follow your health care provider's instructions about lifestyle changes, doing Kegel exercises, keeping a journal, and taking medicines. This information is not intended to replace advice given to you by your health care provider. Make sure you discuss any questions you have with your health care provider.  Oxybutynin extended-release tablets What is this medicine? OXYBUTYNIN (ox i BYOO ti nin) is used to treat overactive bladder. This medicine reduces the amount of bathroom visits. It may also help to control wetting accidents. This medicine may be used for other purposes; ask your health care provider or pharmacist if you have questions. COMMON BRAND NAME(S): Ditropan XL What should I tell my health care provider before I take this medicine? They need to know if you have any of these conditions:  autonomic neuropathy  dementia  difficulty passing urine  glaucoma  intestinal obstruction  kidney disease  liver disease  myasthenia gravis  Parkinson's disease  an unusual or allergic reaction to oxybutynin, other medicines, foods, dyes, or preservatives  pregnant or trying to get pregnant  breast-feeding How should I use this medicine? Take this medicine  by mouth with a glass of water. Swallow whole, do not crush, cut, or chew. Follow the directions on the prescription label. You can take this medicine with or without food. Take your doses at regular intervals. Do not take your medicine more often than directed. Talk to your pediatrician regarding the use of this medicine in children. Special care may be needed. While this drug may be prescribed for children as young as 6 years for selected conditions, precautions do apply. Overdosage: If you think you have taken too much of this medicine contact a poison control center or  emergency room at once. NOTE: This medicine is only for you. Do not share this medicine with others. What if I miss a dose? If you miss a dose, take it as soon as you can. If it is almost time for your next dose, take only that dose. Do not take double or extra doses. What may interact with this medicine?  antihistamines for allergy, cough and cold  atropine  certain medicines for bladder problems like oxybutynin, tolterodine  certain medicines for Parkinson's disease like benztropine, trihexyphenidyl  certain medicines for stomach problems like dicyclomine, hyoscyamine  certain medicines for travel sickness like scopolamine  clarithromycin  erythromycin  ipratropium  medicines for fungal infections, like fluconazole, itraconazole, ketoconazole or voriconazole This list may not describe all possible interactions. Give your health care provider a list of all the medicines, herbs, non-prescription drugs, or dietary supplements you use. Also tell them if you smoke, drink alcohol, or use illegal drugs. Some items may interact with your medicine. What should I watch for while using this medicine? It may take a few weeks to notice the full benefit from this medicine. You may need to limit your intake tea, coffee, caffeinated sodas, and alcohol. These drinks may make your symptoms worse. You may get drowsy or dizzy. Do not drive, use machinery, or do anything that needs mental alertness until you know how this medicine affects you. Do not stand or sit up quickly, especially if you are an older patient. This reduces the risk of dizzy or fainting spells. Alcohol may interfere with the effect of this medicine. Avoid alcoholic drinks. Your mouth may get dry. Chewing sugarless gum or sucking hard candy, and drinking plenty of water may help. Contact your doctor if the problem does not go away or is severe. This medicine may cause dry eyes and blurred vision. If you wear contact lenses, you may  feel some discomfort. Lubricating drops may help. See your eyecare professional if the problem does not go away or is severe. You may notice the shells of the tablets in your stool from time to time. This is normal. Avoid extreme heat. This medicine can cause you to sweat less than normal. Your body temperature could increase to dangerous levels, which may lead to heat stroke. What side effects may I notice from receiving this medicine? Side effects that you should report to your doctor or health care professional as soon as possible:  allergic reactions like skin rash, itching or hives, swelling of the face, lips, or tongue  agitation  breathing problems  confusion  fever  flushing (reddening of the skin)  hallucinations  memory loss  pain or difficulty passing urine  palpitations  unusually weak or tired Side effects that usually do not require medical attention (report to your doctor or health care professional if they continue or are bothersome):  constipation  headache  sexual difficulties (impotence) This list may not  describe all possible side effects. Call your doctor for medical advice about side effects. You may report side effects to FDA at 1-800-FDA-1088. Where should I keep my medicine? Keep out of the reach of children. Store at room temperature between 15 and 30 degrees C (59 and 86 degrees F). Protect from moisture and humidity. Throw away any unused medicine after the expiration date. NOTE: This sheet is a summary. It may not cover all possible information. If you have questions about this medicine, talk to your doctor, pharmacist, or health care provider.  2020 Elsevier/Gold Standard (2013-09-27 10:57:06)  Document Revised: 11/02/2018 Document Reviewed: 07/28/2017 Elsevier Patient Education  [place].

## 2019-09-04 NOTE — Progress Notes (Signed)
Virtual Visit via MyChart Note  I connected with  Rachel Rich on 09/04/19 at  9:30 AM EST by the video enabled telemedicine application, MyChart, and verified that I am speaking with the correct person using two identifiers.   I introduced myself as a Designer, jewellery with the practice. We discussed the limitations of evaluation and management by telemedicine and the availability of in person appointments. The patient expressed understanding and agreed to proceed.  The patient is: at home I am: in the office  Subjective:    CC: overactive bladder  HPI: Rachel Rich is a 49 y.o. y/o female presenting via [place] today for symptoms associated with over active bladder for approximately the past 6 months.  She reports she has increased frequency with decreased output and it is progressively worsening.  She has tried Kegel exercises, double voiding, timed voiding, and decreased fluid intake without success.  She reports she is up at least 5 times a night to urinate and must urinate at least every hour during the day.  She drinks approximately 62 to 64 ounces of water or diluted tea during the day.  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  She had a hysterectomy approximately 11 years ago.  She denies incontinence, burning, back pain, spasms, lower abdominal pain, malodor.    Past medical history, Surgical history, Family history not pertinant except as noted below, Social history, Allergies, and medications have been entered into the medical record, reviewed, and corrections made.   Review of Systems:  General: No fevers, chills, fatigue, night sweats, weight loss.   Neuro: No headache, numbness, paresthesias, or change in mental status  GI: No abdominal pain, nausea, vomiting, diarrhea, changes in bowel habits, anorexia  GU: No dysuria, hematuria, pelvic  pain, urinary incontinence  Objective:    General: Speaking clearly in complete sentences without any shortness of breath.  Alert and oriented x3.  Normal judgment. No apparent acute distress.   Impression and Recommendations:    1. Overactive bladder Symptoms and presentation most correlate with overactive bladder.  Conservative measures have not been effective and patient symptoms are worsening. Will trial low-dose oxybutynin to see if this helps with symptoms.  Plan to start oxybutynin at 5 mg/day.  Patient instructed she may increase to 10 mg/day after 2 weeks if symptoms are not well controlled.  She will touch base and let me know if she is required to increase the dose. Patient educated on continuing conservative measures discussed in addition to the medication. Patient education provided on overactive bladder. Medication side effects discussed.  Plan to follow up in approximately 4 to 6 weeks if oxybutynin is working well for her or sooner if medication is not effective.  - oxybutynin (DITROPAN-XL) 5 MG 24 hr tablet; Take 1 tablet (5 mg total) by mouth daily. May increase to 10mg  by mouth after 2 weeks if no improvement in symptoms.  Dispense: 30 tablet; Refill: 2    I discussed the assessment and treatment plan with the patient. The patient was provided an opportunity to ask questions and all were answered. The patient agreed with the plan and demonstrated an understanding of the instructions.   The patient was advised to call back or seek an in-person evaluation if the symptoms worsen or if the condition fails to improve as anticipated.  I provided 35 minutes of  non-face-to-face interaction with this [facility] visit.   Orma Render, NP

## 2019-09-17 ENCOUNTER — Encounter: Payer: Self-pay | Admitting: Nurse Practitioner

## 2019-09-17 DIAGNOSIS — N3281 Overactive bladder: Secondary | ICD-10-CM

## 2019-09-17 NOTE — Telephone Encounter (Signed)
OK to send in new RX with sig BID?

## 2019-09-18 MED ORDER — OXYBUTYNIN CHLORIDE ER 10 MG PO TB24: 10.0000 mg | ORAL_TABLET | Freq: Every day | ORAL | 3 refills | Status: DC

## 2019-09-25 ENCOUNTER — Encounter: Payer: Self-pay | Admitting: Osteopathic Medicine

## 2019-09-25 ENCOUNTER — Other Ambulatory Visit: Payer: Self-pay

## 2019-09-25 ENCOUNTER — Ambulatory Visit (INDEPENDENT_AMBULATORY_CARE_PROVIDER_SITE_OTHER): Payer: BC Managed Care – PPO | Admitting: Osteopathic Medicine

## 2019-09-25 VITALS — BP 130/84 | HR 87 | Temp 98.0°F | Wt 129.0 lb

## 2019-09-25 DIAGNOSIS — G43811 Other migraine, intractable, with status migrainosus: Secondary | ICD-10-CM | POA: Diagnosis not present

## 2019-09-25 MED ORDER — KETOROLAC TROMETHAMINE 30 MG/ML IJ SOLN
30.0000 mg | Freq: Once | INTRAMUSCULAR | Status: AC
  Administered 2019-09-25: 30 mg via INTRAMUSCULAR

## 2019-09-25 MED ORDER — PROMETHAZINE HCL 25 MG/ML IJ SOLN
25.0000 mg | Freq: Once | INTRAMUSCULAR | Status: AC
  Administered 2019-09-25: 25 mg via INTRAMUSCULAR

## 2019-09-25 MED ORDER — DEXAMETHASONE SODIUM PHOSPHATE 4 MG/ML IJ SOLN
4.0000 mg | Freq: Once | INTRAMUSCULAR | Status: AC
  Administered 2019-09-25: 4 mg via INTRAMUSCULAR

## 2019-09-25 MED ORDER — SUMATRIPTAN SUCCINATE 25 MG PO TABS: 25.0000 mg | ORAL_TABLET | ORAL | 4 refills | Status: DC | PRN

## 2019-09-25 NOTE — Progress Notes (Signed)
Rachel Rich is a 49 y.o. female who presents to  [place] at [facility]  today, 09/25/19, seeking care for the following: Marland Kitchen Migraine - chronic w/ exacerbation     ASSESSMENT & PLAN with other pertinent history/findings:  The encounter diagnosis was Other migraine with status migrainosus, intractable.   . Migraine o Today 09/25/19 reports ongoing since Sunday (2 days ago), has tried imitrex, excedrin. Assoc w/ N/V.  o Last migraine about a year ago o Attributes this headache to increased stress, recently gor bifocals, doing a lot of computer work  o Previous meds: topiramate    Meds ordered this encounter  Medications  . ketorolac (TORADOL) 30 MG/ML injection 30 mg  . promethazine (PHENERGAN) injection 25 mg  . dexamethasone (DECADRON) injection 4 mg  . SUMAtriptan (IMITREX) 25 MG tablet    Sig: Take 1-2 tablets (25-50 mg total) by mouth every 2 (two) hours as needed for migraine or headache. May repeat in 2 hours if headache persists. Max daily dose 100 mg    Dispense:  10 tablet    Refill:  4       Follow-up instructions: Return if symptoms worsen or fail to improve.                                    BP 130/84 (BP Location: Left Arm, Patient Position: Sitting, Cuff Size: Small)   Pulse 87   Temp 98 F (36.7 C) (Oral)   Wt 129 lb (58.5 kg)   BMI 20.82 kg/m   Current Meds  Medication Sig  . acetaminophen (TYLENOL) 650 MG CR tablet Take 1 tablet (650 mg total) by mouth every 8 (eight) hours as needed for pain.  . ARIPiprazole (ABILIFY) 5 MG tablet Take 1 tablet (5 mg total) by mouth daily.  . Blood Glucose Monitoring Suppl (FIFTY50 GLUCOSE METER 2.0) w/Device KIT Use as instructed  . famotidine (PEPCID) 20 MG tablet Take 20 mg by mouth 2 (two) times daily.  . Hylan (SYNVISC) 16 MG/2ML SOSY Inject 1 syringe into the left and right knees weekly x3  . loperamide (IMODIUM A-D) 2 MG  tablet Take 1-2 tablets by mouth 2 (two) times daily.  Marland Kitchen oxybutynin (DITROPAN-XL) 10 MG 24 hr tablet Take 1 tablet (10 mg total) by mouth daily.  . promethazine (PHENERGAN) 25 MG tablet Take 25 mg by mouth every 6 (six) hours as needed.  . sucralfate (CARAFATE) 1 g tablet Take 1 g by mouth 4 (four) times daily.  . Vitamin D, Ergocalciferol, (DRISDOL) 1.25 MG (50000 UNIT) CAPS capsule    Current Facility-Administered Medications for the 09/25/19 encounter (Office Visit) with Emeterio Reeve, DO  Medication  . dexamethasone (DECADRON) injection 4 mg  . ketorolac (TORADOL) 30 MG/ML injection 30 mg  . promethazine (PHENERGAN) injection 25 mg    No results found for this or any previous visit (from the past 85 hour(s)).  No results found.  Depression screen [facility] 2/9 06/06/2018 03/31/2017 03/03/2017  Decreased Interest 0 0 0  Down, Depressed, Hopeless 0 0 0  PHQ - 2 Score 0 0 0    GAD 7 : Generalized Anxiety Score 06/06/2018  Nervous, Anxious, on Edge 0  Control/stop worrying 0  Worry too much - different things 0  Trouble relaxing 0  Restless 0  Easily annoyed or irritable 0  Afraid - awful might happen 0  Total  GAD 7 Score 0      All questions at time of visit were answered - patient instructed to contact office with any additional concerns or updates.  ER/RTC precautions were reviewed with the patient.  Please note: voice recognition software was used to produce this document, and typos may escape review. Please contact Dr. Sheppard Coil for any needed clarifications.

## 2019-10-01 DIAGNOSIS — H16223 Keratoconjunctivitis sicca, not specified as Sjogren's, bilateral: Secondary | ICD-10-CM | POA: Diagnosis not present

## 2019-10-16 DIAGNOSIS — M810 Age-related osteoporosis without current pathological fracture: Secondary | ICD-10-CM | POA: Diagnosis not present

## 2020-01-07 ENCOUNTER — Other Ambulatory Visit: Payer: Self-pay | Admitting: Osteopathic Medicine

## 2020-01-24 ENCOUNTER — Encounter: Payer: Self-pay | Admitting: Osteopathic Medicine

## 2020-01-30 ENCOUNTER — Telehealth (INDEPENDENT_AMBULATORY_CARE_PROVIDER_SITE_OTHER): Payer: BC Managed Care – PPO | Admitting: Osteopathic Medicine

## 2020-01-30 ENCOUNTER — Encounter: Payer: Self-pay | Admitting: Osteopathic Medicine

## 2020-01-30 ENCOUNTER — Other Ambulatory Visit: Payer: Self-pay | Admitting: Osteopathic Medicine

## 2020-01-30 VITALS — Wt 130.0 lb

## 2020-01-30 DIAGNOSIS — G43811 Other migraine, intractable, with status migrainosus: Secondary | ICD-10-CM

## 2020-01-30 DIAGNOSIS — R635 Abnormal weight gain: Secondary | ICD-10-CM

## 2020-01-30 MED ORDER — FROVATRIPTAN SUCCINATE 2.5 MG PO TABS: 2.5000 mg | ORAL_TABLET | ORAL | 0 refills | Status: DC | PRN

## 2020-01-30 MED ORDER — TOPIRAMATE 25 MG PO TABS: 25.0000 mg | ORAL_TABLET | Freq: Every day | ORAL | 0 refills | Status: DC

## 2020-01-30 NOTE — Progress Notes (Signed)
Virtual Visit via Video (App used: Epic video) Note  I connected with      Rachel Rich on 01/30/20 at 5:15 PM  by a telemedicine application and verified that I am speaking with the correct person using two identifiers.  [place]. I am in office   I discussed the limitations of evaluation and management by telemedicine and the availability of in person appointments. The patient expressed understanding and agreed to proceed.  History of Present Illness: Rachel Rich is a 49 y.o. female who would like to discuss migraines. Patient reports more frequent migraines since January. She states she used to get migraines every 3 months or so, but recently they have increased to once every week or 2. She states the migraines feel the same as they always have with right sided head pain, right eye blurring, and vomiting, followed by a day of significant fatigue. In addition to the increased frequency of headaches, she is requiring more of her acute medication, 3 doses of imitrex over 4 hours to abate the headache. 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 She does a lot of computer work, but uses blue light filtering glasses. She states some of the migraines are preceded by hypoglycemic episodes, which have been happening since her bariatric surgery (following w/ endocrinology). She states she is having drops in her blood glucose 1-2 times per week, but usually she'll eat something and the episode will resolve. If she is unable to eat something though, the hypoglycemia will often cause migraines. She states she has been having difficulty eating as food as been getting caught in her throat. She states she has discussed this with her GI, but wasn't able to be scheduled to have this repaired until September.     Observations/Objective: Wt 130 lb (59 kg)   BMI 20.98 kg/m  BP Readings from Last 3 Encounters:  09/25/19 130/84  06/14/19 104/66   05/22/19 102/65    Lab and Radiology Results No results found for this or any previous visit (from the past 72 hour(s)). No results found.   Assessment and Plan: 49 y.o. female with exacerbation of chronic migraines. Patient requested to restart topiramate as this has worked well for her in the past. Agreed with this plan. Also prescribed frovatriptan for acute treatment instead of sumatriptan - pt can predict migraines, frova has longer DOA. Advised patient to let us know in 4-6 weeks if migraines have improved. On topamax before, had some issues w/ paresthesia but would rather try this again as opposed to new Rx, would discuss CGRPA or alternative, would also consider Nutec instead of triptan. If severe worsening ,would get MRI / neuro referral.   PDMP not reviewed this encounter. No orders of the defined types were placed in this encounter.  No orders of the defined types were placed in this encounter.  There are no Patient Instructions on file for this visit.    Follow Up Instructions: Return if symptoms worsen or fail to improve.    I discussed the assessment and treatment plan with the patient. The patient was provided an opportunity to ask questions and all were answered. The patient agreed with the plan and demonstrated an understanding of the instructions.   The patient was advised to call back or seek an in-person evaluation if any new concerns, if symptoms worsen or if the condition fails to improve as anticipated.  30 minutes of  non-face-to-face time was provided during this encounter.      . . . . . . . . . . . . . Marland Kitchen                   Historical information moved to improve visibility of documentation.  Past Medical History:  Diagnosis Date  . Asthma, mild persistent 07/25/2014   Symbicort 160-4.5 outside records   . Fibromyalgia 03/26/2014   Tramadol on outside records.   . Hyperlipidemia   . Menopause 07/25/2014   Estradiol 31m  QD on outside records.   . Other migraine with status migrainosus, not intractable 03/26/2014  . RLS (restless legs syndrome) 03/26/2014  . Rosacea 07/25/2014   Past Surgical History:  Procedure Laterality Date  . ABDOMINAL HYSTERECTOMY  2009   masses on ovaries, endometriosis, no cancer diagnosis  . APPENDECTOMY  1979  . BREAST LUMPECTOMY Left 2010   pre-cancerous   . CESAREAN SECTION  2001,2007   x2   . GALLBLADDER SURGERY  2000  . HERNIA REPAIR  2009   ventral from c/s  . KNEE SURGERY Right    multiple procedures - arthroscopic   . TONSILLECTOMY  1976   Social History   Tobacco Use  . Smoking status: Never Smoker  . Smokeless tobacco: Never Used  Substance Use Topics  . Alcohol use: No   family history includes Alcoholism in her father; Cancer in her mother and sister; Depression in her mother; Heart attack in her father; Hyperlipidemia in her father; Stroke in her mother.  Medications: Current Outpatient Medications  Medication Sig Dispense Refill  . acetaminophen (TYLENOL) 650 MG CR tablet Take 1 tablet (650 mg total) by mouth every 8 (eight) hours as needed for pain. 90 tablet 3  . ARIPiprazole (ABILIFY) 5 MG tablet Take 1 tablet (5 mg total) by mouth daily. 90 tablet 0  . Blood Glucose Monitoring Suppl (FIFTY50 GLUCOSE METER 2.0) w/Device KIT Use as instructed    . famotidine (PEPCID) 20 MG tablet Take 20 mg by mouth 2 (two) times daily.    . Hylan (SYNVISC) 16 MG/2ML SOSY Inject 1 syringe into the left and right knees weekly x3 12 mL 3  . loperamide (IMODIUM A-D) 2 MG tablet Take 1-2 tablets by mouth 2 (two) times daily.    .Marland Kitchenoxybutynin (DITROPAN-XL) 10 MG 24 hr tablet Take 1 tablet (10 mg total) by mouth daily. 90 tablet 3  . promethazine (PHENERGAN) 25 MG tablet Take 25 mg by mouth every 6 (six) hours as needed.    . sucralfate (CARAFATE) 1 g tablet Take 1 g by mouth 4 (four) times daily.    . SUMAtriptan (IMITREX) 25 MG tablet Take 1-2 tablets (25-50 mg total) by  mouth every 2 (two) hours as needed for migraine or headache. May repeat in 2 hours if headache persists. Max daily dose 100 mg 10 tablet 4  . Vitamin D, Ergocalciferol, (DRISDOL) 1.25 MG (50000 UNIT) CAPS capsule     . frovatriptan (FROVA) 2.5 MG tablet Take 1 tablet (2.5 mg total) by mouth as needed for migraine. If recurs, may repeat after 2 hours. Max of 3 tabs in 24 hours. 10 tablet 0  . topiramate (TOPAMAX) 25 MG tablet Take 1 tablet (25 mg total) by mouth daily. 90 tablet 0   No current facility-administered medications for this visit.   Allergies  Allergen Reactions  . Codeine Rash, Shortness Of Breath and Swelling  . Glucosamine Swelling  . Hydrocodone-Acetaminophen  Swelling and Anaphylaxis  . Iodinated Diagnostic Agents Anaphylaxis and Rash    Rash and vomiting  Rash and vomiting  . Metrizamide Anaphylaxis    Rash and vomiting  . Morphine Shortness Of Breath, Swelling and Hives  . Morphine And Related Swelling, Rash and Shortness Of Breath  . Nitrofurantoin Swelling  . Prednisone Itching and Rash  . Sulfur Anaphylaxis  . Topiramate Swelling    High dose caused vision problems but low dose tolerates fine   . Tramadol Hives  . Cortisone Rash and Other (See Comments)    Anaphylaxis (outside records) however tolerated prednisone. Other reaction(s): Redness  Redness  . Demeclocycline Rash    rash   . Erythromycin Hives and Rash  . Hydrocodone Swelling  . Meperidine Other (See Comments), Itching and Rash    Can take intramuscular.  . Shellfish Allergy Swelling  . Shellfish-Derived Products Swelling  . Sulfa Antibiotics Rash  . Sulfasalazine Rash  . Tetracycline Rash    rash  . Tetracyclines & Related Rash    rash   . Macrobid [Nitrofurantoin Monohyd Macro] Swelling  . Tape Other (See Comments)    Causes redness and when removed after a long period of time tears skin. Pads for leads cause itching  . Latex Rash  . Other Other (See Comments)    Avoids milk alone,  can tolerate other milk ingredients. MM, RD 10/24/18  Vitamins given with TPN infusion  . Sulfamethoxazole-Trimethoprim Rash    Can't take by iv or by mouth

## 2020-02-24 ENCOUNTER — Other Ambulatory Visit: Payer: Self-pay | Admitting: Osteopathic Medicine

## 2020-03-14 ENCOUNTER — Encounter: Payer: Self-pay | Admitting: Osteopathic Medicine

## 2020-04-05 ENCOUNTER — Other Ambulatory Visit: Payer: Self-pay | Admitting: Osteopathic Medicine

## 2020-04-18 ENCOUNTER — Encounter: Payer: Self-pay | Admitting: Family Medicine

## 2020-04-24 ENCOUNTER — Other Ambulatory Visit: Payer: Self-pay | Admitting: Osteopathic Medicine

## 2020-04-24 DIAGNOSIS — Z20822 Contact with and (suspected) exposure to covid-19: Secondary | ICD-10-CM | POA: Diagnosis not present

## 2020-04-24 DIAGNOSIS — R635 Abnormal weight gain: Secondary | ICD-10-CM

## 2020-05-12 DIAGNOSIS — Z03818 Encounter for observation for suspected exposure to other biological agents ruled out: Secondary | ICD-10-CM | POA: Diagnosis not present

## 2020-05-12 DIAGNOSIS — Z20822 Contact with and (suspected) exposure to covid-19: Secondary | ICD-10-CM | POA: Diagnosis not present

## 2020-05-22 ENCOUNTER — Other Ambulatory Visit: Payer: Self-pay

## 2020-05-22 ENCOUNTER — Ambulatory Visit (INDEPENDENT_AMBULATORY_CARE_PROVIDER_SITE_OTHER): Payer: BC Managed Care – PPO

## 2020-05-22 ENCOUNTER — Ambulatory Visit: Payer: BC Managed Care – PPO | Admitting: Sports Medicine

## 2020-05-22 DIAGNOSIS — M542 Cervicalgia: Secondary | ICD-10-CM

## 2020-05-22 DIAGNOSIS — S134XXA Sprain of ligaments of cervical spine, initial encounter: Secondary | ICD-10-CM | POA: Insufficient documentation

## 2020-05-22 DIAGNOSIS — R222 Localized swelling, mass and lump, trunk: Secondary | ICD-10-CM | POA: Diagnosis not present

## 2020-05-22 IMAGING — DX DG CERVICAL SPINE COMPLETE 4+V
6 series · 6 of 6 positions shown · non-contrast
Comparison: None.

CLINICAL DATA: Left neck pain/injury

EXAM:
CERVICAL SPINE - COMPLETE 4+ VIEW

[c-spine lat]
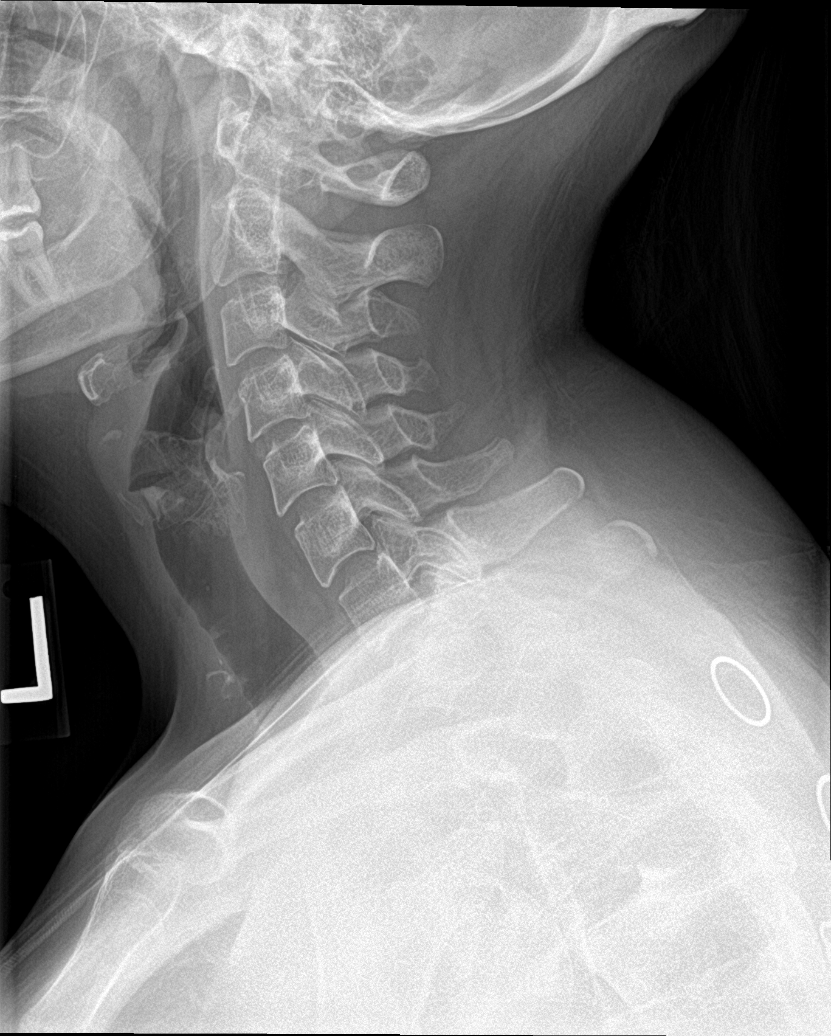

[c-spine obl (1 of 2)]
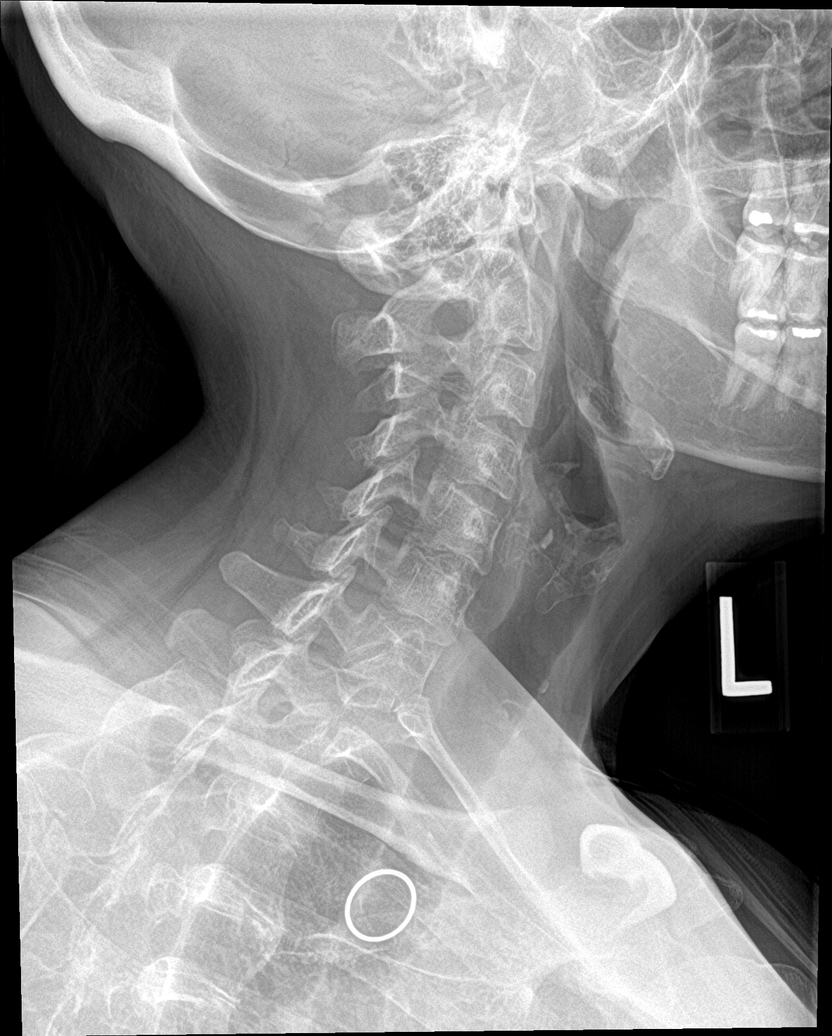

[c-spine obl (2 of 2)]
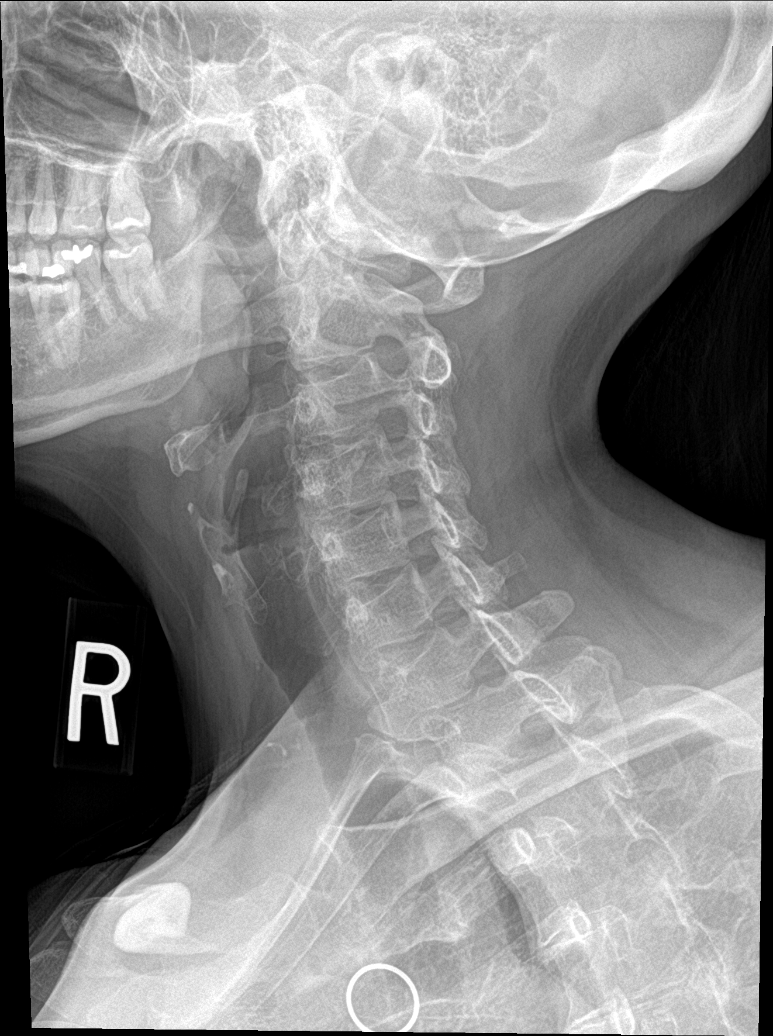

[c-spine ap]
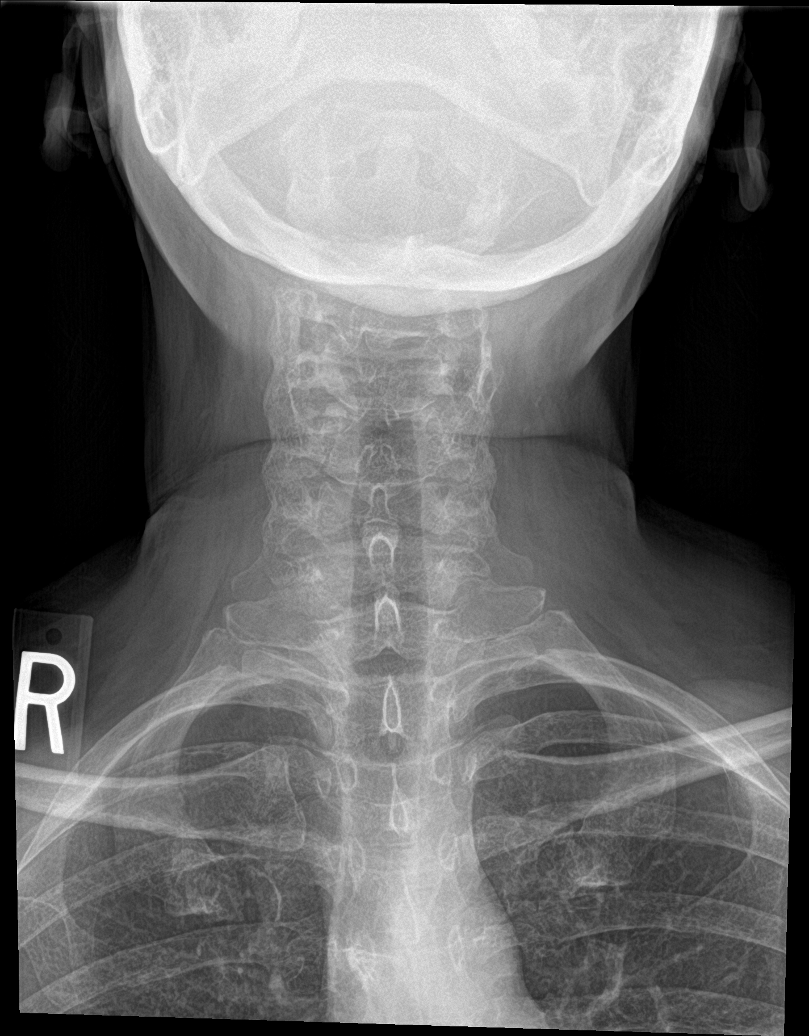

[c-spine open mouth]
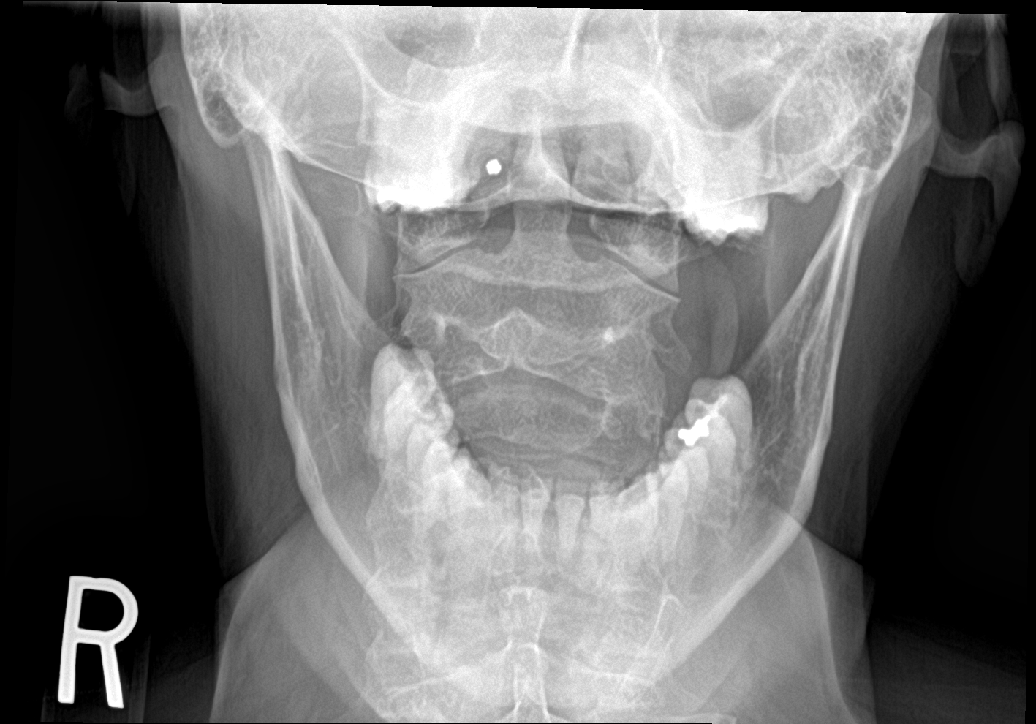

[c-spine swimmers]
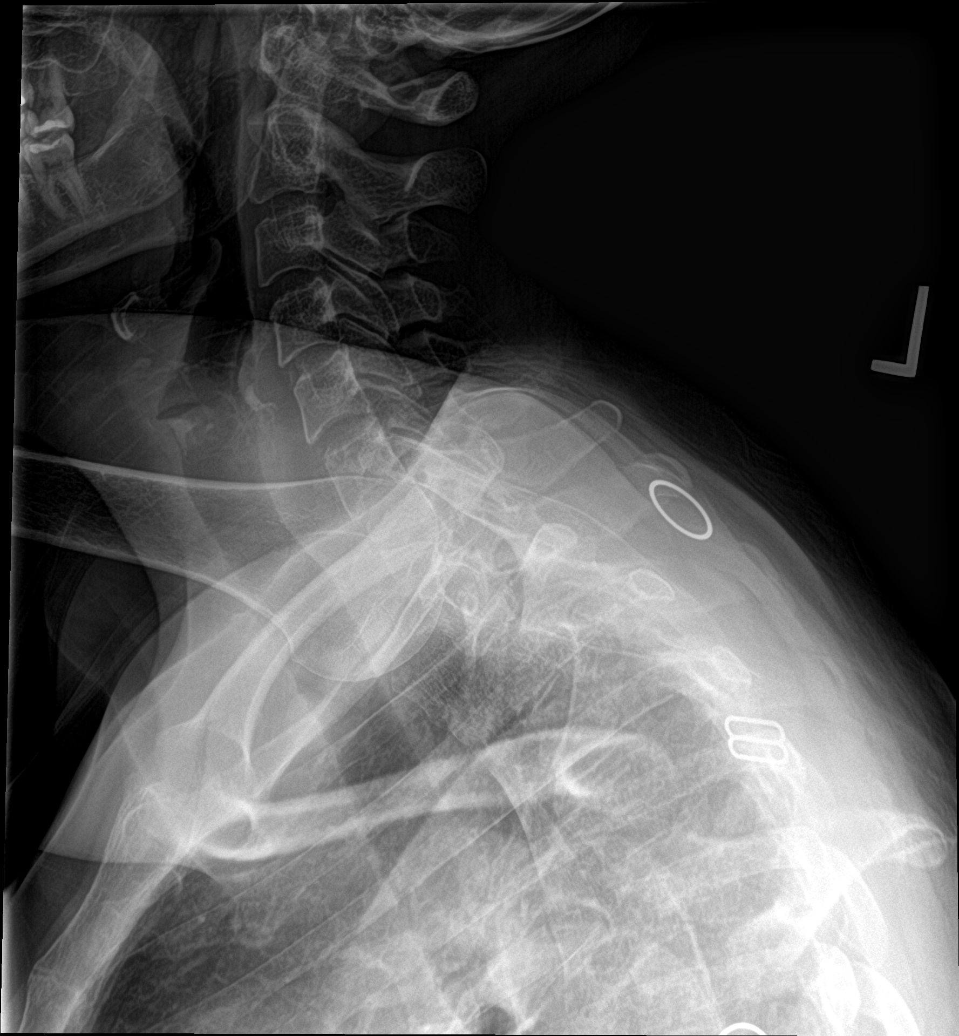

[6 of 6 positions shown; findings below may reference images not displayed]

FINDINGS: Normal cervical lordosis.

No evidence of fracture or dislocation. Vertebral body heights are
maintained. Dens appears intact. Lateral masses C1 are symmetric.

No prevertebral soft tissue swelling.

Bilateral neural foramina are patent.

Visualized lung apices are clear.
IMPRESSION: Negative cervical spine radiographs.

## 2020-05-22 MED ORDER — TRAMADOL HCL 50 MG PO TABS: 50.0000 mg | ORAL_TABLET | Freq: Three times a day (TID) | ORAL | 0 refills | Status: DC | PRN

## 2020-05-22 MED ORDER — DEXAMETHASONE 4 MG PO TABS: 4.0000 mg | ORAL_TABLET | Freq: Three times a day (TID) | ORAL | 0 refills | Status: DC

## 2020-05-22 NOTE — Progress Notes (Signed)
    Procedures performed today:    None.  Independent interpretation of notes and tests performed by another provider:   None.  Brief History, Exam, Impression, and Recommendations:    Injury of neck, whiplash Had an interesting episode, she was walking her dog, it is a 70 pound [employer], it pulled, she fell up the steps, the edge of the steps hit her in her left trapezius, now she has pain in her neck radiating up the posterior occiput, as well as down the left hand to the fourth and fifth fingers. She is having trouble sleeping. History of gastric bypass so avoiding NSAIDs. Adding Decadron as she declines prednisone, refilling tramadol for pain, physical therapy, soft collar. X-rays. Return to see me in a month.    ___________________________________________ Gwen Her. Dianah Field, M.D., ABFM., CAQSM. Primary Care and [place] Instructor of [place] [facility]

## 2020-05-22 NOTE — Assessment & Plan Note (Signed)
Had an interesting episode, she was walking her dog, it is a 70 pound [employer], it pulled, she fell up the steps, the edge of the steps hit her in her left trapezius, now she has pain in her neck radiating up the posterior occiput, as well as down the left hand to the fourth and fifth fingers. She is having trouble sleeping. History of gastric bypass so avoiding NSAIDs. Adding Decadron as she declines prednisone, refilling tramadol for pain, physical therapy, soft collar. X-rays. Return to see me in a month.

## 2020-05-30 ENCOUNTER — Encounter: Payer: Self-pay | Admitting: Osteopathic Medicine

## 2020-06-02 ENCOUNTER — Telehealth: Payer: Self-pay | Admitting: Osteopathic Medicine

## 2020-06-02 NOTE — Telephone Encounter (Signed)
Patient wanted to come in for a physical. Flu shot and shingles. Can the shingles and flu be done in one visit?

## 2020-06-02 NOTE — Telephone Encounter (Signed)
Answered in a MyChart message.

## 2020-06-05 ENCOUNTER — Other Ambulatory Visit: Payer: Self-pay

## 2020-06-05 ENCOUNTER — Ambulatory Visit: Payer: BC Managed Care – PPO | Admitting: Osteopathic Medicine

## 2020-06-05 ENCOUNTER — Encounter: Payer: Self-pay | Admitting: Osteopathic Medicine

## 2020-06-05 VITALS — BP 116/80 | HR 78 | Temp 98.7°F | Wt 147.1 lb

## 2020-06-05 DIAGNOSIS — Z0000 Encounter for general adult medical examination without abnormal findings: Secondary | ICD-10-CM

## 2020-06-05 DIAGNOSIS — Z9884 Bariatric surgery status: Secondary | ICD-10-CM | POA: Diagnosis not present

## 2020-06-05 DIAGNOSIS — Z23 Encounter for immunization: Secondary | ICD-10-CM | POA: Diagnosis not present

## 2020-06-05 DIAGNOSIS — Z1231 Encounter for screening mammogram for malignant neoplasm of breast: Secondary | ICD-10-CM | POA: Diagnosis not present

## 2020-06-05 MED ORDER — ARIPIPRAZOLE 5 MG PO TABS
5.0000 mg | ORAL_TABLET | Freq: Every day | ORAL | 3 refills | Status: DC
Start: 2020-06-05 — End: 2020-07-23

## 2020-06-05 MED ORDER — ARIPIPRAZOLE 5 MG PO TABS
5.0000 mg | ORAL_TABLET | Freq: Every day | ORAL | 3 refills | Status: DC
Start: 2020-06-05 — End: 2020-06-05

## 2020-06-05 NOTE — Progress Notes (Signed)
HPI: Rachel Rich is a 49 y.o. female who  has a past medical history of Asthma, mild persistent (07/25/2014), Fibromyalgia (03/26/2014), Hyperlipidemia, Menopause (07/25/2014), Other migraine with status migrainosus, not intractable (03/26/2014), RLS (restless legs syndrome) (03/26/2014), and Rosacea (07/25/2014).  she presents to [facility] today, 06/05/20,  for chief complaint of:  ANNUAL, LABS, REFILLS  Mild insomnia.     ASSESSMENT/PLAN: The primary encounter diagnosis was Annual physical exam. Diagnoses of Need for influenza vaccination, Need for shingles vaccine, and History of bariatric surgery were also pertinent to this visit.  Mild insomnia --> trial increase Abilify to 10 mg, can take melatonin 10 mg at dinnertime or bedtime   Orders Placed This Encounter  Procedures  . Varicella-zoster vaccine IM (Shingrix)  . Flu Vaccine QUAD 6+ mos PF IM (Fluarix Quad PF)  . Ferritin  . Folate  . Iron and TIBC  . Lipid panel  . Parathyroid hormone, intact (no Ca)  . VITAMIN D 25 Hydroxy (Vit-D Deficiency, Fractures)  . Vitamin B1  . Vitamin B12  . Zinc  . CBC  . COMPLETE METABOLIC PANEL WITH GFR  . Copper, serum     Meds ordered this encounter  Medications  . ARIPiprazole (ABILIFY) 5 MG tablet    Sig: Take 1 tablet (5 mg total) by mouth daily.    Dispense:  90 tablet    Refill:  3    Patient Instructions  General Preventive Care  Most recent routine screening labs: ordered today.   Blood pressure goal 130/80 or less.   Tobacco: don't!   Alcohol: responsible moderation is ok for most adults - if you have concerns about your alcohol intake, please talk to me!   Exercise: as tolerated to reduce risk of cardiovascular disease and diabetes. Strength training will also prevent osteoporosis.   Mental health: if need for mental health care (medicines, counseling, other), or concerns about moods, please let me know!   Sexual / Reproductive  health: if need for STD testing, or if concerns with libido/pain problems, please let me know!   Advanced Directive: Living Will and/or Healthcare Power of Attorney recommended for all adults, regardless of age or health.  Vaccines  Flu vaccine: every fall.   Shingles vaccine: after age 36. Second shot in 2-6 months   Pneumonia vaccines: after age 42, or sooner if certain medical conditions.  Tetanus booster: every 10 years, due 2029  COVID vaccine: THANKS for getting your vaccine! :)  Cancer screenings   Colon cancer screening: for everyone age 19-75. Colonoscopy available for all, many people also qualify for the Cologuard stool test   Breast cancer screening: mammogram annually after age 54.   Cervical cancer screening: Pap not needed w/ hysterectomy.   Lung cancer screening: CT chest every year for those aged 43 to 58 years who have a 20 pack-year smoking history and currently smoke or have quit within the past 15 years  Infection screenings  . HIV: recommended screening at least once age 11-65, more often as needed. . Gonorrhea/Chlamydia: screening as needed . Hepatitis C: recommended once for everyone age 78-75 . TB: certain at-risk populations, or depending on work requirements and/or travel history Other . Bone Density Test: baseline at age 12 given history of bariatric surgery      Follow-up plan: Return for NURSE VISIT [place] #2 IN 2-6 MOS .                                                 ################################################# ################################################# ################################################# #################################################  Current Meds  Medication Sig  . acetaminophen (TYLENOL) 650 MG CR tablet Take 1 tablet (650 mg total) by mouth every 8 (eight) hours as needed for pain.  . ARIPiprazole (ABILIFY) 5 MG tablet Take 1 tablet (5 mg total) by mouth  daily.  . famotidine (PEPCID) 20 MG tablet Take 20 mg by mouth 2 (two) times daily.  . frovatriptan (FROVA) 2.5 MG tablet TAKE 1 TAB BY MOUTH AS NEEDED FOR MIGRAINE. IF RECURS, MAY REPEAT AFTER 2 HOURS.MAX 3 TABS/24 HOURS.  Marland Kitchen loperamide (IMODIUM A-D) 2 MG tablet Take 1-2 tablets by mouth 2 (two) times daily.  Marland Kitchen oxybutynin (DITROPAN-XL) 10 MG 24 hr tablet Take 1 tablet (10 mg total) by mouth daily.  . sucralfate (CARAFATE) 1 g tablet Take 1 g by mouth 4 (four) times daily.  . SUMAtriptan (IMITREX) 25 MG tablet Take 1-2 tablets (25-50 mg total) by mouth every 2 (two) hours as needed for migraine or headache. May repeat in 2 hours if headache persists. Max daily dose 100 mg  . traMADol (ULTRAM) 50 MG tablet Take 1-2 tablets (50-100 mg total) by mouth every 8 (eight) hours as needed for moderate pain. Maximum 6 tabs per day.  . [DISCONTINUED] ARIPiprazole (ABILIFY) 5 MG tablet TAKE 1 TABLET BY MOUTH EVERY DAY  . [DISCONTINUED] dexamethasone (DECADRON) 4 MG tablet Take 1 tablet (4 mg total) by mouth 3 (three) times daily.  . [DISCONTINUED] Hylan (SYNVISC) 16 MG/2ML SOSY Inject 1 syringe into the left and right knees weekly x3    Allergies  Allergen Reactions  . Codeine Rash, Shortness Of Breath and Swelling  . Glucosamine Swelling  . Hydrocodone-Acetaminophen Swelling and Anaphylaxis  . Iodinated Diagnostic Agents Anaphylaxis and Rash    Rash and vomiting  Rash and vomiting  . Metrizamide Anaphylaxis    Rash and vomiting  . Morphine Shortness Of Breath, Swelling and Hives  . Morphine And Related Swelling, Rash and Shortness Of Breath  . Nitrofurantoin Swelling  . Prednisone Itching and Rash  . Sulfur Anaphylaxis  . Topiramate Swelling    High dose caused vision problems but low dose tolerates fine   . Tramadol Hives  . Cortisone Rash and Other (See Comments)    Anaphylaxis (outside records) however tolerated prednisone. Other reaction(s): Redness  Redness  . Demeclocycline Rash     rash   . Erythromycin Hives and Rash  . Hydrocodone Swelling  . Meperidine Other (See Comments), Itching and Rash    Can take intramuscular.  . Shellfish Allergy Swelling  . Shellfish-Derived Products Swelling  . Sulfa Antibiotics Rash  . Sulfasalazine Rash  . Tetracycline Rash    rash  . Tetracyclines & Related Rash    rash   . Macrobid [Nitrofurantoin Monohyd Macro] Swelling  . Tape Other (See Comments)    Causes redness and when removed after a long period of time tears skin. Pads for leads cause itching  . Latex Rash  . Other Other (See Comments)    Avoids milk alone, can tolerate other milk ingredients. MM, RD 10/24/18  Vitamins given with TPN infusion  . Sulfamethoxazole-Trimethoprim Rash    Can't take by iv or by mouth       Review of Systems: Pertinent (+) and (-) ROS in HPI as above   Exam:  BP 116/80 (BP Location: Left Arm, Patient Position: Sitting, Cuff Size: Normal)   Pulse 78   Temp 98.7 F (37.1 C) (Oral)   Wt 147 lb 1.3 oz (66.7 kg)  BMI 23.74 kg/m   Constitutional: VS see above. General Appearance: alert, well-developed, well-nourished, NAD  Neck: No masses, trachea midline.   Respiratory: Normal respiratory effort. no wheeze, no rhonchi, no rales  Cardiovascular: S1/S2 normal, no murmur, no rub/gallop auscultated. RRR.   Musculoskeletal: Gait normal. Symmetric and independent movement of all extremities  Abdominal: non-tender, non-distended, no appreciable organomegaly, neg Murphy's, BS [id number]  Neurological: Normal balance/coordination. No tremor.  Skin: warm, dry, intact.   Psychiatric: Normal judgment/insight. Normal mood and affect. Oriented x3.       Visit summary with medication list and pertinent instructions was printed for patient to review, patient was advised to alert [place] if any updates are needed. All questions at time of visit were answered - patient instructed to contact office with any additional concerns. ER/RTC precautions  were reviewed with the patient and understanding verbalized.       Please note: voice recognition software was used to produce this document, and typos may escape review. Please contact Dr. Sheppard Coil for any needed clarifications.    Follow up plan: Return for NURSE VISIT [place] #2 IN 2-6 MOS .

## 2020-06-05 NOTE — Patient Instructions (Signed)
General Preventive Care  Most recent routine screening labs: ordered today.   Blood pressure goal 130/80 or less.   Tobacco: don't!   Alcohol: responsible moderation is ok for most adults - if you have concerns about your alcohol intake, please talk to me!   Exercise: as tolerated to reduce risk of cardiovascular disease and diabetes. Strength training will also prevent osteoporosis.   Mental health: if need for mental health care (medicines, counseling, other), or concerns about moods, please let me know!   Sexual / Reproductive health: if need for STD testing, or if concerns with libido/pain problems, please let me know!   Advanced Directive: Living Will and/or Healthcare Power of Attorney recommended for all adults, regardless of age or health.  Vaccines  Flu vaccine: every fall.   Shingles vaccine: after age 67. Second shot in 2-6 months   Pneumonia vaccines: after age 37, or sooner if certain medical conditions.  Tetanus booster: every 10 years, due 2029  COVID vaccine: THANKS for getting your vaccine! :)  Cancer screenings   Colon cancer screening: for everyone age 47-75. Colonoscopy available for all, many people also qualify for the Cologuard stool test   Breast cancer screening: mammogram annually after age 69.   Cervical cancer screening: Pap not needed w/ hysterectomy.   Lung cancer screening: CT chest every year for those aged 50 to 42 years who have a 20 pack-year smoking history and currently smoke or have quit within the past 15 years  Infection screenings  . HIV: recommended screening at least once age 67-65, more often as needed. . Gonorrhea/Chlamydia: screening as needed . Hepatitis C: recommended once for everyone age 71-75 . TB: certain at-risk populations, or depending on work requirements and/or travel history Other . Bone Density Test: baseline at age 62 given history of bariatric surgery

## 2020-06-06 DIAGNOSIS — Z9884 Bariatric surgery status: Secondary | ICD-10-CM | POA: Diagnosis not present

## 2020-06-06 DIAGNOSIS — R1319 Other dysphagia: Secondary | ICD-10-CM | POA: Diagnosis not present

## 2020-06-23 ENCOUNTER — Ambulatory Visit: Payer: BC Managed Care – PPO | Admitting: Sports Medicine

## 2020-07-03 MED ORDER — TRAZODONE HCL 50 MG PO TABS
50.0000 mg | ORAL_TABLET | Freq: Every evening | ORAL | 3 refills | Status: DC | PRN
Start: 2020-07-03 — End: 2020-07-23

## 2020-07-03 NOTE — Addendum Note (Signed)
Addended by: Maryla Morrow on: 07/03/2020 04:36 PM   Modules accepted: Orders

## 2020-07-21 ENCOUNTER — Encounter: Payer: Self-pay | Admitting: Osteopathic Medicine

## 2020-07-23 ENCOUNTER — Encounter: Payer: Self-pay | Admitting: Osteopathic Medicine

## 2020-07-23 ENCOUNTER — Telehealth (INDEPENDENT_AMBULATORY_CARE_PROVIDER_SITE_OTHER): Payer: BC Managed Care – PPO | Admitting: Osteopathic Medicine

## 2020-07-23 DIAGNOSIS — M797 Fibromyalgia: Secondary | ICD-10-CM

## 2020-07-23 MED ORDER — TRAMADOL HCL 50 MG PO TABS: 50.0000 mg | ORAL_TABLET | Freq: Three times a day (TID) | ORAL | 0 refills | Status: DC | PRN

## 2020-07-23 MED ORDER — TRAZODONE HCL 100 MG PO TABS: 100.0000 mg | ORAL_TABLET | Freq: Every evening | ORAL | 1 refills | Status: DC | PRN

## 2020-07-23 MED ORDER — CYCLOBENZAPRINE HCL 10 MG PO TABS
5.0000 mg | ORAL_TABLET | Freq: Three times a day (TID) | ORAL | 1 refills | Status: DC | PRN
Start: 2020-07-23 — End: 2021-02-12

## 2020-07-23 MED ORDER — ARIPIPRAZOLE 10 MG PO TABS
10.0000 mg | ORAL_TABLET | Freq: Every day | ORAL | 3 refills | Status: DC
Start: 2020-07-23 — End: 2021-05-26

## 2020-07-23 NOTE — Patient Instructions (Addendum)
Plan:  Cyclobenzaprine added back today   OK to increase Traozonde to 150 if the cyclobenzaprine not working  Emergency Rx for Tramadol 50 mg (5 pills) at pharmacy, if you need this over the weekend, ok to fill, and then if you are finding you need addition pills, please contact the office on Monday. DO NOT take the Tramadol with high dose Trazodone, reduce dose Trazodone to 100 mg if you need the Tramadol.   If still having pain/swelling over the next 1-2 weeks, please schedule in-office follow-up with me!

## 2020-07-23 NOTE — Progress Notes (Signed)
Telemedicine Visit via  Video & Audio (App used: [id number])   I connected with Rachel Rich on 07/23/20 at 10:11 AM  by phone or  telemedicine application as noted above  I verified that I am speaking with or regarding  the correct patient using two identifiers.  Participants: Myself, Dr Emeterio Reeve DO Patient: Rachel Rich Patient proxy if applicable: none Other, if applicable: none  Patient is in separate location from myself - home I am in office at [facility]    I discussed the limitations of evaluation and management  by telemedicine and the availability of in person appointments.  The participant(s) above expressed understanding and  agreed to proceed with this appointment via telemedicine.       History of Present Illness: Rachel Rich is a 49 y.o. female who would like to discuss fibromyalgia flare    Started w/ trouble sleeping x1 month - interrupted sleep  Worse about 1.5 weeks ago w/ edema/pain in legs/hands on some days Aching/sore about 1.5 weeks ago, was concerned shemight have had flu (son was flu+)   Previous fibromyalgia treatments include... TCA: Amitriptyline none on file, Trazodone currently taking 100 mg qhs  SSRI/SNRI: Duloxetine No , currently on aripiprazole 5-10 mg daily Muscle Relaxer: Cyclobenzaprine last Rx on file 2017 - wasn't needing it especially after weight loss  Anticonvulsants: Gabapentin No , Pregabalin previously in 2017 -  NSAID not taking d/t gastric bpass physical activity less since working at home (started working form home last year)  Other: tramadol 50-100 mg q8h prn, 21 tablets Rx 05/22/20 by sports medicine d/t whiplash injury      Observations/Objective: Temp (!) 97.5 F (36.4 C)   Wt 143 lb (64.9 kg)   BMI 23.08 kg/m  BP Readings from Last 3 Encounters:  06/05/20 116/80  09/25/19 130/84  06/14/19 104/66   Exam: Normal Speech.  NAD  Lab and Radiology Results No results found for  this or any previous visit (from the past 72 hour(s)). No results found.     Assessment and Plan: 49 y.o. female with The encounter diagnosis was Fibromyalgia.  PDMP reviewed during this encounter. No orders of the defined types were placed in this encounter.  Meds ordered this encounter  Medications  . cyclobenzaprine (FLEXERIL) 10 MG tablet    Sig: Take 0.5-1 tablets (5-10 mg total) by mouth 3 (three) times daily as needed for muscle spasms. Caution: can cause drowsiness    Dispense:  60 tablet    Refill:  1  . traMADol (ULTRAM) 50 MG tablet    Sig: Take 1 tablet (50 mg total) by mouth every 8 (eight) hours as needed for moderate pain. 1-2 tabs by mouth Q8 hours, maximum 6 tabs per day.    Dispense:  5 tablet    Refill:  0  . ARIPiprazole (ABILIFY) 10 MG tablet    Sig: Take 1 tablet (10 mg total) by mouth daily.    Dispense:  90 tablet    Refill:  3  . traZODone (DESYREL) 100 MG tablet    Sig: Take 1-1.5 tablets (100-150 mg total) by mouth at bedtime as needed for sleep.    Dispense:  90 tablet    Refill:  1   Patient Instructions  Plan:  Cyclobenzaprine added back today   OK to increase Traozonde to 150 if the cyclobenzaprine not working  Emergency Rx for Tramadol 50 mg (5 pills) at pharmacy, if you need this over the weekend, ok to fill, and  then if you are finding you need addition pills, please contact the office on Monday. DO NOT take the Tramadol with high dose Trazodone, reduce dose Trazodone to 100 mg if you need the Tramadol.   If still having pain/swelling over the next 1-2 weeks, please schedule in-office follow-up with me!    Instructions sent via MyChart.   Follow Up Instructions: Return if symptoms worsen or fail to improve.    I discussed the assessment and treatment plan with the patient. The patient was provided an opportunity to ask questions and all were answered. The patient agreed with the plan and demonstrated an understanding of the  instructions.   The patient was advised to call back or seek an in-person evaluation if any new concerns, if symptoms worsen or if the condition fails to improve as anticipated.  30 minutes of non-face-to-face time was provided during this encounter.      . . . . . . . . . . . . . Marland Kitchen                   Historical information moved to improve visibility of documentation.  Past Medical History:  Diagnosis Date  . Asthma, mild persistent 07/25/2014   Symbicort 160-4.5 outside records   . Fibromyalgia 03/26/2014   Tramadol on outside records.   . Hyperlipidemia   . Menopause 07/25/2014   Estradiol 2mg  QD on outside records.   . Other migraine with status migrainosus, not intractable 03/26/2014  . RLS (restless legs syndrome) 03/26/2014  . Rosacea 07/25/2014   Past Surgical History:  Procedure Laterality Date  . ABDOMINAL HYSTERECTOMY  2009   masses on ovaries, endometriosis, no cancer diagnosis  . APPENDECTOMY  1979  . BARIATRIC SURGERY     done in 2010  . BREAST LUMPECTOMY Left 2010   pre-cancerous   . CESAREAN SECTION  2001,2007   x2   . GALLBLADDER SURGERY  2000  . HERNIA REPAIR  2009   ventral from c/s  . KNEE SURGERY Right    multiple procedures - arthroscopic   . TONSILLECTOMY  1976   Social History   Tobacco Use  . Smoking status: Never Smoker  . Smokeless tobacco: Never Used  Substance Use Topics  . Alcohol use: No   family history includes Alcoholism in her father; Cancer in her mother and sister; Depression in her mother; Heart attack in her father; Hyperlipidemia in her father; Stroke in her mother.  Medications: Current Outpatient Medications  Medication Sig Dispense Refill  . acetaminophen (TYLENOL) 650 MG CR tablet Take 1 tablet (650 mg total) by mouth every 8 (eight) hours as needed for pain. 90 tablet 3  . ARIPiprazole (ABILIFY) 10 MG tablet Take 1 tablet (10 mg total) by mouth daily. 90 tablet 3  . cyclobenzaprine  (FLEXERIL) 10 MG tablet Take 0.5-1 tablets (5-10 mg total) by mouth 3 (three) times daily as needed for muscle spasms. Caution: can cause drowsiness 60 tablet 1  . famotidine (PEPCID) 20 MG tablet Take 20 mg by mouth 2 (two) times daily.    . frovatriptan (FROVA) 2.5 MG tablet TAKE 1 TAB BY MOUTH AS NEEDED FOR MIGRAINE. IF RECURS, MAY REPEAT AFTER 2 HOURS.MAX 3 TABS/24 HOURS. 10 tablet 0  . loperamide (IMODIUM A-D) 2 MG tablet Take 1-2 tablets by mouth 2 (two) times daily.    2001 oxybutynin (DITROPAN-XL) 10 MG 24 hr tablet Take 1 tablet (10 mg total) by mouth daily. 90 tablet 3  .  sucralfate (CARAFATE) 1 g tablet Take 1 g by mouth 4 (four) times daily.    . SUMAtriptan (IMITREX) 25 MG tablet Take 1-2 tablets (25-50 mg total) by mouth every 2 (two) hours as needed for migraine or headache. May repeat in 2 hours if headache persists. Max daily dose 100 mg 10 tablet 4  . traMADol (ULTRAM) 50 MG tablet Take 1-2 tablets (50-100 mg total) by mouth every 8 (eight) hours as needed for moderate pain. Maximum 6 tabs per day. 21 tablet 0  . traMADol (ULTRAM) 50 MG tablet Take 1 tablet (50 mg total) by mouth every 8 (eight) hours as needed for moderate pain. 1-2 tabs by mouth Q8 hours, maximum 6 tabs per day. 5 tablet 0  . traZODone (DESYREL) 100 MG tablet Take 1-1.5 tablets (100-150 mg total) by mouth at bedtime as needed for sleep. 90 tablet 1   No current facility-administered medications for this visit.   Allergies  Allergen Reactions  . Codeine Rash, Shortness Of Breath and Swelling  . Glucosamine Swelling  . Hydrocodone-Acetaminophen Swelling and Anaphylaxis  . Iodinated Diagnostic Agents Anaphylaxis and Rash    Rash and vomiting  Rash and vomiting  . Metrizamide Anaphylaxis    Rash and vomiting  . Morphine Shortness Of Breath, Swelling and Hives  . Morphine And Related Swelling, Rash and Shortness Of Breath  . Nitrofurantoin Swelling  . Prednisone Itching and Rash  . Sulfur Anaphylaxis  .  Topiramate Swelling    High dose caused vision problems but low dose tolerates fine   . Tramadol Hives  . Cortisone Rash and Other (See Comments)    Anaphylaxis (outside records) however tolerated prednisone. Other reaction(s): Redness  Redness  . Demeclocycline Rash    rash   . Erythromycin Hives and Rash  . Hydrocodone Swelling  . Meperidine Other (See Comments), Itching and Rash    Can take intramuscular.  . Shellfish Allergy Swelling  . Shellfish-Derived Products Swelling  . Sulfa Antibiotics Rash  . Sulfasalazine Rash  . Tetracycline Rash    rash  . Tetracyclines & Related Rash    rash   . Macrobid [Nitrofurantoin Monohyd Macro] Swelling  . Tape Other (See Comments)    Causes redness and when removed after a long period of time tears skin. Pads for leads cause itching  . Latex Rash  . Other Other (See Comments)    Avoids milk alone, can tolerate other milk ingredients. MM, RD 10/24/18  Vitamins given with TPN infusion  . Sulfamethoxazole-Trimethoprim Rash    Can't take by iv or by mouth

## 2020-07-28 ENCOUNTER — Encounter: Payer: Self-pay | Admitting: Osteopathic Medicine

## 2020-08-26 DIAGNOSIS — R1013 Epigastric pain: Secondary | ICD-10-CM | POA: Diagnosis not present

## 2020-08-26 DIAGNOSIS — K838 Other specified diseases of biliary tract: Secondary | ICD-10-CM | POA: Diagnosis not present

## 2020-08-26 DIAGNOSIS — R1310 Dysphagia, unspecified: Secondary | ICD-10-CM | POA: Diagnosis not present

## 2020-08-26 DIAGNOSIS — Z9884 Bariatric surgery status: Secondary | ICD-10-CM | POA: Diagnosis not present

## 2020-09-03 DIAGNOSIS — Z9884 Bariatric surgery status: Secondary | ICD-10-CM | POA: Diagnosis not present

## 2020-09-03 DIAGNOSIS — R1084 Generalized abdominal pain: Secondary | ICD-10-CM | POA: Diagnosis not present

## 2020-09-03 DIAGNOSIS — K5900 Constipation, unspecified: Secondary | ICD-10-CM | POA: Diagnosis not present

## 2020-09-03 DIAGNOSIS — K660 Peritoneal adhesions (postprocedural) (postinfection): Secondary | ICD-10-CM | POA: Diagnosis not present

## 2020-09-03 DIAGNOSIS — E860 Dehydration: Secondary | ICD-10-CM | POA: Diagnosis not present

## 2020-09-03 DIAGNOSIS — K56609 Unspecified intestinal obstruction, unspecified as to partial versus complete obstruction: Secondary | ICD-10-CM | POA: Diagnosis not present

## 2020-09-03 DIAGNOSIS — R079 Chest pain, unspecified: Secondary | ICD-10-CM | POA: Diagnosis not present

## 2020-09-04 DIAGNOSIS — R079 Chest pain, unspecified: Secondary | ICD-10-CM | POA: Diagnosis not present

## 2020-09-04 DIAGNOSIS — R1084 Generalized abdominal pain: Secondary | ICD-10-CM | POA: Diagnosis not present

## 2020-09-04 DIAGNOSIS — R109 Unspecified abdominal pain: Secondary | ICD-10-CM | POA: Diagnosis not present

## 2020-09-04 DIAGNOSIS — K660 Peritoneal adhesions (postprocedural) (postinfection): Secondary | ICD-10-CM | POA: Diagnosis not present

## 2020-09-04 DIAGNOSIS — K5651 Intestinal adhesions [bands], with partial obstruction: Secondary | ICD-10-CM | POA: Diagnosis not present

## 2020-09-04 DIAGNOSIS — K56609 Unspecified intestinal obstruction, unspecified as to partial versus complete obstruction: Secondary | ICD-10-CM | POA: Diagnosis not present

## 2020-09-04 DIAGNOSIS — E860 Dehydration: Secondary | ICD-10-CM | POA: Diagnosis not present

## 2020-09-04 DIAGNOSIS — Z9884 Bariatric surgery status: Secondary | ICD-10-CM | POA: Diagnosis not present

## 2020-09-05 DIAGNOSIS — E860 Dehydration: Secondary | ICD-10-CM | POA: Diagnosis not present

## 2020-09-05 DIAGNOSIS — K56609 Unspecified intestinal obstruction, unspecified as to partial versus complete obstruction: Secondary | ICD-10-CM | POA: Diagnosis not present

## 2020-09-05 DIAGNOSIS — K660 Peritoneal adhesions (postprocedural) (postinfection): Secondary | ICD-10-CM | POA: Diagnosis not present

## 2020-09-05 DIAGNOSIS — R079 Chest pain, unspecified: Secondary | ICD-10-CM | POA: Diagnosis not present

## 2020-09-05 DIAGNOSIS — Z9884 Bariatric surgery status: Secondary | ICD-10-CM | POA: Diagnosis not present

## 2020-09-09 DIAGNOSIS — K439 Ventral hernia without obstruction or gangrene: Secondary | ICD-10-CM | POA: Diagnosis not present

## 2020-09-09 DIAGNOSIS — R1010 Upper abdominal pain, unspecified: Secondary | ICD-10-CM | POA: Diagnosis not present

## 2020-09-09 DIAGNOSIS — K224 Dyskinesia of esophagus: Secondary | ICD-10-CM | POA: Diagnosis not present

## 2020-09-09 DIAGNOSIS — R1319 Other dysphagia: Secondary | ICD-10-CM | POA: Diagnosis not present

## 2020-09-12 DIAGNOSIS — Z9884 Bariatric surgery status: Secondary | ICD-10-CM | POA: Diagnosis not present

## 2020-09-12 DIAGNOSIS — E559 Vitamin D deficiency, unspecified: Secondary | ICD-10-CM | POA: Diagnosis not present

## 2020-09-12 DIAGNOSIS — K449 Diaphragmatic hernia without obstruction or gangrene: Secondary | ICD-10-CM | POA: Diagnosis not present

## 2020-09-12 DIAGNOSIS — K224 Dyskinesia of esophagus: Secondary | ICD-10-CM | POA: Diagnosis not present

## 2020-09-12 DIAGNOSIS — R1013 Epigastric pain: Secondary | ICD-10-CM | POA: Diagnosis not present

## 2020-09-18 DIAGNOSIS — R1310 Dysphagia, unspecified: Secondary | ICD-10-CM | POA: Diagnosis not present

## 2020-10-14 DIAGNOSIS — Z20822 Contact with and (suspected) exposure to covid-19: Secondary | ICD-10-CM | POA: Diagnosis not present

## 2020-11-11 ENCOUNTER — Encounter: Payer: Self-pay | Admitting: Osteopathic Medicine

## 2020-11-11 ENCOUNTER — Telehealth: Payer: Self-pay | Admitting: Osteopathic Medicine

## 2020-11-11 ENCOUNTER — Other Ambulatory Visit: Payer: Self-pay

## 2020-11-11 ENCOUNTER — Ambulatory Visit: Payer: BC Managed Care – PPO | Admitting: Osteopathic Medicine

## 2020-11-11 VITALS — BP 115/69 | HR 89 | Temp 97.7°F | Wt 152.1 lb

## 2020-11-11 DIAGNOSIS — G8929 Other chronic pain: Secondary | ICD-10-CM

## 2020-11-11 DIAGNOSIS — Z9884 Bariatric surgery status: Secondary | ICD-10-CM

## 2020-11-11 DIAGNOSIS — M5441 Lumbago with sciatica, right side: Secondary | ICD-10-CM

## 2020-11-11 DIAGNOSIS — G43811 Other migraine, intractable, with status migrainosus: Secondary | ICD-10-CM

## 2020-11-11 DIAGNOSIS — E785 Hyperlipidemia, unspecified: Secondary | ICD-10-CM

## 2020-11-11 DIAGNOSIS — L989 Disorder of the skin and subcutaneous tissue, unspecified: Secondary | ICD-10-CM

## 2020-11-11 DIAGNOSIS — M5442 Lumbago with sciatica, left side: Secondary | ICD-10-CM

## 2020-11-11 DIAGNOSIS — M797 Fibromyalgia: Secondary | ICD-10-CM

## 2020-11-11 MED ORDER — TRIAMCINOLONE ACETONIDE 0.1 % EX OINT: 1.0000 "application " | TOPICAL_OINTMENT | Freq: Three times a day (TID) | CUTANEOUS | 1 refills | Status: DC

## 2020-11-11 MED ORDER — METHOCARBAMOL 500 MG PO TABS: 500.0000 mg | ORAL_TABLET | Freq: Three times a day (TID) | ORAL | 0 refills | Status: DC | PRN

## 2020-11-11 MED ORDER — TRAZODONE HCL 100 MG PO TABS: 200.0000 mg | ORAL_TABLET | Freq: Every evening | ORAL | 3 refills | Status: DC | PRN

## 2020-11-11 NOTE — Telephone Encounter (Signed)
error 

## 2020-11-11 NOTE — Progress Notes (Signed)
Rachel Rich is a 50 y.o. female who presents to  [place] at [facility]  today, 11/11/20, seeking care for the following:  . Following up fibromyalgia.  Arthritis is gotten a bit worse, she would like to get reestablished with neurosurgery to revisit repeating radioablation.  She is also noted more stiffness/soreness particularly at night and on particularly bad nights it is affecting sleep which she feels is causing other problems of course. . Concern for small nodule on right forearm and in central back, have been there over a year, itching.  Has tried OTC hydrocortisone without much benefit.  Previous fibromyalgia treatments include...  TCA: Amitriptyline none on file, Trazodone was taking 100 mg qhs which we gave option to increase to 150 mg last visit 07/23/20  SSRI/SNRI: Duloxetine No , currently on aripiprazole 5-10 mg daily  Muscle Relaxer: Cyclobenzaprine last Rx on file 2017 - wasn't needing it especially after weight loss, we refilled this last visit 07/23/20  Anticonvulsants: Gabapentin No , Pregabalin previously in 2017 -   NSAID not taking d/t gastric bpass  physical activity less since working at home (started working form home last year)   Other: tramadol 50-100 mg q8h prn, 21 tablets Rx 05/22/20 by sports medicine d/t whiplash injury. Tramadol ok to take but avoid w/ higher dose trazodone (concern for serotonin syndrome)    ASSESSMENT & PLAN with other pertinent findings:  The primary encounter diagnosis was Fibromyalgia. Diagnoses of Skin lesion, Hyperlipidemia, unspecified hyperlipidemia type, Other migraine with status migrainosus, intractable, and History of bariatric surgery were also pertinent to this visit.   1. Fibromyalgia Hold cyclobenzaprine for now, will trial methocarbamol Unable to do NSAID due to history of bariatric surgery.  Patient has tried Voltaren and this caused a rash  2. Skin lesion Appears to  be possible dyshidrotic eczema variant based on appearance and description.  Will trial higher potency steroids with occlusive dressing over it for the next couple of days and hopefully this will get it to resolve.  No red flags for malignancy  3. Hyperlipidemia, unspecified hyperlipidemia type 4. Other migraine with status migrainosus, intractable 5. History of bariatric surgery Patient states her bariatric surgery team is frequently checking labs for nutrient deficiencies, she thinks it has been a while since she was checked for cholesterol, we will go ahead and order this at patient request  6. Back pain  Referral to neurosurgery       There are no Patient Instructions on file for this visit.  Orders Placed This Encounter  Procedures  . CMP14+EGFR  . Hemoglobin A1c  . Lipid panel    Meds ordered this encounter  Medications  . methocarbamol (ROBAXIN) 500 MG tablet    Sig: Take 1 tablet (500 mg total) by mouth every 8 (eight) hours as needed for muscle spasms.    Dispense:  90 tablet    Refill:  0  . traZODone (DESYREL) 100 MG tablet    Sig: Take 2 tablets (200 mg total) by mouth at bedtime as needed for sleep.    Dispense:  180 tablet    Refill:  3  . triamcinolone ointment (KENALOG) 0.1 %    Sig: Apply 1 application topically 3 (three) times daily.    Dispense:  15 g    Refill:  1     See below for relevant physical exam findings  See below for recent lab and imaging results reviewed  Medications, allergies, PMH, PSH, SocH, [place]  reviewed below    Follow-up instructions: No follow-ups on file.                                        Exam:  BP 115/69 (BP Location: Left Arm, Patient Position: Sitting, Cuff Size: Normal)   Pulse 89   Temp 97.7 F (36.5 C) (Oral)   Wt 152 lb 1.3 oz (69 kg)   BMI 24.55 kg/m   Constitutional: VS see above. General Appearance: alert, well-developed, well-nourished, NAD  Neck: No masses,  trachea midline.   Respiratory: Normal respiratory effort. no wheeze, no rhonchi, no rales  Cardiovascular: S1/S2 normal, no murmur, no rub/gallop auscultated. RRR.   Musculoskeletal: Gait normal. Symmetric and independent movement of all extremities  Neurological: Normal balance/coordination. No tremor.  Skin: warm, dry, intact. See above  Psychiatric: Normal judgment/insight. Normal mood and affect. Oriented x3.   Current Meds  Medication Sig  . acetaminophen (TYLENOL) 650 MG CR tablet Take 1 tablet (650 mg total) by mouth every 8 (eight) hours as needed for pain.  . ARIPiprazole (ABILIFY) 10 MG tablet Take 1 tablet (10 mg total) by mouth daily.  . famotidine (PEPCID) 20 MG tablet Take 20 mg by mouth 2 (two) times daily.  . frovatriptan (FROVA) 2.5 MG tablet TAKE 1 TAB BY MOUTH AS NEEDED FOR MIGRAINE. IF RECURS, MAY REPEAT AFTER 2 HOURS.MAX 3 TABS/24 HOURS.  . methocarbamol (ROBAXIN) 500 MG tablet Take 1 tablet (500 mg total) by mouth every 8 (eight) hours as needed for muscle spasms.  . SUMAtriptan (IMITREX) 25 MG tablet Take 1-2 tablets (25-50 mg total) by mouth every 2 (two) hours as needed for migraine or headache. May repeat in 2 hours if headache persists. Max daily dose 100 mg  . traMADol (ULTRAM) 50 MG tablet Take 1 tablet (50 mg total) by mouth every 8 (eight) hours as needed for moderate pain. 1-2 tabs by mouth Q8 hours, maximum 6 tabs per day.  . triamcinolone ointment (KENALOG) 0.1 % Apply 1 application topically 3 (three) times daily.    Allergies  Allergen Reactions  . Codeine Rash, Shortness Of Breath and Swelling  . Elemental Sulfur Anaphylaxis  . Erythromycin Hives, Rash and Anaphylaxis  . Glucosamine Swelling  . Hydrocodone-Acetaminophen Swelling and Anaphylaxis  . Iodinated Diagnostic Agents Anaphylaxis and Rash    Rash and vomiting    . Metrizamide Anaphylaxis    Rash and vomiting  . Morphine Shortness Of Breath, Swelling and Hives  . Morphine And Related  Swelling, Rash and Shortness Of Breath  . Nitrofurantoin Swelling  . Prednisone Itching, Rash and Anaphylaxis  . Shellfish Allergy Swelling and Anaphylaxis  . Topiramate Swelling, Itching and Hives    High dose caused vision problems but low dose tolerates fine  High dose caused vision problems but low dose tolerates fine   . Tramadol Hives  . Wound Dressing Adhesive Other (See Comments)    Causes redness and when removed after a long period of time tears skin. Pads for leads cause itching DERMABOND - BLISTERS  . Cortisone Rash and Other (See Comments)    Anaphylaxis (outside records) however tolerated prednisone. Other reaction(s): Redness  Redness  . Demeclocycline Rash    rash   . Hydrocodone Swelling  . Meperidine Other (See Comments), Itching and Rash    Can take intramuscular.  . Shellfish-Derived Products Swelling  . Sulfa Antibiotics Rash and Swelling  .  Sulfasalazine Rash and Swelling  . Tetracycline Rash    rash  . Tetracyclines & Related Rash    rash   . Macrobid [Nitrofurantoin Monohyd Macro] Swelling  . Tape Other (See Comments)    Causes redness and when removed after a long period of time tears skin. Pads for leads cause itching  . Latex Rash  . Other Other (See Comments)    Avoids milk alone, can tolerate other milk ingredients. MM, RD 10/24/18  Vitamins given with TPN infusion  . Sulfamethoxazole-Trimethoprim Rash    Can't take by iv or by mouth    Patient Active Problem List   Diagnosis Date Noted  . Injury of neck, whiplash 05/22/2020  . Primary osteoarthritis of both knees 03/16/2019  . Exposure to COVID-19 virus 02/16/2019  . Conjunctivitis 12/28/2017  . Family history of breast cancer 04/13/2016  . Abdominal pain, chronic, right lower quadrant 04/13/2016  . History of migraine 04/13/2016  . History of non anemic vitamin B12 deficiency 04/13/2016  . Postmenopausal 04/13/2016  . Abnormal weight gain 01/16/2015  . Menopause 07/25/2014  .  Asthma, mild persistent 07/25/2014  . HSV infection 07/25/2014  . Bipolar disorder ([place]) 07/25/2014  . Fibroadenoma of left breast 07/25/2014  . Cystadenoma of left ovary 07/25/2014  . Rosacea 07/25/2014  . Insomnia 03/26/2014  . Environmental allergies 03/26/2014  . Fibromyalgia 03/26/2014  . RLS (restless legs syndrome) 03/26/2014  . Other migraine with status migrainosus, not intractable 03/26/2014  . Nonalcoholic hepatosteatosis 76/16/0737  . Hyperlipidemia 03/29/2010    Family History  Problem Relation Age of Onset  . Alcoholism Father   . Heart attack Father   . Hyperlipidemia Father   . Cancer Mother   . Depression Mother   . Stroke Mother   . Cancer Sister     Social History   Tobacco Use  Smoking Status Never Smoker  Smokeless Tobacco Never Used    Past Surgical History:  Procedure Laterality Date  . ABDOMINAL HYSTERECTOMY  2009   masses on ovaries, endometriosis, no cancer diagnosis  . APPENDECTOMY  1979  . BARIATRIC SURGERY     done in [place]  . BREAST LUMPECTOMY Left 2010   pre-cancerous   . CESAREAN SECTION  2001,2007   x2   . GALLBLADDER SURGERY  2000  . HERNIA REPAIR  2009   ventral from c/s  . KNEE SURGERY Right    multiple procedures - arthroscopic   . TONSILLECTOMY  1976    Immunization History  Administered Date(s) Administered  . Influenza Split 05/07/2013  . Influenza,inj,Quad PF,6+ Mos 03/31/2017, 06/06/2018, 03/18/2019, 06/05/2020  . Influenza-Unspecified 04/26/2015, 04/04/2016, 03/30/2017, 04/05/2018  . Moderna Sars-Covid-2 Vaccination 10/18/2019, 11/18/2019, 05/27/2020  . Pneumococcal Polysaccharide-23 04/13/2016  . Tdap 04/03/2008, 06/06/2018  . Zoster Recombinat (Shingrix) 06/05/2020    No results found for this or any previous visit (from the past 2160 hour(s)).  No results found.     All questions at time of visit were answered - patient instructed to contact office with any additional concerns or updates. ER/RTC  precautions were reviewed with the patient as applicable.   Please note: manual typing as well as voice recognition software may have been used to produce this document - typos may escape review. Please contact Dr. Sheppard Coil for any needed clarifications.

## 2020-12-22 ENCOUNTER — Other Ambulatory Visit: Payer: Self-pay | Admitting: Osteopathic Medicine

## 2020-12-25 DIAGNOSIS — K219 Gastro-esophageal reflux disease without esophagitis: Secondary | ICD-10-CM | POA: Diagnosis not present

## 2020-12-25 DIAGNOSIS — T183XXA Foreign body in small intestine, initial encounter: Secondary | ICD-10-CM | POA: Diagnosis not present

## 2020-12-25 DIAGNOSIS — R0789 Other chest pain: Secondary | ICD-10-CM | POA: Diagnosis not present

## 2020-12-25 DIAGNOSIS — R1319 Other dysphagia: Secondary | ICD-10-CM | POA: Diagnosis not present

## 2020-12-25 DIAGNOSIS — K224 Dyskinesia of esophagus: Secondary | ICD-10-CM | POA: Diagnosis not present

## 2020-12-25 DIAGNOSIS — K449 Diaphragmatic hernia without obstruction or gangrene: Secondary | ICD-10-CM | POA: Diagnosis not present

## 2020-12-25 DIAGNOSIS — R112 Nausea with vomiting, unspecified: Secondary | ICD-10-CM | POA: Diagnosis not present

## 2020-12-25 DIAGNOSIS — Z9884 Bariatric surgery status: Secondary | ICD-10-CM | POA: Diagnosis not present

## 2020-12-25 DIAGNOSIS — R1310 Dysphagia, unspecified: Secondary | ICD-10-CM | POA: Diagnosis not present

## 2020-12-26 DIAGNOSIS — R0789 Other chest pain: Secondary | ICD-10-CM | POA: Diagnosis not present

## 2020-12-26 DIAGNOSIS — K219 Gastro-esophageal reflux disease without esophagitis: Secondary | ICD-10-CM | POA: Diagnosis not present

## 2021-02-05 DIAGNOSIS — J069 Acute upper respiratory infection, unspecified: Secondary | ICD-10-CM | POA: Diagnosis not present

## 2021-02-10 ENCOUNTER — Ambulatory Visit: Payer: BC Managed Care – PPO | Admitting: Physician Assistant

## 2021-02-10 DIAGNOSIS — Z20822 Contact with and (suspected) exposure to covid-19: Secondary | ICD-10-CM | POA: Diagnosis not present

## 2021-02-11 DIAGNOSIS — Z9884 Bariatric surgery status: Secondary | ICD-10-CM | POA: Diagnosis not present

## 2021-02-11 DIAGNOSIS — R112 Nausea with vomiting, unspecified: Secondary | ICD-10-CM | POA: Diagnosis not present

## 2021-02-12 ENCOUNTER — Encounter: Payer: Self-pay | Admitting: Physician Assistant

## 2021-02-12 ENCOUNTER — Other Ambulatory Visit: Payer: Self-pay

## 2021-02-12 ENCOUNTER — Ambulatory Visit (INDEPENDENT_AMBULATORY_CARE_PROVIDER_SITE_OTHER): Payer: BC Managed Care – PPO | Admitting: Physician Assistant

## 2021-02-12 VITALS — BP 99/66 | HR 73 | Ht 66.0 in | Wt 151.0 lb

## 2021-02-12 DIAGNOSIS — H5360 Unspecified night blindness: Secondary | ICD-10-CM

## 2021-02-12 DIAGNOSIS — Z9884 Bariatric surgery status: Secondary | ICD-10-CM

## 2021-02-12 DIAGNOSIS — E538 Deficiency of other specified B group vitamins: Secondary | ICD-10-CM | POA: Diagnosis not present

## 2021-02-12 DIAGNOSIS — R202 Paresthesia of skin: Secondary | ICD-10-CM

## 2021-02-12 DIAGNOSIS — L819 Disorder of pigmentation, unspecified: Secondary | ICD-10-CM | POA: Diagnosis not present

## 2021-02-12 DIAGNOSIS — R5383 Other fatigue: Secondary | ICD-10-CM

## 2021-02-12 DIAGNOSIS — M791 Myalgia, unspecified site: Secondary | ICD-10-CM | POA: Diagnosis not present

## 2021-02-12 DIAGNOSIS — E559 Vitamin D deficiency, unspecified: Secondary | ICD-10-CM

## 2021-02-12 DIAGNOSIS — E611 Iron deficiency: Secondary | ICD-10-CM

## 2021-02-12 DIAGNOSIS — R5381 Other malaise: Secondary | ICD-10-CM

## 2021-02-12 DIAGNOSIS — L989 Disorder of the skin and subcutaneous tissue, unspecified: Secondary | ICD-10-CM

## 2021-02-12 MED ORDER — KETOCONAZOLE 2 % EX CREA: 1.0000 "application " | TOPICAL_CREAM | Freq: Two times a day (BID) | CUTANEOUS | 0 refills | Status: AC

## 2021-02-12 NOTE — Progress Notes (Signed)
Acute Office Visit  Subjective:    Patient ID: Rachel Rich, female    DOB: 1971/03/23, 50 y.o.   MRN: [id number]  Chief Complaint  Patient presents with   Fatigue    HPI Patient is in today for "fibromyalgia flare-up" and two skin lesions  Skin lesions First lesion is on right forearm - has been present for several years but is changing It has developed irregular borders, rough texture and occasionally itches No personal hx of skin cancer - has not had a skin check in years and reports not wearing sunscreen regularly Family hx of skin cancer including melanoma in sister  Second skin lesion is on left anterior shin Has been present for about 2 weeks Reports she felt something bite her leg while driving Then developed a red lesion  It is not tender, pruritic or symptomatic at all She has applied topical steroids but it does not change Denies hx of tick bite or spending much time outdoors  2. Fibormyalgia flare-up She reports for the last 4 months she has had severe, debilitating fatigue and diffuse muscle aches. Fatigue is severe, sleeping 12-16 hrs and not feeling rested. Missing more work. Symptoms are similar to past fibro flare-up in 2018. She is on TCA and muscle relaxer currently. NSAIDs are contraindicated due to gastric bypass status.   Current psychotropic medications include Abilify 10 mg and Trazodone 100 mg.  According to PCP: Previous fibromyalgia treatments include... TCA: Amitriptyline none on file, Trazodone was taking 100 mg qhs which we gave option to increase to 150 mg last visit 07/23/20 SSRI/SNRI: Duloxetine No , currently on aripiprazole 5-10 mg daily Muscle Relaxer: Cyclobenzaprine last Rx on file 2017 - wasn't needing it especially after weight loss, we refilled this last visit 07/23/20 Anticonvulsants: Gabapentin No , Pregabalin previously in 2017 - NSAID not taking d/t gastric bpass physical activity less since working at home (started working  form home last year) Other: tramadol 50-100 mg q8h prn, 21 tablets Rx 05/22/20 by sports medicine d/t whiplash injury. Tramadol ok to take but avoid w/ higher dose trazodone (concern for serotonin syndrome)    She reports she had a duodenal switch surgery in 2019 - had to be on TPN due to severe weight loss and was 88 lb with dumping syndrome. 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 She still follows with Insurance claims handler and nutritionists. Admits she is not taking vitamins, but knows she should.    Past Medical History:  Diagnosis Date   Asthma, mild persistent 07/25/2014   Symbicort 160-4.5 outside records    Fibromyalgia 03/26/2014   Tramadol on outside records.    Hyperlipidemia    Menopause 07/25/2014   Estradiol 32m QD on outside records.    Other migraine with status migrainosus, not intractable 03/26/2014   RLS (restless legs syndrome) 03/26/2014   Rosacea 07/25/2014    Past Surgical History:  Procedure Laterality Date   ABDOMINAL HYSTERECTOMY  2009   masses on ovaries, endometriosis, no cancer diagnosis   APPENDECTOMY  1979   [place]  BREAST LUMPECTOMY Left 2010   pre-cancerous    CESAREAN SECTION  2001,2007   x2    GALLBLADDER SURGERY  2000   HERNIA REPAIR  2009   ventral from c/s   KNEE SURGERY Right    multiple procedures - arthroscopic    TONSILLECTOMY  1976    Family History  Problem Relation Age of Onset   Alcoholism Father  Heart attack Father    Hyperlipidemia Father    Cancer Mother    Depression Mother    Stroke Mother    Cancer Sister     Social History   Socioeconomic History   Marital status: Married    Spouse name: Not on file   Number of children: Not on file   Years of education: Not on file   Highest education level: Not on file  Occupational History   Not on file  Tobacco Use   Smoking status: Never   Smokeless tobacco: Never  Substance and Sexual Activity   Alcohol use: No   Drug use: Yes   Sexual  activity: Yes    Partners: Male    Birth control/protection: Other-see comments    Comment: s/p hysterectomy   Other Topics Concern   Not on file  Social History Narrative   Not on file   Social Determinants of Health   Financial Resource Strain: Not on file  Food Insecurity: Not on file  Transportation Needs: Not on file  Physical Activity: Not on file  Stress: Not on file  Social Connections: Not on file  Intimate Partner Violence: Not on file    Outpatient Medications Prior to Visit  Medication Sig Dispense Refill   acetaminophen (TYLENOL) 650 MG CR tablet Take 1 tablet (650 mg total) by mouth every 8 (eight) hours as needed for pain. 90 tablet 3   ARIPiprazole (ABILIFY) 10 MG tablet Take 1 tablet (10 mg total) by mouth daily. 90 tablet 3   famotidine (PEPCID) 20 MG tablet Take 20 mg by mouth 2 (two) times daily.     frovatriptan (FROVA) 2.5 MG tablet TAKE 1 TAB BY MOUTH AS NEEDED FOR MIGRAINE. IF RECURS, MAY REPEAT AFTER 2 HOURS.MAX 3 TABS/24 HOURS. 10 tablet 0   methocarbamol (ROBAXIN) 500 MG tablet TAKE 1 TABLET BY MOUTH EVERY 8 HOURS AS NEEDED FOR MUSCLE SPASMS. 90 tablet 0   SUMAtriptan (IMITREX) 25 MG tablet Take 1-2 tablets (25-50 mg total) by mouth every 2 (two) hours as needed for migraine or headache. May repeat in 2 hours if headache persists. Max daily dose 100 mg 10 tablet 4   traMADol (ULTRAM) 50 MG tablet Take 1 tablet (50 mg total) by mouth every 8 (eight) hours as needed for moderate pain. 1-2 tabs by mouth Q8 hours, maximum 6 tabs per day. 5 tablet 0   traZODone (DESYREL) 100 MG tablet Take 2 tablets (200 mg total) by mouth at bedtime as needed for sleep. 180 tablet 3   triamcinolone ointment (KENALOG) 0.1 % Apply 1 application topically 3 (three) times daily. 15 g 1   cyclobenzaprine (FLEXERIL) 10 MG tablet Take 0.5-1 tablets (5-10 mg total) by mouth 3 (three) times daily as needed for muscle spasms. Caution: can cause drowsiness (Patient not taking: Reported on  11/11/2020) 60 tablet 1   loperamide (IMODIUM A-D) 2 MG tablet Take 1-2 tablets by mouth 2 (two) times daily. (Patient not taking: Reported on 11/11/2020)     oxybutynin (DITROPAN-XL) 10 MG 24 hr tablet Take 1 tablet (10 mg total) by mouth daily. (Patient not taking: Reported on 11/11/2020) 90 tablet 3   sucralfate (CARAFATE) 1 g tablet Take 1 g by mouth 4 (four) times daily. (Patient not taking: Reported on 11/11/2020)     traMADol (ULTRAM) 50 MG tablet Take 1-2 tablets (50-100 mg total) by mouth every 8 (eight) hours as needed for moderate pain. Maximum 6 tabs per day. (Patient not taking:  Reported on 11/11/2020) 21 tablet 0   No facility-administered medications prior to visit.    Allergies  Allergen Reactions   Codeine Rash, Shortness Of Breath and Swelling   Elemental Sulfur Anaphylaxis   Erythromycin Hives, Rash and Anaphylaxis   Glucosamine Swelling   Hydrocodone-Acetaminophen Swelling and Anaphylaxis   Iodinated Diagnostic Agents Anaphylaxis and Rash    Rash and vomiting     Metrizamide Anaphylaxis    Rash and vomiting   Morphine Shortness Of Breath, Swelling and Hives   Morphine And Related Swelling, Rash and Shortness Of Breath   Nitrofurantoin Swelling   Prednisone Itching, Rash and Anaphylaxis   Shellfish Allergy Swelling and Anaphylaxis   Topiramate Swelling, Itching and Hives    High dose caused vision problems but low dose tolerates fine  High dose caused vision problems but low dose tolerates fine    Tramadol Hives   Wound Dressing Adhesive Other (See Comments)    Causes redness and when removed after a long period of time tears skin. Pads for leads cause itching DERMABOND - BLISTERS   Cortisone Rash and Other (See Comments)    Anaphylaxis (outside records) however tolerated prednisone. Other reaction(s): Redness  Redness   Demeclocycline Rash    rash    Hydrocodone Swelling   Meperidine Other (See Comments), Itching and Rash    Can take intramuscular.    Shellfish-Derived Products Swelling   Sulfa Antibiotics Rash and Swelling   Sulfasalazine Rash and Swelling   Tetracycline Rash    rash   Tetracyclines & Related Rash    rash    Macrobid [Nitrofurantoin Monohyd Macro] Swelling   Tape Other (See Comments)    Causes redness and when removed after a long period of time tears skin. Pads for leads cause itching   Latex Rash   Other Other (See Comments)    Avoids milk alone, can tolerate other milk ingredients. MM, RD 10/24/18  Vitamins given with TPN infusion   Sulfamethoxazole-Trimethoprim Rash    Can't take by iv or by mouth    Review of Systems  Constitutional:  Positive for fatigue.  Eyes:  Positive for visual disturbance (problems with nightvision and blurred vision).       + vision change   Musculoskeletal:  Positive for myalgias.  Neurological:  Positive for weakness and numbness (fingers and toes).  All other systems reviewed and are negative.     Objective:    Physical Exam Vitals reviewed.  Constitutional:      Appearance: Normal appearance. She is not ill-appearing.  Pulmonary:     Effort: Pulmonary effort is normal.  Skin:    General: Skin is warm and dry.     Findings: Lesion present.       Neurological:     Mental Status: She is alert.     Gait: Gait is intact.  Psychiatric:        Behavior: Behavior normal.        Thought Content: Thought content normal.        Judgment: Judgment normal.       BP 99/66   Pulse 73   Ht 5' 6"  (1.676 m)   Wt 151 lb (68.5 kg)   SpO2 100%   BMI 24.37 kg/m  Wt Readings from Last 3 Encounters:  02/12/21 151 lb (68.5 kg)  11/11/20 152 lb 1.3 oz (69 kg)  07/23/20 143 lb (64.9 kg)    Health Maintenance Due  Topic Date Due   HIV Screening  Never done   COLONOSCOPY (Pts 45-9yr Insurance coverage will need to be confirmed)  Never done   MAMMOGRAM  02/22/2019   Zoster Vaccines- Shingrix (2 of 2) 07/31/2020   COVID-19 Vaccine (4 - Booster for Moderna series)  08/27/2020    There are no preventive care reminders to display for this patient.   Lab Results  Component Value Date   TSH 1.32 07/27/2017   Lab Results  Component Value Date   WBC 6.0 07/27/2017   HGB 12.6 07/27/2017   HCT 39.0 07/27/2017   MCV 87.4 07/27/2017   PLT 208 07/27/2017   Lab Results  Component Value Date   NA 140 02/16/2019   K 4.1 02/16/2019   CO2 32 02/16/2019   GLUCOSE 86 02/16/2019   BUN 19 02/16/2019   CREATININE 0.83 02/16/2019   BILITOT 1.6 (H) 07/27/2017   ALKPHOS 76 03/03/2017   AST 96 (H) 07/27/2017   ALT 116 (H) 07/27/2017   PROT 6.2 07/27/2017   ALBUMIN 4.4 03/03/2017   CALCIUM 9.2 02/16/2019   Lab Results  Component Value Date   CHOL 192 02/16/2019   Lab Results  Component Value Date   HDL 55 02/16/2019   Lab Results  Component Value Date   LDLCALC 119 (H) 02/16/2019   Lab Results  Component Value Date   TRIG 79 02/16/2019   Lab Results  Component Value Date   CHOLHDL 3.5 02/16/2019   No results found for: HGBA1C     Assessment & Plan:   Problem List Items Addressed This Visit   None Visit Diagnoses     Skin lesion of left lower extremity    -  Primary   Relevant Medications   ketoconazole (NIZORAL) 2 % cream   Other Relevant Orders   Ambulatory referral to Dermatology   Pigmented skin lesion suspicious for malignant neoplasm       Relevant Orders   Ambulatory referral to Dermatology   Status post bariatric surgery       Relevant Orders   Ferritin   Vitamin D (25 hydroxy)   B12   CBC with Differential/Platelet   Vitamin B1   Vitamin B2(Riboflavin),Plasma   Vitamin B6   Vitamin A   Vitamin E   COMPLETE METABOLIC PANEL WITH GFR   Myalgia       Relevant Orders   Ferritin   Vitamin D (25 hydroxy)   B12   CBC with Differential/Platelet   Vitamin B1   Vitamin B2(Riboflavin),Plasma   Vitamin B6   Vitamin A   Vitamin E   COMPLETE METABOLIC PANEL WITH GFR   Paresthesias       Relevant Orders   Ferritin    Vitamin D (25 hydroxy)   B12   CBC with Differential/Platelet   Vitamin B1   Vitamin B2(Riboflavin),Plasma   Vitamin B6   Vitamin A   Vitamin E   COMPLETE METABOLIC PANEL WITH GFR   Night vision loss       Relevant Orders   Ferritin   Vitamin D (25 hydroxy)   B12   CBC with Differential/Platelet   Vitamin B1   Vitamin B2(Riboflavin),Plasma   Vitamin B6   Vitamin A   Vitamin E   COMPLETE METABOLIC PANEL WITH GFR   Malaise and fatigue       Relevant Orders   Ferritin   Vitamin D (25 hydroxy)   B12   CBC with Differential/Platelet   Vitamin B1   Vitamin B2(Riboflavin),Plasma   Vitamin  B6   Vitamin A   Vitamin E   COMPLETE METABOLIC PANEL WITH GFR   Vitamin D deficiency       Relevant Orders   Ferritin   Vitamin D (25 hydroxy)   B12   CBC with Differential/Platelet   Vitamin B1   Vitamin B2(Riboflavin),Plasma   Vitamin B6   Vitamin A   Vitamin E   COMPLETE METABOLIC PANEL WITH GFR   Dietary B12 deficiency       Relevant Orders   Ferritin   Vitamin D (25 hydroxy)   B12   CBC with Differential/Platelet   Vitamin B1   Vitamin B2(Riboflavin),Plasma   Vitamin B6   Vitamin A   Vitamin E   COMPLETE METABOLIC PANEL WITH GFR   Iron deficiency       Relevant Orders   Ferritin   Vitamin D (25 hydroxy)   B12   CBC with Differential/Platelet   Vitamin B1   Vitamin B2(Riboflavin),Plasma   Vitamin B6   Vitamin A   Vitamin E   COMPLETE METABOLIC PANEL WITH GFR      Severe fatigue I am concerned about micronutrient deficiency given her Roux-enY surgery, hx of malabsorption and dumping and that she is not taking any vitamins  Labs ordered as above Personally reviewed labs dated 11/11/20 and 08/1020 showing iron def, vit d insufficiency, low normal b12 and mildly increased alk phos - will monitor theses Advised to schedule follow-up with her outpatient nutritionist to ensure she is getting macro-and micronutrients She will follow-up with PCP to discuss  medication management of her myalgias, but I do think nutrient deficiency is contributory and should be corrected before considering other pharmacologic agents  2. Skin lesion of right forearm Ddx includes AK, SCC, BCC, and less likely melanoma Referral to Derm for appropriate biopsy  3. Skin lesion of left lower leg Apply Ketoconazole bid x 14 days Follow-up with Derm  Meds ordered this encounter  Medications   ketoconazole (NIZORAL) 2 % cream    Sig: Apply 1 application topically 2 (two) times daily for 14 days. To affected area    Dispense:  15 g    Refill:  0    Order Specific Question:   Supervising Provider    Answer:   Emeterio Reeve [[id number]]     Trixie Dredge, PA-C

## 2021-02-17 DIAGNOSIS — L814 Other melanin hyperpigmentation: Secondary | ICD-10-CM | POA: Diagnosis not present

## 2021-02-17 DIAGNOSIS — D229 Melanocytic nevi, unspecified: Secondary | ICD-10-CM | POA: Diagnosis not present

## 2021-02-17 DIAGNOSIS — D2261 Melanocytic nevi of right upper limb, including shoulder: Secondary | ICD-10-CM | POA: Diagnosis not present

## 2021-02-17 DIAGNOSIS — D2361 Other benign neoplasm of skin of right upper limb, including shoulder: Secondary | ICD-10-CM | POA: Diagnosis not present

## 2021-02-17 DIAGNOSIS — D485 Neoplasm of uncertain behavior of skin: Secondary | ICD-10-CM | POA: Diagnosis not present

## 2021-02-17 DIAGNOSIS — X32XXXS Exposure to sunlight, sequela: Secondary | ICD-10-CM | POA: Diagnosis not present

## 2021-02-19 LAB — CBC WITH DIFFERENTIAL/PLATELET
Absolute Monocytes: 244 cells/uL (ref 200–950)
Basophils Absolute: 42 cells/uL (ref 0–200)
Basophils Relative: 0.8 %
Eosinophils Absolute: 111 cells/uL (ref 15–500)
Eosinophils Relative: 2.1 %
HCT: 40.6 % (ref 35.0–45.0)
Hemoglobin: 13.3 g/dL (ref 11.7–15.5)
Lymphs Abs: 1574 cells/uL (ref 850–3900)
MCH: 28.2 pg (ref 27.0–33.0)
MCHC: 32.8 g/dL (ref 32.0–36.0)
MCV: 86.2 fL (ref 80.0–100.0)
MPV: 12.3 fL (ref 7.5–12.5)
Monocytes Relative: 4.6 %
Neutro Abs: 3328 cells/uL (ref 1500–7800)
Neutrophils Relative %: 62.8 %
Platelets: 258 10*3/uL (ref 140–400)
RBC: 4.71 10*6/uL (ref 3.80–5.10)
RDW: 12.3 % (ref 11.0–15.0)
Total Lymphocyte: 29.7 %
WBC: 5.3 10*3/uL (ref 3.8–10.8)

## 2021-02-19 LAB — COMPLETE METABOLIC PANEL WITH GFR
AG Ratio: 2 (calc) (ref 1.0–2.5)
ALT: 46 U/L — ABNORMAL HIGH (ref 6–29)
AST: 30 U/L (ref 10–35)
Albumin: 4.4 g/dL (ref 3.6–5.1)
Alkaline phosphatase (APISO): 155 U/L — ABNORMAL HIGH (ref 37–153)
BUN: 11 mg/dL (ref 7–25)
CO2: 25 mmol/L (ref 20–32)
Calcium: 9.2 mg/dL (ref 8.6–10.4)
Chloride: 107 mmol/L (ref 98–110)
Creat: 0.94 mg/dL (ref 0.50–1.03)
Globulin: 2.2 g/dL (calc) (ref 1.9–3.7)
Glucose, Bld: 84 mg/dL (ref 65–99)
Potassium: 4.7 mmol/L (ref 3.5–5.3)
Sodium: 141 mmol/L (ref 135–146)
Total Bilirubin: 0.5 mg/dL (ref 0.2–1.2)
Total Protein: 6.6 g/dL (ref 6.1–8.1)
eGFR: 74 mL/min/{1.73_m2} (ref >=60)

## 2021-02-19 LAB — VITAMIN B1: Vitamin B1 (Thiamine): 11 nmol/L (ref 8–30)

## 2021-02-19 LAB — VITAMIN E
Gamma-Tocopherol (Vit E): <1 mg/L (ref <=4.3)
Vitamin E (Alpha Tocopherol): 42 mg/L — ABNORMAL HIGH (ref 5.7–19.9)

## 2021-02-19 LAB — VITAMIN B12: Vitamin B-12: 420 pg/mL (ref 200–1100)

## 2021-02-19 LAB — VITAMIN B6: Vitamin B6: 4.6 ng/mL (ref 2.1–21.7)

## 2021-02-19 LAB — VITAMIN D 25 HYDROXY (VIT D DEFICIENCY, FRACTURES): Vit D, 25-Hydroxy: 29 ng/mL — ABNORMAL LOW (ref 30–100)

## 2021-02-19 LAB — FERRITIN: Ferritin: 10 ng/mL — ABNORMAL LOW (ref 16–232)

## 2021-02-19 LAB — VITAMIN B2(RIBOFLAVIN),PLASMA: Vitamin B2(Riboflavin),Plasma: 7 nmol/L (ref 6.2–39.0)

## 2021-02-19 LAB — VITAMIN A: Vitamin A (Retinoic Acid): 36 ug/dL — ABNORMAL LOW (ref 38–98)

## 2021-03-20 DIAGNOSIS — T182XXA Foreign body in stomach, initial encounter: Secondary | ICD-10-CM | POA: Diagnosis not present

## 2021-03-20 DIAGNOSIS — Z9884 Bariatric surgery status: Secondary | ICD-10-CM | POA: Diagnosis not present

## 2021-03-20 DIAGNOSIS — X58XXXA Exposure to other specified factors, initial encounter: Secondary | ICD-10-CM | POA: Diagnosis not present

## 2021-03-20 DIAGNOSIS — K315 Obstruction of duodenum: Secondary | ICD-10-CM | POA: Diagnosis not present

## 2021-03-20 DIAGNOSIS — T183XXA Foreign body in small intestine, initial encounter: Secondary | ICD-10-CM | POA: Diagnosis not present

## 2021-03-25 DIAGNOSIS — K56600 Partial intestinal obstruction, unspecified as to cause: Secondary | ICD-10-CM | POA: Diagnosis not present

## 2021-04-08 DIAGNOSIS — Z01818 Encounter for other preprocedural examination: Secondary | ICD-10-CM | POA: Diagnosis not present

## 2021-04-14 DIAGNOSIS — F449 Dissociative and conversion disorder, unspecified: Secondary | ICD-10-CM | POA: Diagnosis not present

## 2021-04-14 DIAGNOSIS — Z9884 Bariatric surgery status: Secondary | ICD-10-CM | POA: Diagnosis not present

## 2021-04-14 DIAGNOSIS — K449 Diaphragmatic hernia without obstruction or gangrene: Secondary | ICD-10-CM | POA: Diagnosis not present

## 2021-04-14 DIAGNOSIS — K760 Fatty (change of) liver, not elsewhere classified: Secondary | ICD-10-CM | POA: Diagnosis not present

## 2021-04-14 DIAGNOSIS — K219 Gastro-esophageal reflux disease without esophagitis: Secondary | ICD-10-CM | POA: Diagnosis not present

## 2021-04-14 DIAGNOSIS — K458 Other specified abdominal hernia without obstruction or gangrene: Secondary | ICD-10-CM | POA: Diagnosis not present

## 2021-04-14 DIAGNOSIS — G43909 Migraine, unspecified, not intractable, without status migrainosus: Secondary | ICD-10-CM | POA: Diagnosis not present

## 2021-04-14 DIAGNOSIS — L719 Rosacea, unspecified: Secondary | ICD-10-CM | POA: Diagnosis not present

## 2021-04-14 DIAGNOSIS — J4530 Mild persistent asthma, uncomplicated: Secondary | ICD-10-CM | POA: Diagnosis not present

## 2021-04-14 DIAGNOSIS — K56600 Partial intestinal obstruction, unspecified as to cause: Secondary | ICD-10-CM | POA: Diagnosis not present

## 2021-04-14 DIAGNOSIS — E785 Hyperlipidemia, unspecified: Secondary | ICD-10-CM | POA: Diagnosis not present

## 2021-04-14 DIAGNOSIS — M797 Fibromyalgia: Secondary | ICD-10-CM | POA: Diagnosis not present

## 2021-04-14 DIAGNOSIS — E559 Vitamin D deficiency, unspecified: Secondary | ICD-10-CM | POA: Diagnosis not present

## 2021-04-14 DIAGNOSIS — G8929 Other chronic pain: Secondary | ICD-10-CM | POA: Diagnosis not present

## 2021-04-14 DIAGNOSIS — G2581 Restless legs syndrome: Secondary | ICD-10-CM | POA: Diagnosis not present

## 2021-04-14 DIAGNOSIS — F319 Bipolar disorder, unspecified: Secondary | ICD-10-CM | POA: Diagnosis not present

## 2021-04-14 DIAGNOSIS — F419 Anxiety disorder, unspecified: Secondary | ICD-10-CM | POA: Diagnosis not present

## 2021-04-14 DIAGNOSIS — K5651 Intestinal adhesions [bands], with partial obstruction: Secondary | ICD-10-CM | POA: Diagnosis not present

## 2021-04-21 DIAGNOSIS — E860 Dehydration: Secondary | ICD-10-CM | POA: Diagnosis not present

## 2021-04-21 DIAGNOSIS — M25519 Pain in unspecified shoulder: Secondary | ICD-10-CM | POA: Diagnosis not present

## 2021-04-21 DIAGNOSIS — Z888 Allergy status to other drugs, medicaments and biological substances status: Secondary | ICD-10-CM | POA: Diagnosis not present

## 2021-04-21 DIAGNOSIS — Z79899 Other long term (current) drug therapy: Secondary | ICD-10-CM | POA: Diagnosis not present

## 2021-04-21 DIAGNOSIS — J45909 Unspecified asthma, uncomplicated: Secondary | ICD-10-CM | POA: Diagnosis not present

## 2021-04-21 DIAGNOSIS — Z8719 Personal history of other diseases of the digestive system: Secondary | ICD-10-CM | POA: Diagnosis not present

## 2021-04-21 DIAGNOSIS — E46 Unspecified protein-calorie malnutrition: Secondary | ICD-10-CM | POA: Diagnosis not present

## 2021-04-21 DIAGNOSIS — Z9884 Bariatric surgery status: Secondary | ICD-10-CM | POA: Diagnosis not present

## 2021-04-21 DIAGNOSIS — E785 Hyperlipidemia, unspecified: Secondary | ICD-10-CM | POA: Diagnosis not present

## 2021-04-21 DIAGNOSIS — Z881 Allergy status to other antibiotic agents status: Secondary | ICD-10-CM | POA: Diagnosis not present

## 2021-04-21 DIAGNOSIS — Z882 Allergy status to sulfonamides status: Secondary | ICD-10-CM | POA: Diagnosis not present

## 2021-04-22 DIAGNOSIS — E785 Hyperlipidemia, unspecified: Secondary | ICD-10-CM | POA: Diagnosis not present

## 2021-04-22 DIAGNOSIS — E860 Dehydration: Secondary | ICD-10-CM | POA: Diagnosis not present

## 2021-04-22 DIAGNOSIS — Z882 Allergy status to sulfonamides status: Secondary | ICD-10-CM | POA: Diagnosis not present

## 2021-04-22 DIAGNOSIS — Z8719 Personal history of other diseases of the digestive system: Secondary | ICD-10-CM | POA: Diagnosis not present

## 2021-04-22 DIAGNOSIS — K911 Postgastric surgery syndromes: Secondary | ICD-10-CM | POA: Diagnosis not present

## 2021-04-22 DIAGNOSIS — M25519 Pain in unspecified shoulder: Secondary | ICD-10-CM | POA: Diagnosis not present

## 2021-04-22 DIAGNOSIS — E46 Unspecified protein-calorie malnutrition: Secondary | ICD-10-CM | POA: Diagnosis not present

## 2021-04-22 DIAGNOSIS — Z9884 Bariatric surgery status: Secondary | ICD-10-CM | POA: Diagnosis not present

## 2021-04-22 DIAGNOSIS — Z881 Allergy status to other antibiotic agents status: Secondary | ICD-10-CM | POA: Diagnosis not present

## 2021-04-22 DIAGNOSIS — Z888 Allergy status to other drugs, medicaments and biological substances status: Secondary | ICD-10-CM | POA: Diagnosis not present

## 2021-04-22 DIAGNOSIS — J45909 Unspecified asthma, uncomplicated: Secondary | ICD-10-CM | POA: Diagnosis not present

## 2021-04-22 DIAGNOSIS — K224 Dyskinesia of esophagus: Secondary | ICD-10-CM | POA: Diagnosis not present

## 2021-04-22 DIAGNOSIS — Z79899 Other long term (current) drug therapy: Secondary | ICD-10-CM | POA: Diagnosis not present

## 2021-04-22 DIAGNOSIS — K449 Diaphragmatic hernia without obstruction or gangrene: Secondary | ICD-10-CM | POA: Diagnosis not present

## 2021-04-28 DIAGNOSIS — J4530 Mild persistent asthma, uncomplicated: Secondary | ICD-10-CM | POA: Diagnosis not present

## 2021-04-28 DIAGNOSIS — F449 Dissociative and conversion disorder, unspecified: Secondary | ICD-10-CM | POA: Diagnosis not present

## 2021-04-28 DIAGNOSIS — G43909 Migraine, unspecified, not intractable, without status migrainosus: Secondary | ICD-10-CM | POA: Diagnosis not present

## 2021-04-28 DIAGNOSIS — K224 Dyskinesia of esophagus: Secondary | ICD-10-CM | POA: Diagnosis not present

## 2021-04-28 DIAGNOSIS — F319 Bipolar disorder, unspecified: Secondary | ICD-10-CM | POA: Diagnosis not present

## 2021-04-28 DIAGNOSIS — Z882 Allergy status to sulfonamides status: Secondary | ICD-10-CM | POA: Diagnosis not present

## 2021-04-28 DIAGNOSIS — Z91040 Latex allergy status: Secondary | ICD-10-CM | POA: Diagnosis not present

## 2021-04-28 DIAGNOSIS — K911 Postgastric surgery syndromes: Secondary | ICD-10-CM | POA: Diagnosis not present

## 2021-04-28 DIAGNOSIS — K654 Sclerosing mesenteritis: Secondary | ICD-10-CM | POA: Diagnosis not present

## 2021-04-28 DIAGNOSIS — K5651 Intestinal adhesions [bands], with partial obstruction: Secondary | ICD-10-CM | POA: Diagnosis not present

## 2021-04-28 DIAGNOSIS — Z9049 Acquired absence of other specified parts of digestive tract: Secondary | ICD-10-CM | POA: Diagnosis not present

## 2021-04-28 DIAGNOSIS — E46 Unspecified protein-calorie malnutrition: Secondary | ICD-10-CM | POA: Diagnosis not present

## 2021-04-28 DIAGNOSIS — E559 Vitamin D deficiency, unspecified: Secondary | ICD-10-CM | POA: Diagnosis not present

## 2021-04-28 DIAGNOSIS — Z79899 Other long term (current) drug therapy: Secondary | ICD-10-CM | POA: Diagnosis not present

## 2021-04-28 DIAGNOSIS — Z90710 Acquired absence of both cervix and uterus: Secondary | ICD-10-CM | POA: Diagnosis not present

## 2021-04-28 DIAGNOSIS — E860 Dehydration: Secondary | ICD-10-CM | POA: Diagnosis not present

## 2021-04-28 DIAGNOSIS — R1013 Epigastric pain: Secondary | ICD-10-CM | POA: Diagnosis not present

## 2021-04-28 DIAGNOSIS — R6339 Other feeding difficulties: Secondary | ICD-10-CM | POA: Diagnosis not present

## 2021-04-28 DIAGNOSIS — R110 Nausea: Secondary | ICD-10-CM | POA: Diagnosis not present

## 2021-04-28 DIAGNOSIS — E785 Hyperlipidemia, unspecified: Secondary | ICD-10-CM | POA: Diagnosis not present

## 2021-04-28 DIAGNOSIS — Z885 Allergy status to narcotic agent status: Secondary | ICD-10-CM | POA: Diagnosis not present

## 2021-04-28 DIAGNOSIS — Z91041 Radiographic dye allergy status: Secondary | ICD-10-CM | POA: Diagnosis not present

## 2021-05-01 ENCOUNTER — Other Ambulatory Visit: Payer: Self-pay

## 2021-05-01 ENCOUNTER — Encounter: Payer: Self-pay | Admitting: Medical-Surgical

## 2021-05-01 ENCOUNTER — Ambulatory Visit (INDEPENDENT_AMBULATORY_CARE_PROVIDER_SITE_OTHER): Payer: BC Managed Care – PPO | Admitting: Medical-Surgical

## 2021-05-01 ENCOUNTER — Telehealth: Payer: Self-pay | Admitting: Family Medicine

## 2021-05-01 VITALS — BP 123/85 | HR 79 | Resp 20 | Ht 66.0 in | Wt 148.0 lb

## 2021-05-01 DIAGNOSIS — B372 Candidiasis of skin and nail: Secondary | ICD-10-CM | POA: Diagnosis not present

## 2021-05-01 DIAGNOSIS — G2581 Restless legs syndrome: Secondary | ICD-10-CM

## 2021-05-01 DIAGNOSIS — Z7689 Persons encountering health services in other specified circumstances: Secondary | ICD-10-CM

## 2021-05-01 MED ORDER — ROPINIROLE HCL 0.25 MG PO TABS: 0.2500 mg | ORAL_TABLET | Freq: Every day | ORAL | 0 refills | Status: DC

## 2021-05-01 MED ORDER — FLUCONAZOLE 150 MG PO TABS: 150.0000 mg | ORAL_TABLET | Freq: Once | ORAL | 0 refills | Status: AC

## 2021-05-01 MED ORDER — NYSTATIN 100000 UNIT/GM EX POWD: 1.0000 "application " | Freq: Three times a day (TID) | CUTANEOUS | 2 refills | Status: DC

## 2021-05-01 NOTE — Telephone Encounter (Signed)
Please call patient and see if we can get her scheduled for follow-up later this month to follow-up on some of her vitamin deficiencies that were found at the end of July so that we can see what needs to be rechecked and establish care with a new PCP.  Also wanted to let her know that her vitamin A was also low.  I would recommend foods that are rich in vitamin A such as beef liver, goat cheese, cheddar cheese, Trout, sweet potatoes, winter squash, kale, colors, turn green, carrots and sweet red peppers.

## 2021-05-01 NOTE — Telephone Encounter (Signed)
LVM informing patient of recommendations and to set up with a new PCP (Joy Jessup or Dr. Zigmund Daniel) before the end of the year.

## 2021-05-01 NOTE — Progress Notes (Signed)
  HPI with pertinent ROS:   CC: Rash, transfer of care  HPI: Pleasant 50 year old female presenting today for transfer of care and to discuss her rash that started yesterday under of her bilateral breast.  She had surgery for bowel dissection on Tuesday and has been recovering at home.  She has been laying around and relaxing while she recovers but was told to avoid wearing a bra.  She does have pendulous breasts and admits to staying moist under her breast quite frequently.  The rash was noted yesterday evening and into the night when she found herself scratching.  Notes the rash is quite itchy.  Has not tried anything on the site but is significantly worried that this may be an infection that affects her surgical incisions.  Has a handicap parking placard form that she would like filled out as she is currently unable to walk greater than 200 feet and has to use assistance when she does ambulate.  Has significant difficulty with restless leg syndrome and notes that the increase of Abilify to 10 mg has not made much of a difference.  Would like to restart a small dose of ropinirole at bedtime to see if this will help.  I reviewed the past medical history, family history, social history, surgical history, and allergies today and no changes were needed.  Please see the problem list section below in epic for further details.   Physical exam:   General: Well Developed, well nourished, and in no acute distress.  Neuro: Alert and oriented x3.  HEENT: Normocephalic, atraumatic.  Skin: Warm and dry.  Scattered erythematous maculopapular rash under bilateral breasts, no vesicles or pustules noted Cardiac: Regular rate and rhythm.  Respiratory: Not using accessory muscles, speaking in full sentences.  Impression and Recommendations:    1. Encounter to establish care Reviewed available information and discussed care concerns with patient.   2. Yeast dermatitis Nystatin powder 3 times daily to  affected area.  We will go ahead and treat empirically with fluconazole x1 dose given her recent surgery.  Discussed ways to keep the area under her breast dry while she is unable to wear a bra. - nystatin (MYCOSTATIN/NYSTOP) powder; Apply 1 application topically 3 (three) times daily.  Dispense: 15 g; Refill: 2 - fluconazole (DIFLUCAN) 150 MG tablet; Take 1 tablet (150 mg total) by mouth once for 1 dose.  Dispense: 1 tablet; Refill: 0  3. RLS (restless legs syndrome) Restarting ropinirole 0.25 mg at bedtime.  Continue Abilify as ordered.  Return if symptoms worsen or fail to improve. ___________________________________________ Clearnce Sorrel, DNP, APRN, FNP-BC [facility] [place]

## 2021-05-25 ENCOUNTER — Other Ambulatory Visit: Payer: Self-pay | Admitting: Osteopathic Medicine

## 2021-05-25 ENCOUNTER — Other Ambulatory Visit: Payer: Self-pay | Admitting: Medical-Surgical

## 2021-05-27 ENCOUNTER — Encounter: Payer: Self-pay | Admitting: Medical-Surgical

## 2021-05-27 ENCOUNTER — Ambulatory Visit (INDEPENDENT_AMBULATORY_CARE_PROVIDER_SITE_OTHER): Payer: BC Managed Care – PPO | Admitting: Medical-Surgical

## 2021-05-27 ENCOUNTER — Other Ambulatory Visit: Payer: Self-pay

## 2021-05-27 VITALS — BP 107/75 | HR 90 | Resp 20 | Ht 66.0 in | Wt 141.0 lb

## 2021-05-27 DIAGNOSIS — M62838 Other muscle spasm: Secondary | ICD-10-CM | POA: Diagnosis not present

## 2021-05-27 DIAGNOSIS — J0100 Acute maxillary sinusitis, unspecified: Secondary | ICD-10-CM | POA: Diagnosis not present

## 2021-05-27 DIAGNOSIS — Z13220 Encounter for screening for lipoid disorders: Secondary | ICD-10-CM

## 2021-05-27 DIAGNOSIS — Z23 Encounter for immunization: Secondary | ICD-10-CM

## 2021-05-27 LAB — LIPID PANEL
Cholesterol: 193 mg/dL (ref <200)
HDL: 47 mg/dL — ABNORMAL LOW (ref >=50)
LDL Cholesterol (Calc): 126 mg/dL (calc) — ABNORMAL HIGH
Non-HDL Cholesterol (Calc): 146 mg/dL (calc) — ABNORMAL HIGH (ref <130)
Total CHOL/HDL Ratio: 4.1 (calc) (ref <5.0)
Triglycerides: 98 mg/dL (ref <150)

## 2021-05-27 MED ORDER — CYCLOBENZAPRINE HCL 10 MG PO TABS: 5.0000 mg | ORAL_TABLET | Freq: Three times a day (TID) | ORAL | 0 refills | Status: DC | PRN

## 2021-05-27 MED ORDER — AMOXICILLIN-POT CLAVULANATE 875-125 MG PO TABS: 1.0000 | ORAL_TABLET | Freq: Two times a day (BID) | ORAL | 0 refills | Status: DC

## 2021-05-27 NOTE — Progress Notes (Signed)
  HPI with pertinent ROS:   CC:  migraines  HPI: Pleasant 50 year old female presenting today with complaints of 2 weeks of headache that is unlike her normal migraines.  Her normal migraines occur on the right side of her head and half for 30 years.  This pain is on the left side of her head at the parietal area.  She notes that it has not been every day but most often is brought on by forceful coughing or vomiting.  Notes the pain is initially is sharp and stabbing immediately after the coughing or vomiting episode and then proceeds to hurt for several days.  She has no vision changes or neurological deficits.  No hearing changes.  Notes the pain extends to her teeth, jaw, and ear on the left side.  Admits that her speech may have been slightly slurred last night according to her husband but has had no other changes.  She does have a family history of strokes and is worried about a blood clot.  Last lipid panel checked in April and has not been rechecked since her gastric bypass.  Today, she would like to have a lipid panel drawn to see how things are.  Notes that she has been getting over "the crud" and has been coughing quite a bit.  Does have sinus congestion as well as dental pain, facial pain, and.  Has had "underwater" sensations in the left ear.  No fevers, chills, chest pain, or shortness of breath.  I reviewed the past medical history, family history, social history, surgical history, and allergies today and no changes were needed.  Please see the problem list section below in epic for further details.   Physical exam:   General: Well Developed, well nourished, and in no acute distress.  Neuro: Alert and oriented x3.  HEENT: Normocephalic, atraumatic.  Bilateral maxillary sinus tenderness.  Tenderness to the submandibular lymph nodes and along the left side of the neck posterior to the ear.  Left TM slightly bulging with mild displacement of the cone of light. Skin: Warm and  dry. Cardiac: Regular rate and rhythm, no murmurs rubs or gallops, no lower extremity edema.  Respiratory: Clear to auscultation bilaterally. Not using accessory muscles, speaking in full sentences. Neck: No limitations to range of motion however the cervical paraspinal muscles into the trapezius are tense and slightly swollen, tender to palpation.  Impression and Recommendations:    1. Acute non-recurrent maxillary sinusitis With facial pain, maxillary sinus tenderness, and recent upper respiratory illness, treating with Augmentin twice daily x7 days for sinus infection.  2. Muscle spasm Questionable results with Robaxin so sending in a small supply of Flexeril 5-10 mg 3 times daily as needed to see if this is more efficient.  Suspect muscle spasm accompanied by sinusitis is the cause for her head pain.  Unable to tolerate steroids or anti-inflammatories due to allergies/intolerances and recent gastric bypass.  3. Lipid screening Checking lipid panel today. - Lipid panel  4. Flu vaccine need Flu vaccine given in office today. - Flu Vaccine QUAD 78mo+IM (Fluarix, Fluzone & Alfiuria Quad PF)  5. Need for shingles vaccine Shingrix No. 2 given in office today. - Varicella-zoster vaccine IM (Shingrix)  Return if symptoms worsen or fail to improve. ___________________________________________ Clearnce Sorrel, DNP, APRN, FNP-BC [facility] [place]

## 2021-05-28 DIAGNOSIS — Z1231 Encounter for screening mammogram for malignant neoplasm of breast: Secondary | ICD-10-CM | POA: Diagnosis not present

## 2021-05-28 LAB — HM MAMMOGRAPHY

## 2021-05-29 DIAGNOSIS — Z9884 Bariatric surgery status: Secondary | ICD-10-CM | POA: Diagnosis not present

## 2021-05-29 DIAGNOSIS — Z713 Dietary counseling and surveillance: Secondary | ICD-10-CM | POA: Diagnosis not present

## 2021-06-05 ENCOUNTER — Encounter: Payer: Self-pay | Admitting: Medical-Surgical

## 2021-06-23 ENCOUNTER — Other Ambulatory Visit: Payer: Self-pay | Admitting: Medical-Surgical

## 2021-06-24 ENCOUNTER — Ambulatory Visit: Payer: BC Managed Care – PPO | Admitting: Family Medicine

## 2021-07-07 ENCOUNTER — Encounter: Payer: Self-pay | Admitting: Medical-Surgical

## 2021-07-07 DIAGNOSIS — E782 Mixed hyperlipidemia: Secondary | ICD-10-CM

## 2021-07-08 MED ORDER — PRAVASTATIN SODIUM 10 MG PO TABS: 10.0000 mg | ORAL_TABLET | Freq: Every day | ORAL | 0 refills | Status: DC

## 2021-07-20 ENCOUNTER — Other Ambulatory Visit: Payer: Self-pay | Admitting: Medical-Surgical

## 2021-07-23 ENCOUNTER — Other Ambulatory Visit: Payer: Self-pay

## 2021-07-23 ENCOUNTER — Ambulatory Visit: Payer: BC Managed Care – PPO | Admitting: Family Medicine

## 2021-07-23 ENCOUNTER — Encounter: Payer: Self-pay | Admitting: Family Medicine

## 2021-07-23 VITALS — Ht 66.0 in | Wt 141.0 lb

## 2021-07-23 DIAGNOSIS — S060XAA Concussion with loss of consciousness status unknown, initial encounter: Secondary | ICD-10-CM | POA: Insufficient documentation

## 2021-07-23 DIAGNOSIS — G43811 Other migraine, intractable, with status migrainosus: Secondary | ICD-10-CM | POA: Diagnosis not present

## 2021-07-23 DIAGNOSIS — S060X0A Concussion without loss of consciousness, initial encounter: Secondary | ICD-10-CM | POA: Diagnosis not present

## 2021-07-23 MED ORDER — ONDANSETRON HCL 4 MG PO TABS
4.0000 mg | ORAL_TABLET | Freq: Once | ORAL | Status: AC
  Administered 2021-07-23: 12:00:00 4 mg via ORAL

## 2021-07-23 MED ORDER — ONDANSETRON 4 MG PO TBDP: 4.0000 mg | ORAL_TABLET | Freq: Once | ORAL | Status: DC

## 2021-07-23 MED ORDER — KETOROLAC TROMETHAMINE 60 MG/2ML IM SOLN
60.0000 mg | Freq: Once | INTRAMUSCULAR | Status: AC
Start: 2021-07-23 — End: 2021-07-23
  Administered 2021-07-23: 12:00:00 60 mg via INTRAMUSCULAR

## 2021-07-23 NOTE — Patient Instructions (Signed)
Concussion, Adult ?A concussion is a brain injury from a hard, direct hit (trauma) to your head or body. This direct hit causes your brain to quickly shake back and forth inside your skull. A concussion may also be called a mild traumatic brain injury (TBI). Healing from this injury can take time. ?What are the causes? ?This condition is caused by: ?A direct hit to your head, such as: ?Running into a player during a game. ?Being hit in a fight. ?Hitting your head on a hard surface. ?A quick and sudden movement of the head or neck, such as in a car crash. ?What are the signs or symptoms? ?The signs of a concussion can be hard to notice. They may be missed by you, family members, and doctors. You may look fine on the outside but may not act or feel normal. ?Physical symptoms ?Headaches. ?Being dizzy. ?Problems with body balance. ?Being sensitive to light or noise. ?Vomiting or feeling like you may vomit. ?Being tired. ?Problems seeing or hearing. ?Not sleeping or eating as you used to. ?Seizure. ?Mental and emotional symptoms ?Feeling grouchy (irritable). ?Having mood changes. ?Problems remembering things. ?Trouble focusing your mind (concentrating), organizing, or making decisions. ?Being slow to think, act, react, speak, or read. ?Feeling worried or nervous (anxious). ?Feeling sad (depressed). ?How is this treated? ?This condition may be treated by: ?Stopping sports or activity if you are injured. If you hit your head or have signs of concussion: ?Do not return to sports or activities the same day. ?Get checked by a doctor before you return to your activities. ?Resting your body and your mind. ?Being watched carefully, often at home. ?Medicines to help with symptoms such as: ?Headaches. ?Feeling like you may vomit. ?Problems with sleep. ?Avoiding alcohol and drugs. ?Being asked to go to a concussion clinic or a place to help you recover (rehabilitation center). ?Recovery from a concussion can take time. Return to  activities only: ?When you are fully healed. ?When your doctor says it is safe. ?Avoid taking strong pain medicines (opioids) for a concussion. ?Follow these instructions at home: ?Activity ?Limit activities that need a lot of thought or focus, such as: ?Homework or work for your job. ?Watching TV. ?Using the computer or phone. ?Playing memory games and puzzles. ?Rest. Rest helps your brain heal. Make sure you: ?Get plenty of sleep. Most adults should get 7-9 hours of sleep each night. ?Rest during the day. Take naps or breaks when you feel tired. ?Avoid activity like exercise until your doctor says its safe. Stop any activity that makes symptoms worse. ?Do not do activities that could cause a second concussion, such as riding a bike or playing sports. ?Ask your doctor when you can return to your normal activities, such as school, work, sports, and driving. Your ability to react may be slower. Do not do these activities if you are dizzy. ?General instructions ? ?Take over-the-counter and prescription medicines only as told by your doctor. ?Do not drink alcohol until your doctor says you can. ?Watch your symptoms and tell other people to do the same. Other problems can occur after a concussion. Older adults have a higher risk of serious problems. ?Tell your work manager, teachers, school nurse, school counselor, coach, or athletic trainer about your injury and symptoms. Tell them about what you can or cannot do. ?Keep all follow-up visits as told by your doctor. This is important. ?How is this prevented? ?It is very important that you do not get another brain injury. In rare   cases, another injury can cause brain damage that will not go away, brain swelling, or death. The risk of this is greatest in the first 7-10 days after a head injury. To avoid injuries: ?Stop activities that could lead to a second concussion, such as contact sports, until your doctor says it is okay. ?When you return to sports or activities: ?Do  not crash into other players. This is how most concussions happen. ?Follow the rules. ?Respect other players. Do not engage in violent behavior while playing. ?Get regular exercise. Do strength and balance training. ?Wear a helmet that fits you well during sports, biking, or other activities. ?Helmets can help protect you from serious skull and brain injuries, but they do not protect you from a concussion. Even when wearing a helmet, you should avoid being hit in the head. ?Contact a doctor if: ?Your symptoms do not get better. ?You have new symptoms. ?You have another injury. ?Get help right away if: ?You have bad headaches or your headaches get worse. ?You feel weak or numb in any part of your body. ?You feel mixed up (confused). ?Your balance gets worse. ?You vomit often. ?You feel more sleepy than normal. ?You cannot speak well, or have slurred speech. ?You have a seizure. ?Others have trouble waking you up. ?You have changes in how you act. ?You have changes in how you see (vision). ?You pass out (lose consciousness). ?These symptoms may be an emergency. Do not wait to see if the symptoms will go away. Get medical help right away. Call your local emergency services (911 in the [place] Do not drive yourself to the hospital. ?Summary ?A concussion is a brain injury from a hard, direct hit (trauma) to your head or body. ?This condition is treated with rest and careful watching of symptoms. ?Ask your doctor when you can return to your normal activities, such as school, work, or driving. ?Get help right away if you have a very bad headache, feel weak in any part of your body, have a seizure, have changes in how you act or see, or if you are mixed up or more sleepy than normal. ?This information is not intended to replace advice given to you by your health care provider. Make sure you discuss any questions you have with your health care provider. ?Document Revised: 09/25/2020 Document Reviewed: 09/25/2020 ?Elsevier  Patient Education ? 2022 [employer] ? ?

## 2021-07-23 NOTE — Progress Notes (Signed)
Rachel Rich - 50 y.o. female MRN [id number]  Date of birth: June 15, 1971  Subjective Chief Complaint  Patient presents with   Head Injury    HPI Rachel Rich is a 50 year old female here today with complaint of recent fall.  She was walking her dog and tripped going down the stairs hitting her head on concrete at the bottom of the stairs.  There was no loss of consciousness.  She has had increased headache since this time.  She denies any significant neurological changes including weakness, numbness, slurred speech.  She has had mild vision changes which are typical with her migraines.  Regular migraine medications have helped some but she continues to have headache.  ROS:  A comprehensive ROS was completed and negative except as noted per HPI  Allergies  Allergen Reactions   Codeine Rash, Shortness Of Breath and Swelling   Elemental Sulfur Anaphylaxis   Erythromycin Hives, Rash and Anaphylaxis   Glucosamine Swelling   Hydrocodone-Acetaminophen Swelling and Anaphylaxis   Iodinated Contrast Media Anaphylaxis and Rash    Rash and vomiting     Metrizamide Anaphylaxis    Rash and vomiting   Morphine Shortness Of Breath, Swelling and Hives   Morphine And Related Swelling, Rash and Shortness Of Breath   Nitrofurantoin Swelling   Prednisone Itching, Rash and Anaphylaxis   Shellfish Allergy Swelling and Anaphylaxis   Topiramate Hives, Itching and Swelling    High dose caused vision problems but low dose tolerates fine     Tramadol Hives   Wound Dressing Adhesive Other (See Comments)    Causes redness and when removed after a long period of time tears skin. Pads for leads cause itching DERMABOND - BLISTERS   Cortisone Rash and Other (See Comments)    Anaphylaxis (outside records) however tolerated prednisone. Other reaction(s): Redness  Redness   Demeclocycline Rash    rash    Hydrocodone Swelling   Meperidine Other (See Comments), Itching and Rash    Can take intramuscular.    Shellfish-Derived Products Swelling   Sulfa Antibiotics Rash and Swelling   Sulfasalazine Rash and Swelling   Tetracycline Rash    rash   Tetracyclines & Related Rash    rash    Macrobid [Nitrofurantoin Monohyd Macro] Swelling   Tape Other (See Comments)    Causes redness and when removed after a long period of time tears skin. Pads for leads cause itching   Latex Rash   Other Other (See Comments)    Avoids milk alone, can tolerate other milk ingredients. MM, RD 10/24/18  Vitamins given with TPN infusion   Sulfamethoxazole-Trimethoprim Rash    Can't take by iv or by mouth    Past Medical History:  Diagnosis Date   Asthma, mild persistent 07/25/2014   Symbicort 160-4.5 outside records    Fibromyalgia 03/26/2014   Tramadol on outside records.    Hyperlipidemia    Menopause 07/25/2014   Estradiol 2mg  QD on outside records.    Other migraine with status migrainosus, not intractable 03/26/2014   RLS (restless legs syndrome) 03/26/2014   Rosacea 07/25/2014    Past Surgical History:  Procedure Laterality Date   ABDOMINAL HYSTERECTOMY  2009   masses on ovaries, endometriosis, no cancer diagnosis   APPENDECTOMY  1979   BARIATRIC SURGERY     done in [place]   BREAST LUMPECTOMY Left 2010   pre-cancerous    CESAREAN SECTION  2001,2007   x2    GALLBLADDER SURGERY  2000   HERNIA REPAIR  2009   ventral from c/s   KNEE SURGERY Right    multiple procedures - arthroscopic    TONSILLECTOMY  1976    Social History   Socioeconomic History   Marital status: Married    Spouse name: Not on file   Number of children: Not on file   Years of education: Not on file   Highest education level: Not on file  Occupational History   Not on file  Tobacco Use   Smoking status: Never   Smokeless tobacco: Never  Substance and Sexual Activity   Alcohol use: No   Drug use: Yes   Sexual activity: Yes    Partners: Male    Birth control/protection: Other-see comments    Comment: s/p hysterectomy    Other Topics Concern   Not on file  Social History Narrative   Not on file   Social Determinants of Health   Financial Resource Strain: Not on file  Food Insecurity: Not on file  Transportation Needs: Not on file  Physical Activity: Not on file  Stress: Not on file  Social Connections: Not on file    Family History  Problem Relation Age of Onset   Alcoholism Father    Heart attack Father    Hyperlipidemia Father    Cancer Mother    Depression Mother    Stroke Mother    Cancer Sister     Health Maintenance  Topic Date Due   HIV Screening  Never done   COVID-19 Vaccine (4 - Booster for Moderna series) 07/22/2020   COLONOSCOPY (Pts 45-50yrs Insurance coverage will need to be confirmed)  05/01/2022 (Originally 04/23/2015)   MAMMOGRAM  05/28/2022   TETANUS/TDAP  06/06/2028   INFLUENZA VACCINE  Completed   Hepatitis C Screening  Completed   Zoster Vaccines- Shingrix  Completed   HPV VACCINES  Aged Out   Pneumococcal Vaccine 24-47 Years old  Discontinued   PAP SMEAR-Modifier  Discontinued     ----------------------------------------------------------------------------------------------------------------------------------------------------------------------------------------------------------------- Physical Exam There were no vitals taken for this visit.  Physical Exam Constitutional:      Appearance: Normal appearance.  Eyes:     General: No scleral icterus. Cardiovascular:     Rate and Rhythm: Normal rate and regular rhythm.  Pulmonary:     Effort: Pulmonary effort is normal.     Breath sounds: Normal breath sounds.  Musculoskeletal:     Cervical back: Neck supple.  Neurological:     General: No focal deficit present.     Mental Status: She is alert.  Psychiatric:        Mood and Affect: Mood normal.        Behavior: Behavior normal.     ------------------------------------------------------------------------------------------------------------------------------------------------------------------------------------------------------------------- Assessment and Plan  Concussion Mild concussive symptoms.  Discussed limiting screen time, stay well-hydrated and obtaining adequate rest over the next few days.  Note written for her to remain out of work.  Treating acute headache with Toradol and Zofran.  Instructed to seek emergency care if having significantly worsening symptoms.   Meds ordered this encounter  Medications   DISCONTD: ondansetron (ZOFRAN-ODT) disintegrating tablet 4 mg   ketorolac (TORADOL) injection 60 mg   DISCONTD: ondansetron (ZOFRAN-ODT) disintegrating tablet 4 mg   ondansetron (ZOFRAN) tablet 4 mg    No follow-ups on file.    This visit occurred during the SARS-CoV-2 public health emergency.  Safety protocols were in place, including screening questions prior to the visit, additional usage of staff PPE, and extensive cleaning of exam room while  observing appropriate contact time as indicated for disinfecting solutions.  Needed

## 2021-07-23 NOTE — Assessment & Plan Note (Signed)
Mild concussive symptoms.  Discussed limiting screen time, stay well-hydrated and obtaining adequate rest over the next few days.  Note written for her to remain out of work.  Treating acute headache with Toradol and Zofran.  Instructed to seek emergency care if having significantly worsening symptoms.

## 2021-07-29 DIAGNOSIS — Z9884 Bariatric surgery status: Secondary | ICD-10-CM | POA: Diagnosis not present

## 2021-07-29 DIAGNOSIS — R112 Nausea with vomiting, unspecified: Secondary | ICD-10-CM | POA: Diagnosis not present

## 2021-07-29 DIAGNOSIS — R1012 Left upper quadrant pain: Secondary | ICD-10-CM | POA: Diagnosis not present

## 2021-07-29 DIAGNOSIS — E559 Vitamin D deficiency, unspecified: Secondary | ICD-10-CM | POA: Diagnosis not present

## 2021-07-31 ENCOUNTER — Other Ambulatory Visit: Payer: Self-pay | Admitting: Medical-Surgical

## 2021-07-31 DIAGNOSIS — Z9884 Bariatric surgery status: Secondary | ICD-10-CM | POA: Diagnosis not present

## 2021-07-31 DIAGNOSIS — R112 Nausea with vomiting, unspecified: Secondary | ICD-10-CM | POA: Diagnosis not present

## 2021-07-31 DIAGNOSIS — Z4802 Encounter for removal of sutures: Secondary | ICD-10-CM | POA: Diagnosis not present

## 2021-07-31 DIAGNOSIS — K6389 Other specified diseases of intestine: Secondary | ICD-10-CM | POA: Diagnosis not present

## 2021-07-31 DIAGNOSIS — T188XXA Foreign body in other parts of alimentary tract, initial encounter: Secondary | ICD-10-CM | POA: Diagnosis not present

## 2021-07-31 DIAGNOSIS — R1012 Left upper quadrant pain: Secondary | ICD-10-CM | POA: Diagnosis not present

## 2021-07-31 DIAGNOSIS — T182XXA Foreign body in stomach, initial encounter: Secondary | ICD-10-CM | POA: Diagnosis not present

## 2021-07-31 DIAGNOSIS — Z538 Procedure and treatment not carried out for other reasons: Secondary | ICD-10-CM | POA: Diagnosis not present

## 2021-07-31 DIAGNOSIS — Z1211 Encounter for screening for malignant neoplasm of colon: Secondary | ICD-10-CM | POA: Diagnosis not present

## 2021-08-03 ENCOUNTER — Encounter: Payer: Self-pay | Admitting: Physician Assistant

## 2021-08-03 ENCOUNTER — Telehealth (INDEPENDENT_AMBULATORY_CARE_PROVIDER_SITE_OTHER): Payer: BC Managed Care – PPO | Admitting: Physician Assistant

## 2021-08-03 DIAGNOSIS — R590 Localized enlarged lymph nodes: Secondary | ICD-10-CM | POA: Insufficient documentation

## 2021-08-03 DIAGNOSIS — R600 Localized edema: Secondary | ICD-10-CM | POA: Diagnosis not present
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
## 2021-08-04 ENCOUNTER — Encounter: Payer: Self-pay | Admitting: Physician Assistant

## 2021-08-04 MED ORDER — AMOXICILLIN 875 MG PO TABS: 875.0000 mg | ORAL_TABLET | Freq: Two times a day (BID) | ORAL | 0 refills | Status: DC

## 2021-08-17 ENCOUNTER — Telehealth: Payer: Self-pay | Admitting: Medical-Surgical

## 2021-08-17 NOTE — Telephone Encounter (Signed)
Pt called to schedule an appt for MS systems. You next available appt isnt until March 2nd. Also can it be a video visit or does it have to be in person?

## 2021-08-19 ENCOUNTER — Other Ambulatory Visit: Payer: Self-pay | Admitting: Medical-Surgical

## 2021-08-27 ENCOUNTER — Ambulatory Visit (INDEPENDENT_AMBULATORY_CARE_PROVIDER_SITE_OTHER): Payer: BC Managed Care – PPO | Admitting: Medical-Surgical

## 2021-08-27 ENCOUNTER — Other Ambulatory Visit: Payer: Self-pay | Admitting: Sports Medicine

## 2021-08-27 ENCOUNTER — Other Ambulatory Visit: Payer: Self-pay

## 2021-08-27 ENCOUNTER — Encounter: Payer: Self-pay | Admitting: Medical-Surgical

## 2021-08-27 VITALS — BP 114/76 | HR 79 | Resp 20 | Ht 66.0 in | Wt 125.0 lb

## 2021-08-27 DIAGNOSIS — R252 Cramp and spasm: Secondary | ICD-10-CM

## 2021-08-27 DIAGNOSIS — R519 Headache, unspecified: Secondary | ICD-10-CM | POA: Diagnosis not present

## 2021-08-27 DIAGNOSIS — G43811 Other migraine, intractable, with status migrainosus: Secondary | ICD-10-CM

## 2021-08-27 DIAGNOSIS — G43801 Other migraine, not intractable, with status migrainosus: Secondary | ICD-10-CM | POA: Diagnosis not present

## 2021-08-27 DIAGNOSIS — R1310 Dysphagia, unspecified: Secondary | ICD-10-CM

## 2021-08-27 MED ORDER — SUMATRIPTAN SUCCINATE 50 MG PO TABS: 50.0000 mg | ORAL_TABLET | Freq: Once | ORAL | 4 refills | Status: DC

## 2021-08-27 NOTE — Progress Notes (Signed)
°  HPI with pertinent ROS:   CC: Concern for MS  HPI: Pleasant 51 year old female presenting today to discuss evaluation for multiple sclerosis.  Notes that her older sister and several cousins have been diagnosed with MS and that she is very concerned and would like to be evaluated.  Notes that she has been having bilateral lower extremity cramping that causes inversion of the feet and can last for up to hours.  This happens intermittently and is resolved with use of heating pads and massage.  She also notes that her migraines have changed.  They previously affected on the right side but they have now started to affect the left side instead.  She is worried also because she has had chronic trouble with swallowing, especially noted since her gastric bypass surgery.  This was always attributed to her gastric bypass however her sister has a similar issue with swallowing and now she wonders if this is not something related to possible MS.  Migraines-has tried sumatriptan 25 mg but notes that this was not helpful at all.  Having at least 1 migraine per week that incapacitates her for a couple of days.  She has stopped most of the medications on her list since they were not helpful.  I reviewed the past medical history, family history, social history, surgical history, and allergies today and no changes were needed.  Please see the problem list section below in epic for further details.   Physical exam:   General: Well Developed, well nourished, and in no acute distress.  Neuro: Alert and oriented x3.  HEENT: Normocephalic, atraumatic.  Skin: Warm and dry. Cardiac: Regular rate and rhythm.  Respiratory: Not using accessory muscles, speaking in full sentences.  Impression and Recommendations:    1. Other migraine with status migrainosus, not intractable 2. Worsening headaches 3. Leg cramps 4. Dysphagia, unspecified type Reviewed typical and atypical symptoms of MS.  Low suspicion for MS given her  current symptoms however we do have a worsening any change in her migraines.  Ordering MRI of the brain without contrast for further evaluation.  Increasing sumatriptan to 50 mg once, repeat dose in 2 hours if necessary.  If this is not helpful, advised that she can try taking 100 mg once with a repeat in 2 hours if no relief.  At that point, if ineffective, we will need to switch agents.  Return if symptoms worsen or fail to improve. ___________________________________________ Clearnce Sorrel, DNP, APRN, FNP-BC [facility] [place]

## 2021-08-28 DIAGNOSIS — Z1211 Encounter for screening for malignant neoplasm of colon: Secondary | ICD-10-CM | POA: Diagnosis not present

## 2021-08-28 DIAGNOSIS — I700 Atherosclerosis of aorta: Secondary | ICD-10-CM | POA: Diagnosis not present

## 2021-08-28 DIAGNOSIS — K760 Fatty (change of) liver, not elsewhere classified: Secondary | ICD-10-CM | POA: Diagnosis not present

## 2021-08-30 ENCOUNTER — Ambulatory Visit (INDEPENDENT_AMBULATORY_CARE_PROVIDER_SITE_OTHER): Payer: BC Managed Care – PPO

## 2021-08-30 ENCOUNTER — Other Ambulatory Visit: Payer: Self-pay

## 2021-08-30 DIAGNOSIS — R252 Cramp and spasm: Secondary | ICD-10-CM | POA: Diagnosis not present

## 2021-08-30 DIAGNOSIS — R519 Headache, unspecified: Secondary | ICD-10-CM | POA: Diagnosis not present

## 2021-08-30 DIAGNOSIS — R1310 Dysphagia, unspecified: Secondary | ICD-10-CM

## 2021-08-30 DIAGNOSIS — G43801 Other migraine, not intractable, with status migrainosus: Secondary | ICD-10-CM | POA: Diagnosis not present

## 2021-08-30 DIAGNOSIS — G43909 Migraine, unspecified, not intractable, without status migrainosus: Secondary | ICD-10-CM | POA: Diagnosis not present

## 2021-08-30 IMAGING — MR MR HEAD W/O CM
10 series · 48 of 48 positions shown · non-contrast
Comparison: Head CT [DATE] (images available, report
unavailable).

CLINICAL DATA: Provided history: Other migraine with status
migrainosus, not intractable. Leg cramps. Dysphagia, unspecified
type. Worsening headaches. Headache, new or worsening. Additional
history provided by scanning technologist: Patient reports
headaches, dizziness, fatigue, numbness and weakness in hands and
legs for several years.

EXAM:
MRI HEAD WITHOUT CONTRAST
TECHNIQUE: Multiplanar, multiecho pulse sequences of the brain and surrounding
structures were obtained without intravenous contrast.

[Series 2: DWI · axial · 3.0mm · 1.46mm/px · z∈[-84,+85]mm · 9 of 113 slices shown (1 of 4)]
[im 1/113]
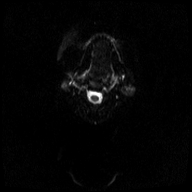
[im 15/113]
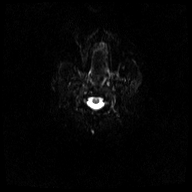
[im 29/113]
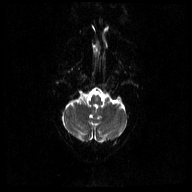
[im 43/113]
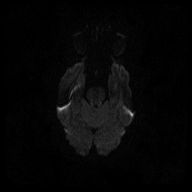
[im 57/113]
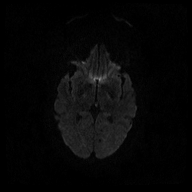
[im 71/113]
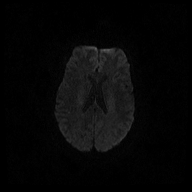
[im 85/113]
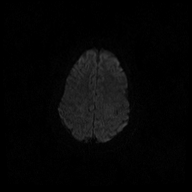
[im 99/113]
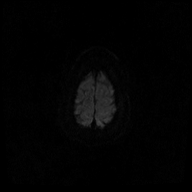
[im 113/113]
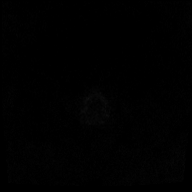

[Series 3: DWI · axial · 3.0mm · 1.46mm/px · z∈[-84,+85]mm · 5 of 57 slices shown (2 of 4)]
[im 1/57]
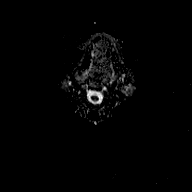
[im 15/57]
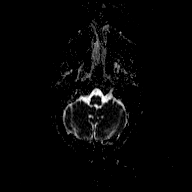
[im 29/57]
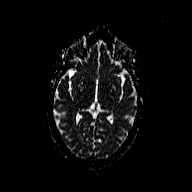
[im 43/57]
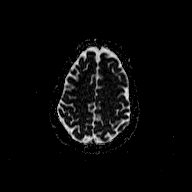
[im 57/57]
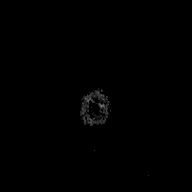

[Series 4: DWI · coronal · 5.0mm · 1.46mm/px · 5 of 62 slices shown (3 of 4)]
[im 1/62]
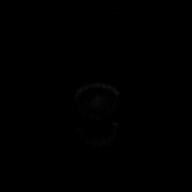
[im 16/62]
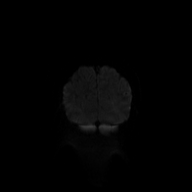
[im 31/62]
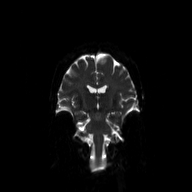
[im 46/62]
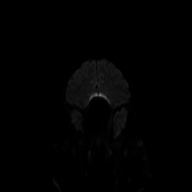
[im 62/62]
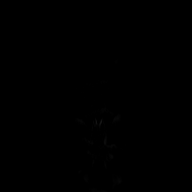

[Series 5: DWI · coronal · 5.0mm · 1.46mm/px · 3 of 32 slices shown (4 of 4)]
[im 1/32]
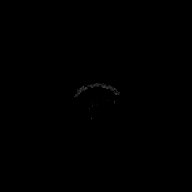
[im 16/32]
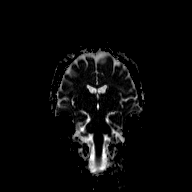
[im 32/32]
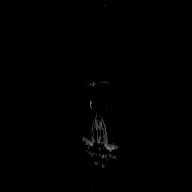

[Series 6: T1 · sagittal · 5.0mm · 0.45mm/px · 2 of 23 slices shown (1 of 2)]
[im 1/23]
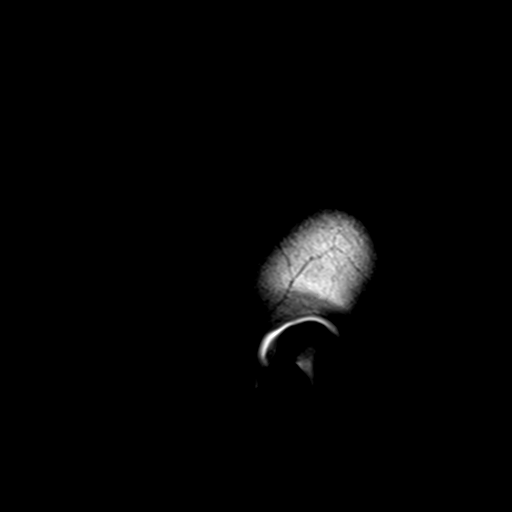
[im 23/23]
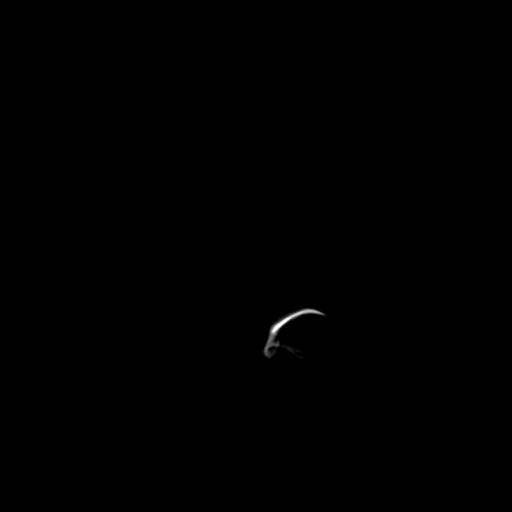

[Series 7: T2 · axial · 5.0mm · 0.72mm/px · z∈[-69,+83]mm · 2 of 23 slices shown (1 of 3)]
[im 1/23]
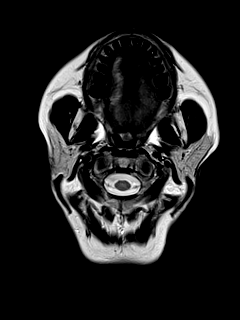
[im 23/23]
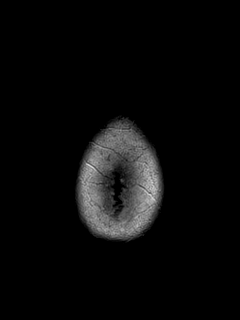

[Series 8: FLAIR · axial · 3.0mm · 0.72mm/px · z∈[-73,+87]mm · 5 of 55 slices shown]
[im 1/55]
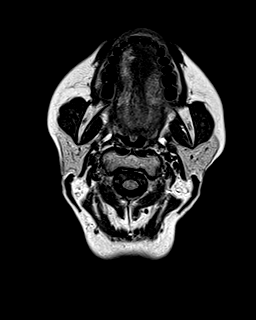
[im 14/55]
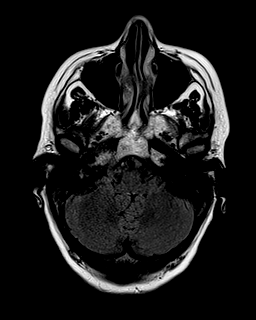
[im 28/55]
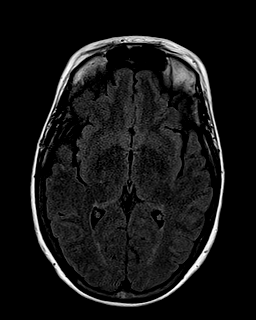
[im 41/55]
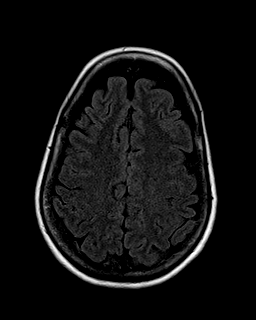
[im 55/55]
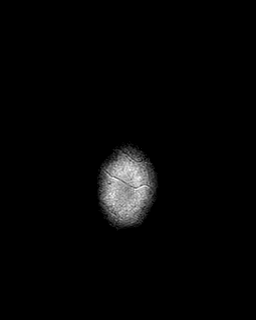

[Series 9: T2 · axial · 5.0mm · 0.72mm/px · z∈[-69,+83]mm · 2 of 23 slices shown (2 of 3)]
[im 1/23]
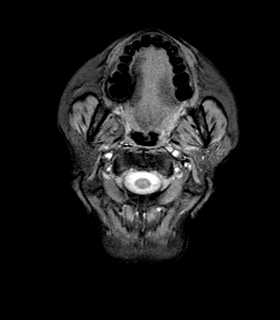
[im 23/23]
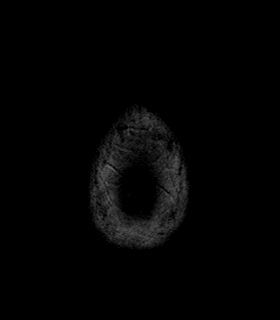

[Series 10: T1 · axial · 1.0mm · 0.90mm/px · z∈[-72,+86]mm · 13 of 160 slices shown (2 of 2)]
[im 1/160]
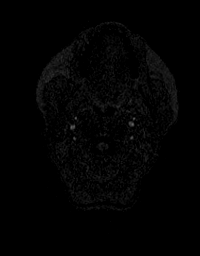
[im 14/160]
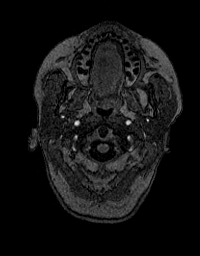
[im 27/160]
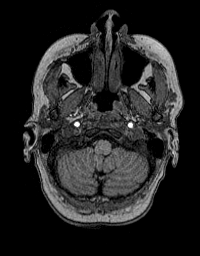
[im 40/160]
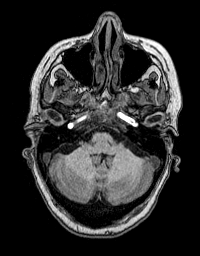
[im 54/160]
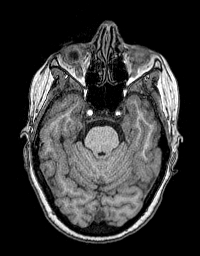
[im 67/160]
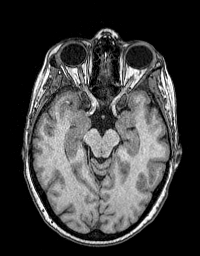
[im 80/160]
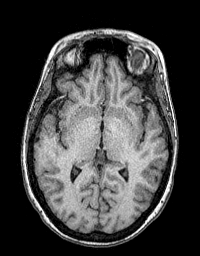
[im 93/160]
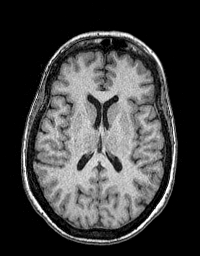
[im 107/160]
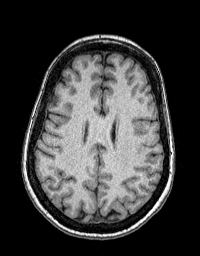
[im 120/160]
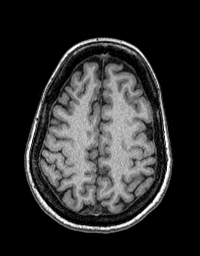
[im 133/160]
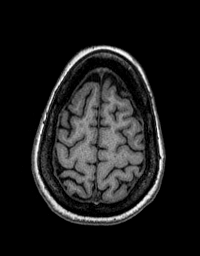
[im 146/160]
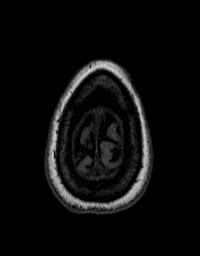
[im 160/160]
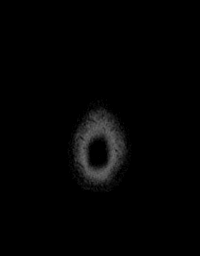

[Series 11: T2 · coronal · 5.0mm · 0.43mm/px · 2 of 29 slices shown (3 of 3)]
[im 1/29]
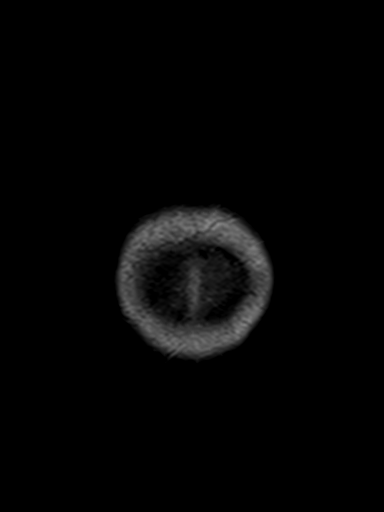
[im 29/29]
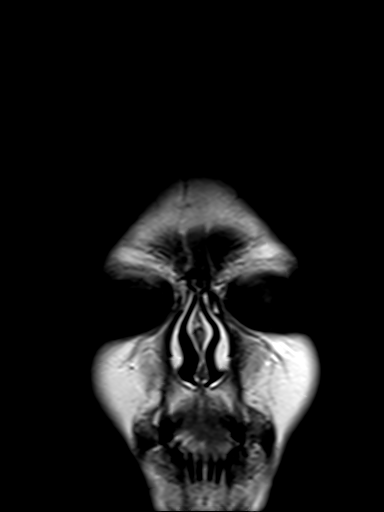

[48 of 48 positions shown; findings below may reference images not displayed]

FINDINGS: Brain:

Cerebral volume is normal.

No cortical encephalomalacia is identified. No significant cerebral
white matter disease.

There is no acute infarct.

No evidence of an intracranial mass.

No chronic intracranial blood products.

No extra-axial fluid collection.

No midline shift.

Vascular: Maintained flow voids within the proximal large arterial
vessels.

Skull and upper cervical spine: No focal suspicious marrow lesion.

Sinuses/Orbits: Visualized orbits show no acute finding. Minimal
mucosal thickening within the right ethmoid air cells.
IMPRESSION: Unremarkable non-contrast MRI appearance of the brain. No evidence
of acute intracranial abnormality.

Minimal right ethmoid sinusitis.

## 2021-08-31 ENCOUNTER — Other Ambulatory Visit: Payer: Self-pay | Admitting: Medical-Surgical

## 2021-08-31 ENCOUNTER — Encounter: Payer: Self-pay | Admitting: Medical-Surgical

## 2021-08-31 NOTE — Telephone Encounter (Signed)
Topiramate or CGRP antagonist?

## 2021-09-01 ENCOUNTER — Encounter: Payer: Self-pay | Admitting: Medical-Surgical

## 2021-09-01 DIAGNOSIS — M170 Bilateral primary osteoarthritis of knee: Secondary | ICD-10-CM

## 2021-09-01 MED ORDER — AJOVY 225 MG/1.5ML ~~LOC~~ SOSY: 225.0000 mg | PREFILLED_SYRINGE | SUBCUTANEOUS | 2 refills | Status: DC

## 2021-09-02 ENCOUNTER — Encounter (HOSPITAL_BASED_OUTPATIENT_CLINIC_OR_DEPARTMENT_OTHER): Payer: Self-pay

## 2021-09-02 ENCOUNTER — Emergency Department (HOSPITAL_BASED_OUTPATIENT_CLINIC_OR_DEPARTMENT_OTHER)
Admission: EM | Admit: 2021-09-02 | Discharge: 2021-09-02 | Disposition: A | Discharge status: Home / Self Care | Payer: BC Managed Care – PPO | Attending: Emergency Medicine | Admitting: Emergency Medicine

## 2021-09-02 ENCOUNTER — Emergency Department (HOSPITAL_BASED_OUTPATIENT_CLINIC_OR_DEPARTMENT_OTHER): Payer: BC Managed Care – PPO

## 2021-09-02 ENCOUNTER — Other Ambulatory Visit: Payer: Self-pay

## 2021-09-02 DIAGNOSIS — M25561 Pain in right knee: Secondary | ICD-10-CM | POA: Insufficient documentation

## 2021-09-02 DIAGNOSIS — Z91040 Latex allergy status: Secondary | ICD-10-CM | POA: Insufficient documentation

## 2021-09-02 DIAGNOSIS — S8991XA Unspecified injury of right lower leg, initial encounter: Secondary | ICD-10-CM | POA: Diagnosis not present

## 2021-09-02 DIAGNOSIS — W07XXXA Fall from chair, initial encounter: Secondary | ICD-10-CM | POA: Diagnosis not present

## 2021-09-02 IMAGING — DX DG KNEE COMPLETE 4+V*R*
4 series · 4 of 4 positions shown · non-contrast
Comparison: [DATE].

CLINICAL DATA: Knee injury, initial encounter.

EXAM:
RIGHT KNEE - COMPLETE 4+ VIEW

[knee ap]
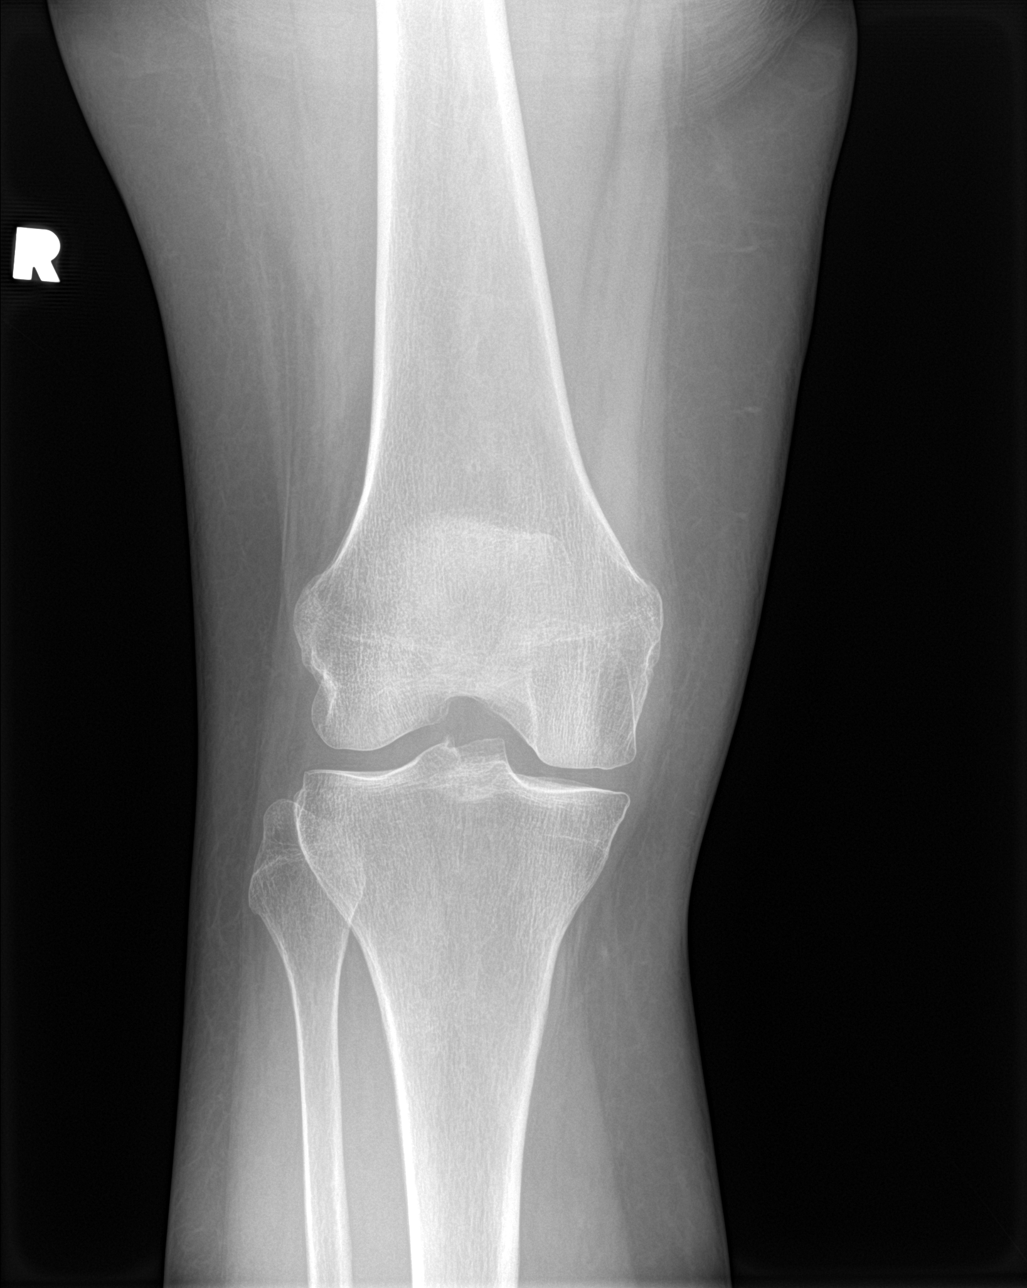

[knee lat]
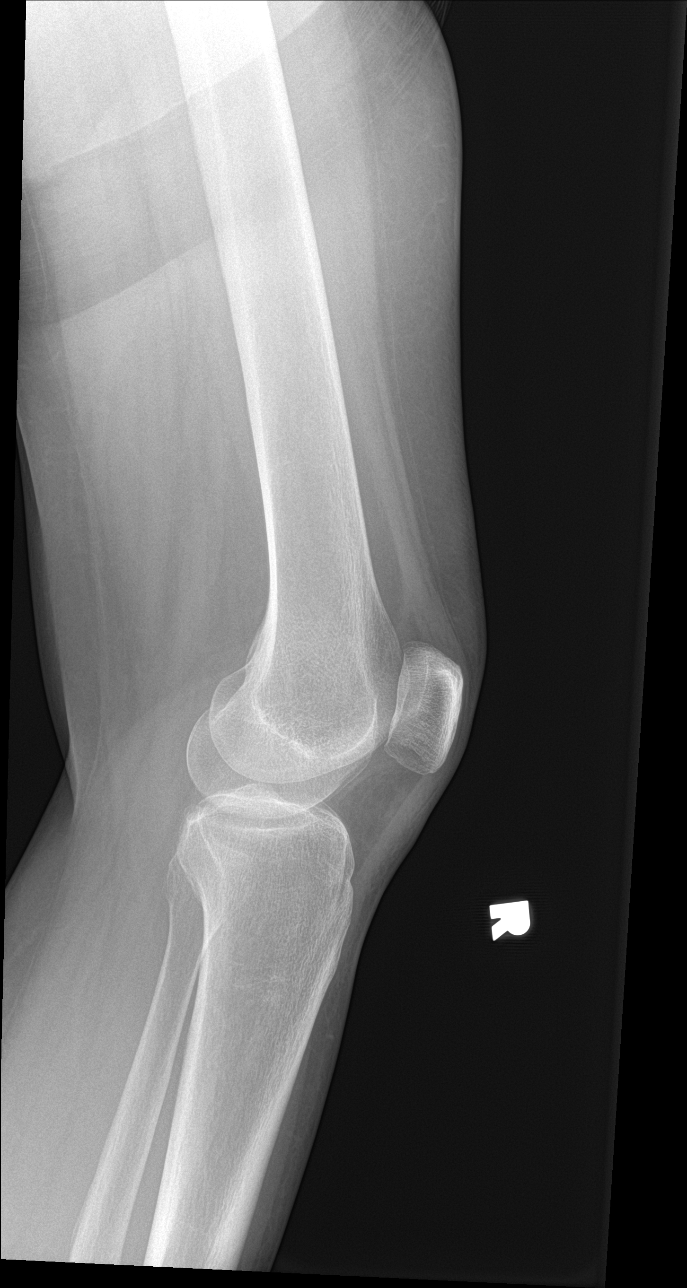

[knee obl (1 of 2)]
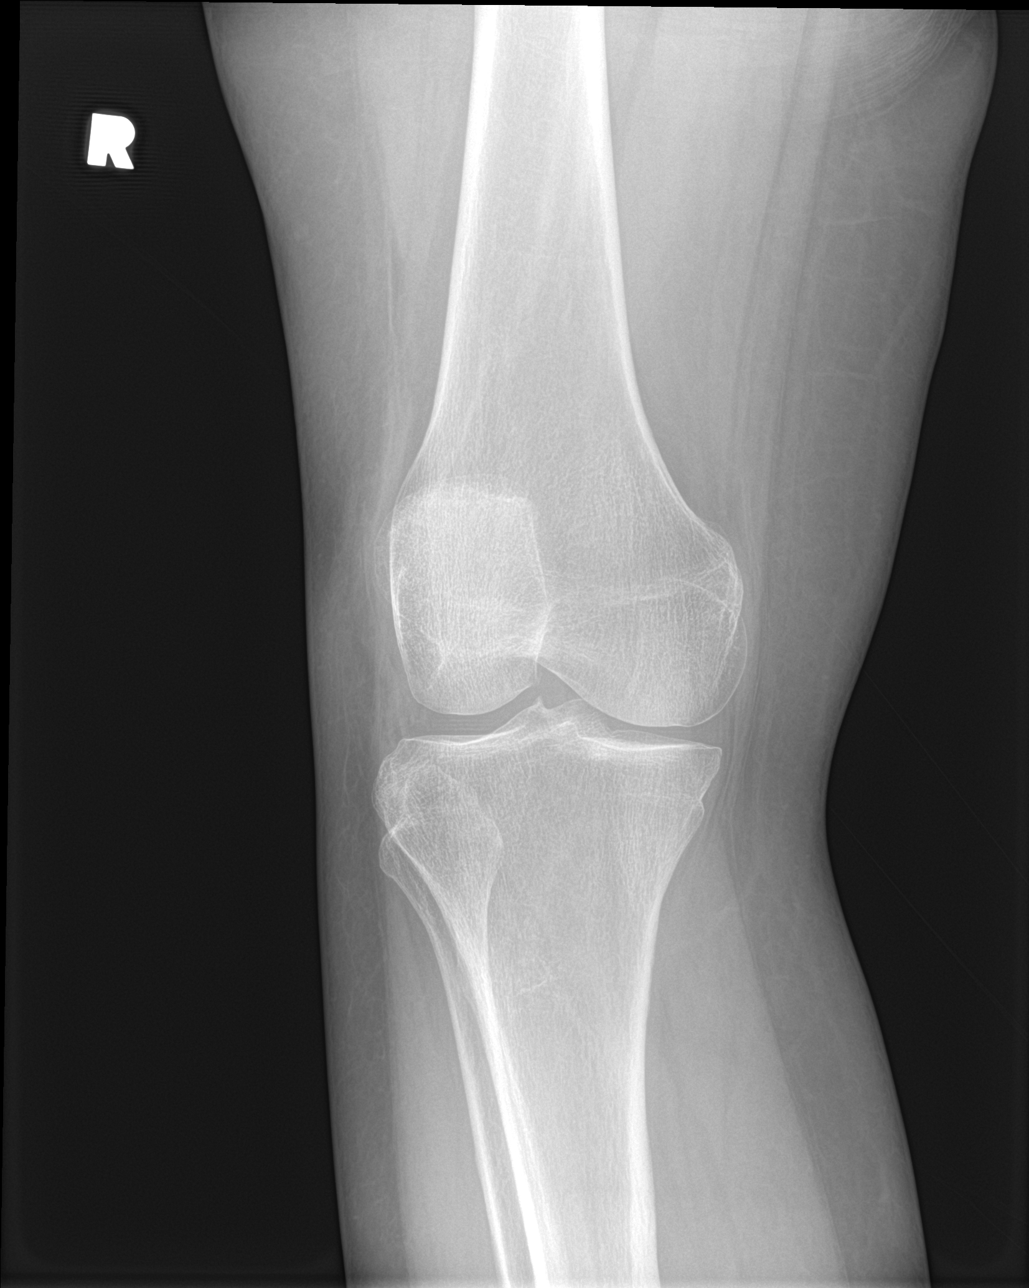

[knee obl (2 of 2)]
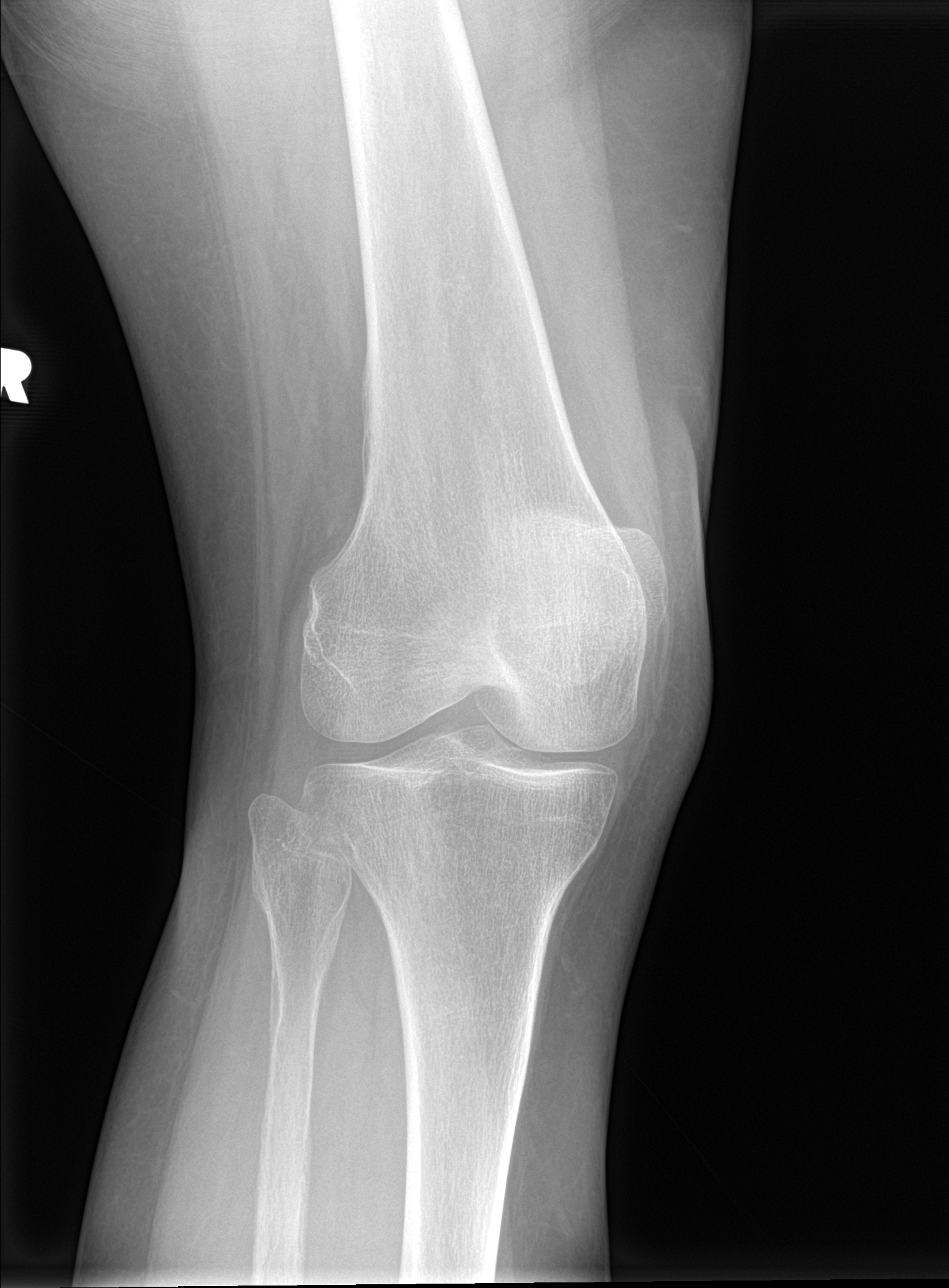

[4 of 4 positions shown; findings below may reference images not displayed]

FINDINGS: No joint effusion or fracture.  No degenerative changes.
IMPRESSION: Negative.

## 2021-09-02 MED ORDER — DICLOFENAC SODIUM 1 % EX GEL: 4.0000 g | Freq: Four times a day (QID) | CUTANEOUS | 0 refills | Status: DC

## 2021-09-02 NOTE — ED Triage Notes (Signed)
Pt states she feel at work ~1130am-pain to right knee-NAD-to triage in w/c

## 2021-09-02 NOTE — ED Provider Notes (Signed)
[facility] EMERGENCY DEPARTMENT Provider Note   CSN: [id number] Arrival date & time: 09/02/21  1317     History  Chief Complaint  Patient presents with   Knee Injury    Rachel Rich is a 51 y.o. female.  51 yo F with a chief complaint of right knee pain.  The patient was at work and she felt like her legs got tangled up on her chair leg and when she tried to get up she fell on her bent knee.  She has pain especially with trying to bear weight and stand.  She denies other injury.  Denies pain to the ankle or the foot.  Has a chronic history of arthritis and sees orthopedics for this.  Actually has an appointment in 2 days time.       Home Medications Prior to Admission medications   Medication Sig Start Date End Date Taking? Authorizing Provider  diclofenac Sodium (VOLTAREN) 1 % GEL Apply 4 g topically 4 (four) times daily. 09/02/21  Yes Deno Etienne, DO  acetaminophen (TYLENOL) 650 MG CR tablet Take 1 tablet (650 mg total) by mouth every 8 (eight) hours as needed for pain. 05/22/19   Silverio Decamp, MD  amoxicillin (AMOXIL) 875 MG tablet Take 1 tablet (875 mg total) by mouth 2 (two) times daily. 08/04/21   Breeback, Jade L, PA-C  ARIPiprazole (ABILIFY) 10 MG tablet TAKE 1 TABLET BY MOUTH EVERY DAY 05/26/21   Samuel Bouche, NP  famotidine (PEPCID) 20 MG tablet Take 20 mg by mouth 2 (two) times daily. 05/17/19   [provider]  Fremanezumab-vfrm (AJOVY) 225 MG/1.5ML SOSY Inject 225 mg into the skin every 30 (thirty) days. 09/01/21   Samuel Bouche, NP  methocarbamol (ROBAXIN) 500 MG tablet TAKE 1 TABLET BY MOUTH EVERY 8 HOURS AS NEEDED FOR MUSCLE SPASMS 07/31/21   Silverio Decamp, MD  pravastatin (PRAVACHOL) 10 MG tablet Take 1 tablet (10 mg total) by mouth daily. 07/08/21   Samuel Bouche, NP  rOPINIRole (REQUIP) 0.25 MG tablet TAKE 1 TABLET BY MOUTH EVERYDAY AT BEDTIME 08/19/21   Samuel Bouche, NP  SUMAtriptan (IMITREX) 50 MG tablet Take 1-2 tablets (50-100 mg  total) by mouth once for 1 dose. May repeat in 2 hours if headache persists. Max daily dose 200 mg 08/27/21 08/27/21  Samuel Bouche, NP  traZODone (DESYREL) 100 MG tablet Take 2 tablets (200 mg total) by mouth at bedtime as needed for sleep. 11/11/20   Emeterio Reeve, DO      Allergies    Codeine, Elemental sulfur, Erythromycin, Glucosamine, Hydrocodone-acetaminophen, Iodinated contrast media, Metrizamide, Morphine, Morphine and related, Nitrofurantoin, Prednisone, Shellfish allergy, Topiramate, Tramadol, Wound dressing adhesive, Cortisone, Demeclocycline, Hydrocodone, Meperidine, Shellfish-derived products, Sulfa antibiotics, Sulfasalazine, Tetracycline, Tetracyclines & related, Macrobid [nitrofurantoin monohyd macro], Tape, Latex, Other, and Sulfamethoxazole-trimethoprim    Review of Systems   Review of Systems  Physical Exam Updated Vital Signs BP 123/87 (BP Location: Left Arm)    Pulse 89    Temp 98.2 F (36.8 C) (Oral)    Resp 18    SpO2 100%  Physical Exam Vitals and nursing note reviewed.  Constitutional:      General: She is not in acute distress.    Appearance: She is well-developed. She is not diaphoretic.  HENT:     Head: Normocephalic and atraumatic.  Eyes:     Pupils: Pupils are equal, round, and reactive to light.  Cardiovascular:     Rate and Rhythm: Normal rate and regular rhythm.  Heart sounds: No murmur heard.   No friction rub. No gallop.  Pulmonary:     Effort: Pulmonary effort is normal.     Breath sounds: No wheezing or rales.  Abdominal:     General: There is no distension.     Palpations: Abdomen is soft.     Tenderness: There is no abdominal tenderness.  Musculoskeletal:        General: Tenderness present.     Cervical back: Normal range of motion and neck supple.     Comments: Some mild erythema at the medial inferior aspect of the knee.  No obvious crepitus.  Full range of motion.  Pain with range of motion.  No obvious ligamentous laxity.  Pulse motor  and sensation intact distally.  No pain at the ankle no pain at the hip.  Skin:    General: Skin is warm and dry.  Neurological:     Mental Status: She is alert and oriented to person, place, and time.  Psychiatric:        Behavior: Behavior normal.    ED Results / Procedures / Treatments   Labs (all labs ordered are listed, but only abnormal results are displayed) Labs Reviewed - No data to display  EKG None  Radiology DG Knee Complete 4 Views Right  Result Date: 09/02/2021 CLINICAL DATA:  Knee injury, initial encounter. EXAM: RIGHT KNEE - COMPLETE 4+ VIEW COMPARISON:  03/19/2019. FINDINGS: No joint effusion or fracture.  No degenerative changes. IMPRESSION: Negative. Electronically Signed   By: Lorin Picket M.D.   On: 09/02/2021 13:47    Procedures Procedures    Medications Ordered in ED Medications - No data to display  ED Course/ Medical Decision Making/ A&P                           Medical Decision Making Amount and/or Complexity of Data Reviewed Radiology: ordered.   Patient is a 51 y.o. female with a cc of R knee pain after a fall.  The patient was at work and she lost her footing trying to stand up from her chair.  Denies any other injury.  Difficult to fully assess ligaments and meniscus due to discomfort.  Plain film independently interpreted by me without fracture.  Placed in knee immobilizer crutches she has planned orthopedic follow-up in 48 hours and plans to discuss it with her provider.  2:03 PM:  I have discussed the diagnosis/risks/treatment options with the patient and friend .  Evaluation and diagnostic testing in the emergency department does not suggest an emergent condition requiring admission or immediate intervention beyond what has been performed at this time.  They will follow up with Ortho. We also discussed returning to the ED immediately if new or worsening sx occur. We discussed the sx which are most concerning (e.g., sudden worsening pain,  fever, inability to tolerate by mouth) that necessitate immediate return. Medications administered to the patient during their visit and any new prescriptions provided to the patient are listed below.  Medications given during this visit Medications - No data to display   The patient appears reasonably screen and/or stabilized for discharge and I doubt any other medical condition or other [facility] requiring further screening, evaluation, or treatment in the ED at this time prior to discharge.         Final Clinical Impression(s) / ED Diagnoses Final diagnoses:  Acute pain of right knee    Rx / DC Orders  ED Discharge Orders          Ordered    diclofenac Sodium (VOLTAREN) 1 % GEL  4 times daily        09/02/21 [place], Ledger Heindl, DO 09/02/21 1403

## 2021-09-02 NOTE — Discharge Instructions (Signed)
You can follow-up with your orthopedic doctor on Friday.  Typically gets seen in about a week by the family doctor.  Try to keep your weight off of it as best you can.  Use the gel as prescribed and then take up to 1000 mg of Tylenol 4 times a day.  You can ice your knee off and on 15 minutes on 15 minutes off for the next couple hours if you choose.

## 2021-09-04 DIAGNOSIS — M25561 Pain in right knee: Secondary | ICD-10-CM | POA: Diagnosis not present

## 2021-09-14 ENCOUNTER — Telehealth: Payer: Self-pay

## 2021-09-14 DIAGNOSIS — Z20822 Contact with and (suspected) exposure to covid-19: Secondary | ICD-10-CM | POA: Diagnosis not present

## 2021-09-14 NOTE — Telephone Encounter (Addendum)
Initiated Prior authorization GAY:GEFUW (fremanezumab-vfrm) injection 225MG /1.5ML syringes Via: Covermymeds Case/Key:[id number] Status: approved  as of 09/14/21 Reason:Effective from 09/14/2021 through 12/06/2021. Notified Pt via: Mychart

## 2021-09-18 ENCOUNTER — Other Ambulatory Visit: Payer: Self-pay | Admitting: Medical-Surgical

## 2021-09-22 DIAGNOSIS — R197 Diarrhea, unspecified: Secondary | ICD-10-CM | POA: Diagnosis not present

## 2021-09-24 ENCOUNTER — Ambulatory Visit: Payer: BC Managed Care – PPO | Admitting: Medical-Surgical

## 2021-09-24 DIAGNOSIS — Z9889 Other specified postprocedural states: Secondary | ICD-10-CM | POA: Diagnosis not present

## 2021-09-24 DIAGNOSIS — R197 Diarrhea, unspecified: Secondary | ICD-10-CM | POA: Diagnosis not present

## 2021-09-27 ENCOUNTER — Other Ambulatory Visit: Payer: Self-pay | Admitting: Medical-Surgical

## 2021-09-27 ENCOUNTER — Other Ambulatory Visit: Payer: Self-pay | Admitting: Sports Medicine

## 2021-09-27 ENCOUNTER — Other Ambulatory Visit: Payer: Self-pay | Admitting: Osteopathic Medicine

## 2021-10-02 ENCOUNTER — Emergency Department (HOSPITAL_COMMUNITY)
Admission: EM | Admit: 2021-10-02 | Discharge: 2021-10-02 | Disposition: A | Discharge status: Home / Self Care | Payer: BC Managed Care – PPO | Attending: Emergency Medicine | Admitting: Emergency Medicine

## 2021-10-02 ENCOUNTER — Other Ambulatory Visit: Payer: Self-pay

## 2021-10-02 ENCOUNTER — Emergency Department (HOSPITAL_COMMUNITY): Payer: BC Managed Care – PPO

## 2021-10-02 DIAGNOSIS — J45909 Unspecified asthma, uncomplicated: Secondary | ICD-10-CM | POA: Diagnosis not present

## 2021-10-02 DIAGNOSIS — G43809 Other migraine, not intractable, without status migrainosus: Secondary | ICD-10-CM | POA: Diagnosis not present

## 2021-10-02 DIAGNOSIS — R42 Dizziness and giddiness: Secondary | ICD-10-CM | POA: Diagnosis not present

## 2021-10-02 DIAGNOSIS — R4020 Unspecified coma: Secondary | ICD-10-CM | POA: Diagnosis not present

## 2021-10-02 DIAGNOSIS — R569 Unspecified convulsions: Secondary | ICD-10-CM | POA: Insufficient documentation

## 2021-10-02 DIAGNOSIS — G43811 Other migraine, intractable, with status migrainosus: Secondary | ICD-10-CM | POA: Diagnosis not present

## 2021-10-02 DIAGNOSIS — Z91040 Latex allergy status: Secondary | ICD-10-CM | POA: Insufficient documentation

## 2021-10-02 DIAGNOSIS — R404 Transient alteration of awareness: Secondary | ICD-10-CM | POA: Diagnosis not present

## 2021-10-02 LAB — CBC WITH DIFFERENTIAL/PLATELET
Abs Immature Granulocytes: 0.01 10*3/uL (ref 0.00–0.07)
Basophils Absolute: 0 10*3/uL (ref 0.0–0.1)
Basophils Relative: 0 %
Eosinophils Absolute: 0 10*3/uL (ref 0.0–0.5)
Eosinophils Relative: 0 %
HCT: 41.8 % (ref 36.0–46.0)
Hemoglobin: 12.5 g/dL (ref 12.0–15.0)
Immature Granulocytes: 0 %
Lymphocytes Relative: 32 %
Lymphs Abs: 0.9 10*3/uL (ref 0.7–4.0)
MCH: 28.7 pg (ref 26.0–34.0)
MCHC: 29.9 g/dL — ABNORMAL LOW (ref 30.0–36.0)
MCV: 96.1 fL (ref 80.0–100.0)
Monocytes Absolute: 0.1 10*3/uL (ref 0.1–1.0)
Monocytes Relative: 5 %
Neutro Abs: 1.8 10*3/uL (ref 1.7–7.7)
Neutrophils Relative %: 63 %
Platelets: UNDETERMINED 10*3/uL (ref 150–400)
RBC: 4.35 MIL/uL (ref 3.87–5.11)
RDW: 12 % (ref 11.5–15.5)
WBC: 2.9 10*3/uL — ABNORMAL LOW (ref 4.0–10.5)
nRBC: 0 % (ref 0.0–0.2)

## 2021-10-02 LAB — COMPREHENSIVE METABOLIC PANEL
ALT: 33 U/L (ref 0–44)
AST: 38 U/L (ref 15–41)
Albumin: 3.2 g/dL — ABNORMAL LOW (ref 3.5–5.0)
Alkaline Phosphatase: 93 U/L (ref 38–126)
Anion gap: 12 (ref 5–15)
BUN: 8 mg/dL (ref 6–20)
CO2: 17 mmol/L — ABNORMAL LOW (ref 22–32)
Calcium: 8 mg/dL — ABNORMAL LOW (ref 8.9–10.3)
Chloride: 108 mmol/L (ref 98–111)
Creatinine, Ser: 0.78 mg/dL (ref 0.44–1.00)
GFR, Estimated: >60 mL/min (ref >60)
Glucose, Bld: 106 mg/dL — ABNORMAL HIGH (ref 70–99)
Potassium: 4.1 mmol/L (ref 3.5–5.1)
Sodium: 137 mmol/L (ref 135–145)
Total Bilirubin: 1.1 mg/dL (ref 0.3–1.2)
Total Protein: 5.1 g/dL — ABNORMAL LOW (ref 6.5–8.1)

## 2021-10-02 LAB — CBG MONITORING, ED: Glucose-Capillary: 93 mg/dL (ref 70–99)

## 2021-10-02 LAB — MAGNESIUM: Magnesium: 2 mg/dL (ref 1.7–2.4)

## 2021-10-02 IMAGING — CT CT HEAD W/O CM
3 of 4 series · 15 of 47 positions shown, 18 images · non-contrast
Comparison: MRI [DATE].

CLINICAL DATA: Seizure, new-onset, no history of trauma



[Series 3: head 5.0 h30s · axial · 0.41mm/px · z∈[+1487,+1622]mm · 9 of 33 slices shown, 12 images]
[im 3/33  brain]
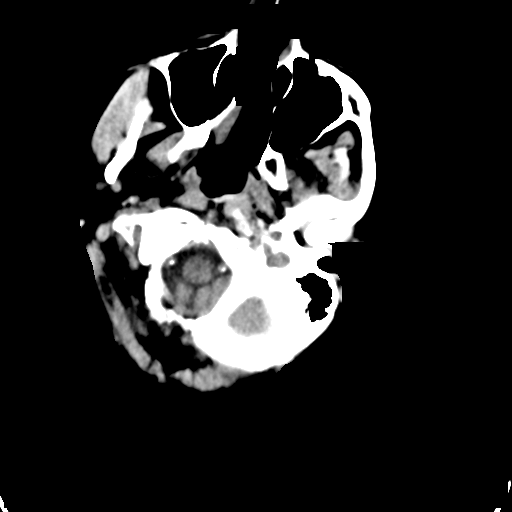
[im 3/33  bone]
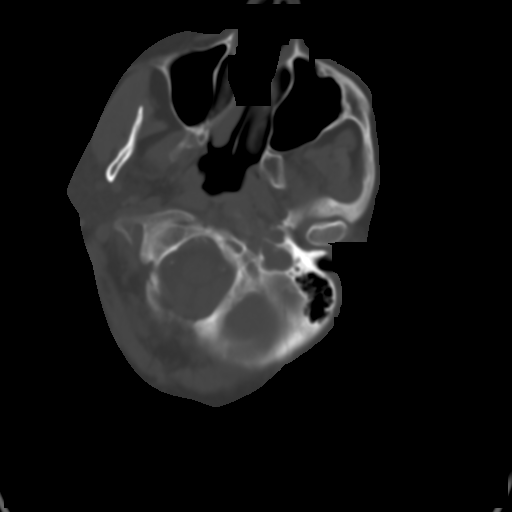
[im 7/33  brain]
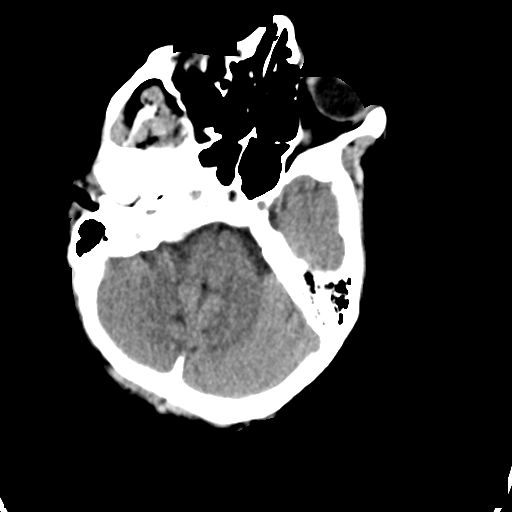
[im 10/33  brain]
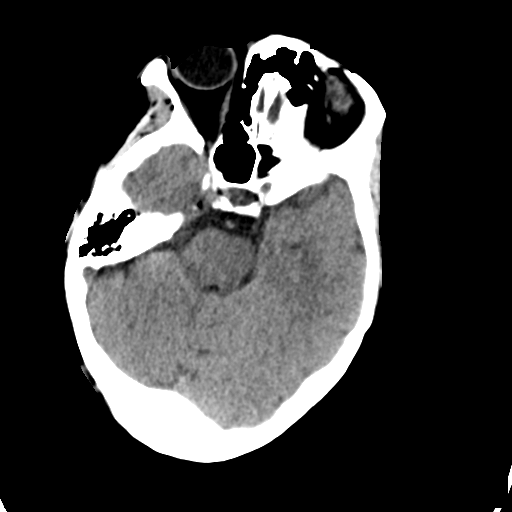
[im 14/33  brain]
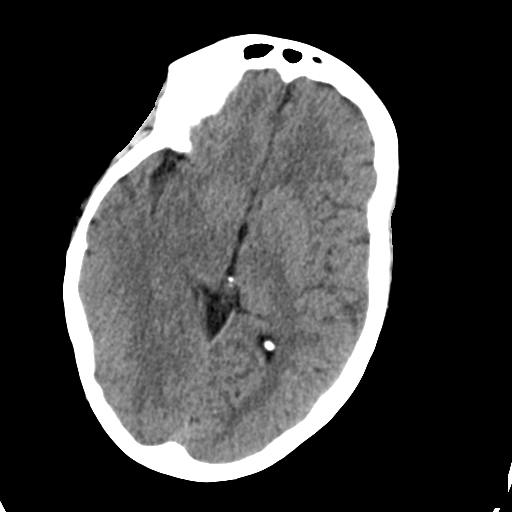
[im 17/33  brain]
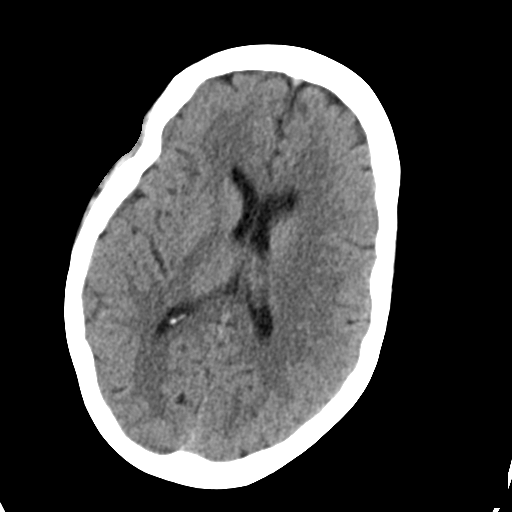
[im 17/33  bone]
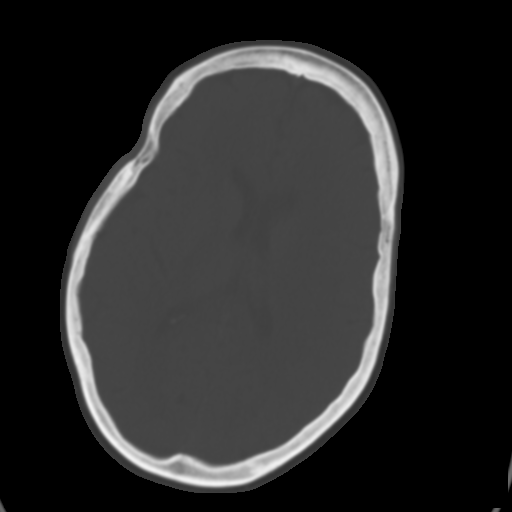
[im 19/33  brain]
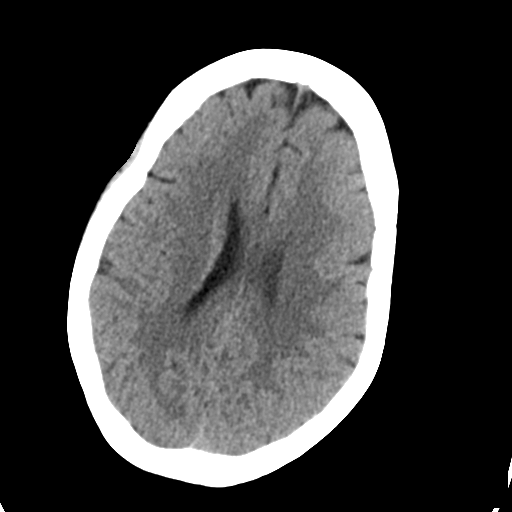
[im 23/33  brain]
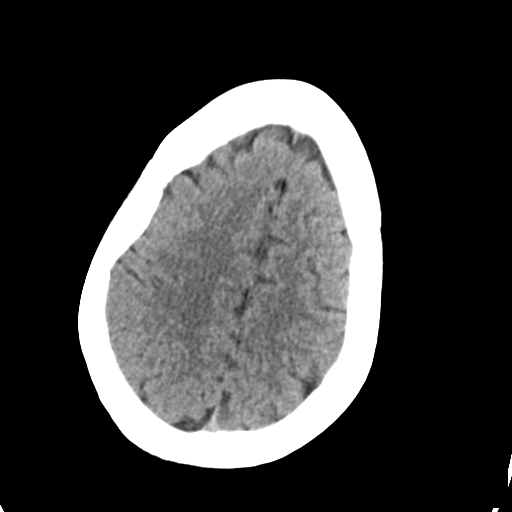
[im 26/33  brain]
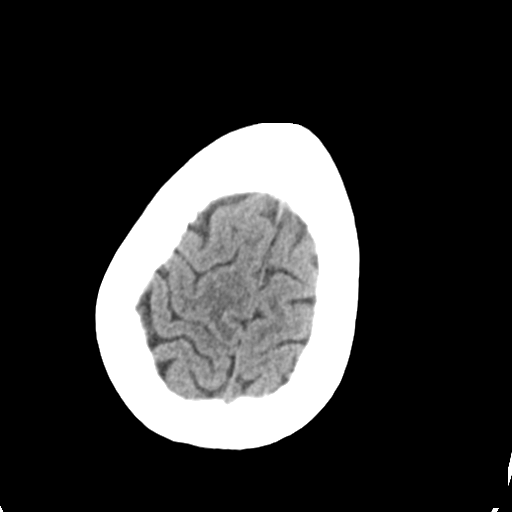
[im 30/33  brain]
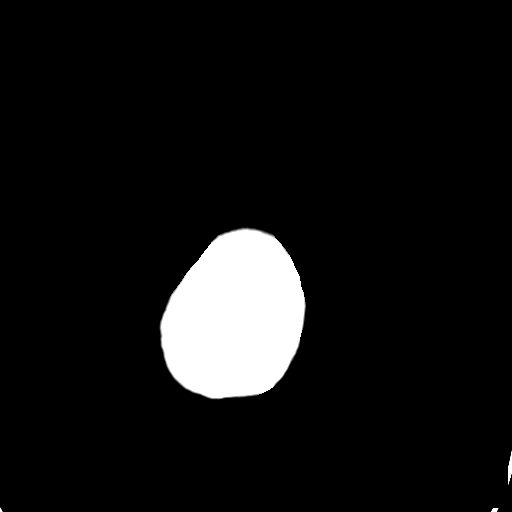
[im 30/33  bone]
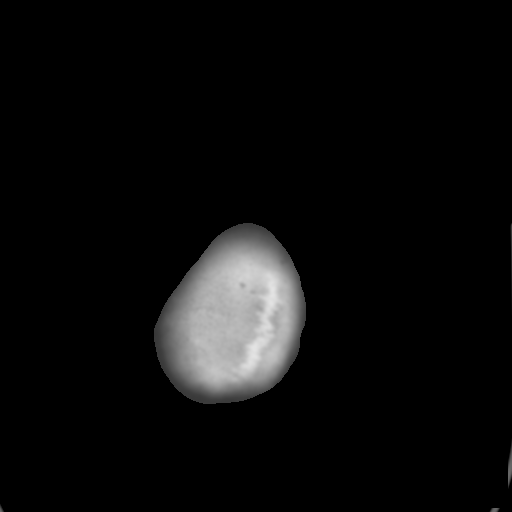

[Series 5: head 3.0 mpr cor · coronal · 0.35mm/px · 3 of 67 slices shown]
[im 23/67  brain]
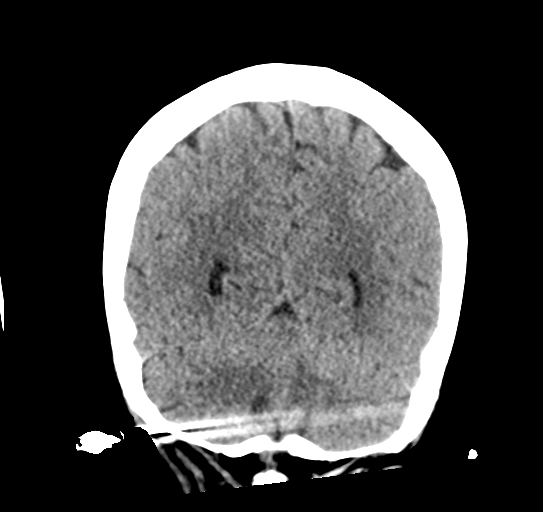
[im 30/67  brain]
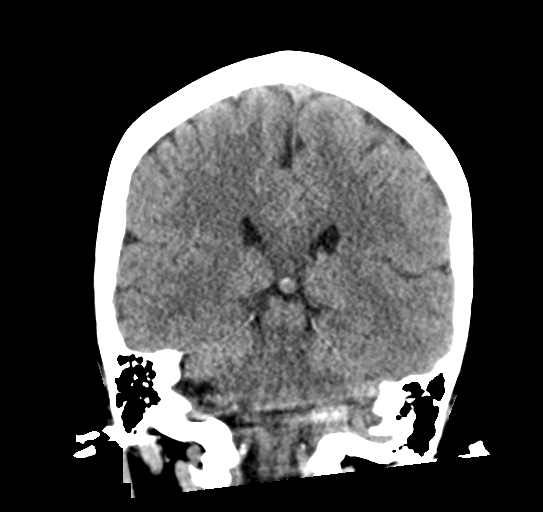
[im 37/67  brain]
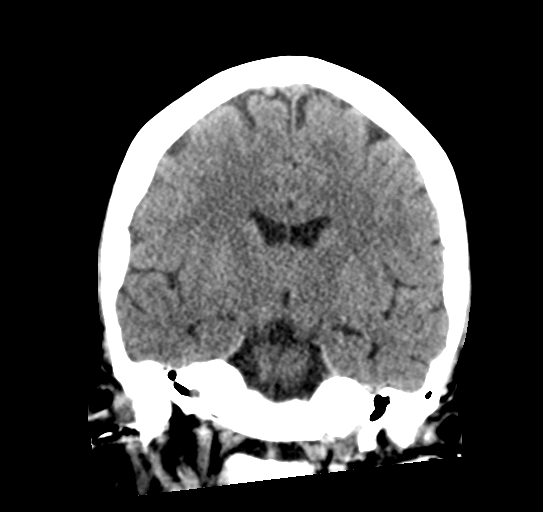

[Series 6: head 3.0 mpr sag · sagittal · 0.31mm/px · 3 of 59 slices shown]
[im 23/59  brain]
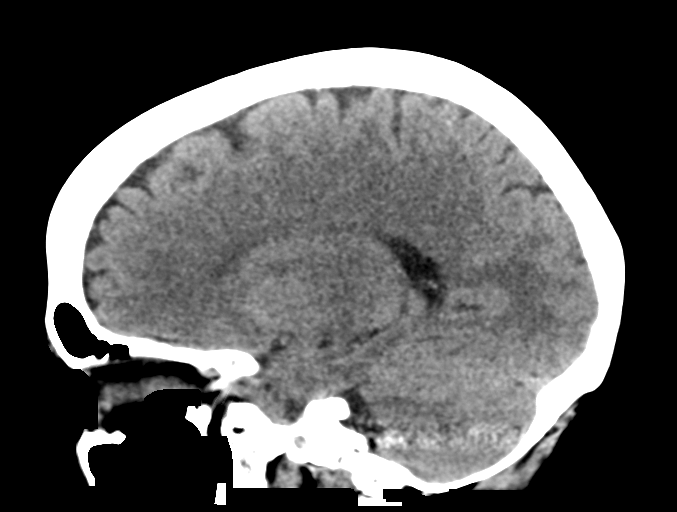
[im 29/59  brain]
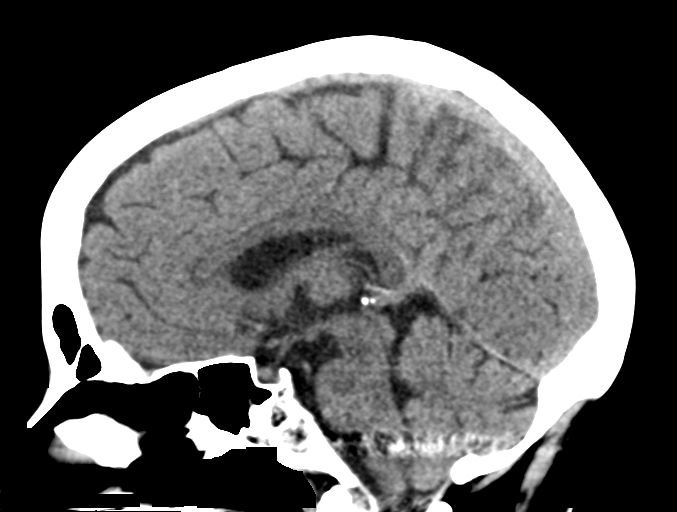
[im 36/59  brain]
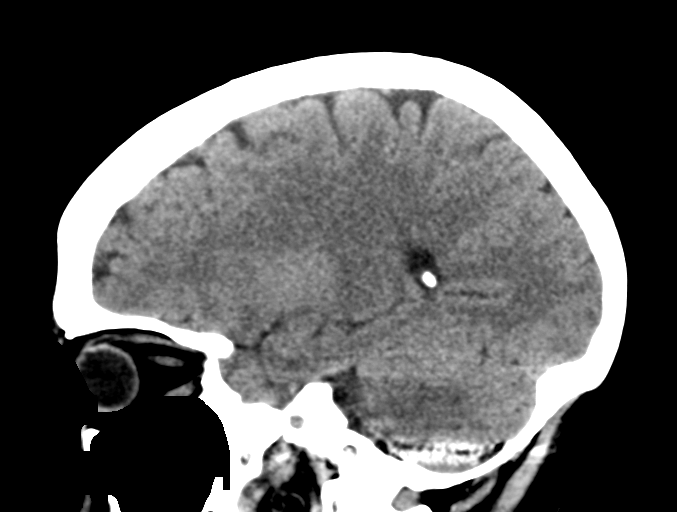

[15 of 47 positions shown; findings below may reference images not displayed]

FINDINGS: Brain: No evidence of acute infarction, hemorrhage, hydrocephalus,
extra-axial collection or mass lesion/mass effect. Streak artifact
limits evaluation of the posterior fossa.

Vascular: No hyperdense vessel identified. Calcific intracranial
atherosclerosis.

Skull: No acute fracture.

Sinuses/Orbits: Visualized sinuses are clear. No acute orbital
findings.

Other: No mastoid effusions.
IMPRESSION: No evidence of acute intracranial abnormality.

## 2021-10-02 MED ORDER — LACTATED RINGERS IV BOLUS
1000.0000 mL | Freq: Once | INTRAVENOUS | Status: AC
  Administered 2021-10-02: 1000 mL via INTRAVENOUS

## 2021-10-02 MED ORDER — ACETAMINOPHEN 500 MG PO TABS
1000.0000 mg | ORAL_TABLET | Freq: Once | ORAL | Status: DC
  Filled 2021-10-02: qty 2

## 2021-10-02 MED ORDER — METOCLOPRAMIDE HCL 5 MG/ML IJ SOLN
10.0000 mg | Freq: Once | INTRAMUSCULAR | Status: AC
  Administered 2021-10-02: 10 mg via INTRAVENOUS
  Filled 2021-10-02: qty 2

## 2021-10-02 NOTE — Discharge Instructions (Signed)
Continue to do the Gatorade 0 like you have been doing and the Compazine at night.  Follow-up with your GI doctor in the upper GI later this month.  Definitely mention to them you have been having some palpitations you may need to wear a monitor in the future and have that investigated.  All your tests today look very reassuring.  Your electrolytes are normal.  Your CAT scan was normal. ?

## 2021-10-02 NOTE — ED Provider Notes (Signed)
[facility] EMERGENCY DEPARTMENT Provider Note   CSN: [id number] Arrival date & time: 10/02/21  1707     History  Chief Complaint  Patient presents with   Seizures    Rachel Rich is a 51 y.o. female.  Pt is a 51y/o female with pmhx of severe protein energy malnutrition due to possible dumping syndrome from prior gastric bypass and surgeries,  migraine/headaches, asthma and bipolar disorder who is presenting to the emergency room today with EMS after having 2 seizures at work.  They report patient was sitting in a chair when she had the initial seizure.  Unclear exactly what the seizure looked like.  When EMS got there she was groggy but answering questions and then EMS reported she had another seizure.  Unfortunately EMS is no longer here and when they gave the report to the nurse they did not describe what the seizure look like but they did give the patient 2.5 mg of Versed.  They noted they felt like they saw a left-sided facial droop with the patient.  On exam patient is awake and did report that she had a headache and did indicate that she had a headache before at work before the seizure started.  Unclear if patient has had prior history of seizures.  EMS said no but when asking the patient she said yes.  Patient is slightly limited in history taking right now is her teeth she is holding very tightly together and only talking with her teeth closed.  The history is provided by the patient, the EMS personnel and medical records.  Seizures     Home Medications Prior to Admission medications   Medication Sig Start Date End Date Taking? Authorizing Provider  acetaminophen (TYLENOL) 650 MG CR tablet Take 1 tablet (650 mg total) by mouth every 8 (eight) hours as needed for pain. 05/22/19   Silverio Decamp, MD  amoxicillin (AMOXIL) 875 MG tablet Take 1 tablet (875 mg total) by mouth 2 (two) times daily. 08/04/21   Breeback, Jade L, PA-C  ARIPiprazole (ABILIFY) 10  MG tablet TAKE 1 TABLET BY MOUTH EVERY DAY 05/26/21   Samuel Bouche, NP  diclofenac Sodium (VOLTAREN) 1 % GEL Apply 4 g topically 4 (four) times daily. 09/02/21   Deno Etienne, DO  famotidine (PEPCID) 20 MG tablet Take 20 mg by mouth 2 (two) times daily. 05/17/19   [provider]  Fremanezumab-vfrm (AJOVY) 225 MG/1.5ML SOSY Inject 225 mg into the skin every 30 (thirty) days. 09/01/21   Samuel Bouche, NP  methocarbamol (ROBAXIN) 500 MG tablet TAKE 1 TABLET BY MOUTH EVERY 8 HOURS AS NEEDED FOR MUSCLE SPASMS 07/31/21   Silverio Decamp, MD  pravastatin (PRAVACHOL) 10 MG tablet Take 1 tablet (10 mg total) by mouth daily. NEEDS APPOINTMENT FOR FURTHER REFILLS. 09/30/21   Samuel Bouche, NP  rOPINIRole (REQUIP) 0.25 MG tablet TAKE 1 TABLET BY MOUTH EVERYDAY AT BEDTIME 09/18/21   Samuel Bouche, NP  SUMAtriptan (IMITREX) 50 MG tablet Take 1-2 tablets (50-100 mg total) by mouth once for 1 dose. May repeat in 2 hours if headache persists. Max daily dose 200 mg 08/27/21 08/27/21  Samuel Bouche, NP  traZODone (DESYREL) 100 MG tablet TAKE 2 TABLETS (200 MG TOTAL) BY MOUTH AT BEDTIME AS NEEDED FOR SLEEP. 09/30/21   Samuel Bouche, NP      Allergies    Codeine, Elemental sulfur, Erythromycin, Glucosamine, Hydrocodone-acetaminophen, Iodinated contrast media, Metrizamide, Morphine, Morphine and related, Nitrofurantoin, Prednisone, Shellfish allergy, Topiramate, Tramadol, Wound  dressing adhesive, Cortisone, Demeclocycline, Hydrocodone, Meperidine, Shellfish-derived products, Sulfa antibiotics, Sulfasalazine, Tetracycline, Tetracyclines & related, Macrobid [nitrofurantoin monohyd macro], Tape, Latex, Other, and Sulfamethoxazole-trimethoprim    Review of Systems   Review of Systems  Neurological:  Positive for seizures.   Physical Exam Updated Vital Signs BP 116/68    Pulse 61    Temp 98.8 F (37.1 C) (Oral)    Resp 14    SpO2 100%  Physical Exam Vitals and nursing note reviewed.  Constitutional:      General: She is not in  acute distress.    Appearance: She is well-developed.  HENT:     Head: Normocephalic and atraumatic.  Eyes:     Pupils: Pupils are equal, round, and reactive to light.  Cardiovascular:     Rate and Rhythm: Normal rate and regular rhythm.     Heart sounds: Normal heart sounds. No murmur heard.   No friction rub.  Pulmonary:     Effort: Pulmonary effort is normal.     Breath sounds: Normal breath sounds. No wheezing or rales.  Abdominal:     General: Bowel sounds are normal. There is no distension.     Palpations: Abdomen is soft.     Tenderness: There is no abdominal tenderness. There is no guarding or rebound.  Musculoskeletal:        General: No tenderness. Normal range of motion.     Comments: No edema  Skin:    General: Skin is warm and dry.     Coloration: Skin is pale.     Findings: No rash.  Neurological:     Cranial Nerves: No cranial nerve deficit.     Comments: Slight droop to the left side of the mouth but patient will hold up the left arm some but flaccid in the left leg.  She will hold up her right arm but then drops her right leg.  Sensation seems to be intact.  Patient is quivering in general in all 4 extremities but no tonic-clonic movements.  She is talking with her teeth clenched together so it is difficult to tell if there is under any slurred speech.  She did receive medication prior to arrival and does appear somewhat sleepy  Psychiatric:     Comments: Sleepy but calm    ED Results / Procedures / Treatments   Labs (all labs ordered are listed, but only abnormal results are displayed) Labs Reviewed  CBC WITH DIFFERENTIAL/PLATELET - Abnormal; Notable for the following components:      Result Value   WBC 2.9 (*)    MCHC 29.9 (*)    All other components within normal limits  COMPREHENSIVE METABOLIC PANEL - Abnormal; Notable for the following components:   CO2 17 (*)    Glucose, Bld 106 (*)    Calcium 8.0 (*)    Total Protein 5.1 (*)    Albumin 3.2 (*)     All other components within normal limits  MAGNESIUM  CBG MONITORING, ED    EKG EKG Interpretation  Date/Time:  Friday October 02 2021 17:11:40 EST Ventricular Rate:  88 PR Interval:    QRS Duration: 99 QT Interval:  381 QTC Calculation: 461 R Axis:   44 Text Interpretation: Sinus rhythm Anteroseptal infarct, age indeterminate Artifact in lead(s) I II aVR aVL aVF V1 V2 V3 V4 V5 No previous tracing Confirmed by Blanchie Dessert [phone]) on 10/02/2021 5:40:52 PM  Radiology CT Head Wo Contrast  Result Date: 10/02/2021 CLINICAL DATA:  Seizure, new-onset,  no history of trauma EXAM: CT HEAD WITHOUT CONTRAST TECHNIQUE: Contiguous axial images were obtained from the base of the skull through the vertex without intravenous contrast. RADIATION DOSE REDUCTION: This exam was performed according to the departmental dose-optimization program which includes automated exposure control, adjustment of the mA and/or kV according to patient size and/or use of iterative reconstruction technique. COMPARISON:  MRI August 30, 2021. FINDINGS: Brain: No evidence of acute infarction, hemorrhage, hydrocephalus, extra-axial collection or mass lesion/mass effect. Streak artifact limits evaluation of the posterior fossa. Vascular: No hyperdense vessel identified. Calcific intracranial atherosclerosis. Skull: No acute fracture. Sinuses/Orbits: Visualized sinuses are clear. No acute orbital findings. Other: No mastoid effusions. IMPRESSION: No evidence of acute intracranial abnormality. Electronically Signed   By: Margaretha Sheffield M.D.   On: 10/02/2021 18:22    Procedures Procedures    Medications Ordered in ED Medications  acetaminophen (TYLENOL) tablet 1,000 mg (1,000 mg Oral Patient Refused/Not Given 10/02/21 1854)  lactated ringers bolus 1,000 mL (1,000 mLs Intravenous New Bag/Given 10/02/21 1741)  metoCLOPramide (REGLAN) injection 10 mg (10 mg Intravenous Given 10/02/21 1854)    ED Course/ Medical Decision Making/  A&P                           Medical Decision Making Amount and/or Complexity of Data Reviewed Independent Historian: EMS External Data Reviewed: notes. Labs: ordered. Decision-making details documented in ED Course. Radiology: ordered and independent interpretation performed. Decision-making details documented in ED Course. ECG/medicine tests: ordered and independent interpretation performed. Decision-making details documented in ED Course.  Risk OTC drugs. Prescription drug management.   51 year old female with multiple medical problems presenting today with possible new onset seizures while at work.  Unfortunately nobody is here who can describe what the seizures look like but patient is not currently having seizures here.  She is not saying a whole lot but her neurologic exam is not consistent with an organic cause.  She has flaccid paralysis in the left leg but has some strength in the left arm.  She also has notable weakness in the right leg and a little more strength in the right arm but still has a pronator drift.  Slight droop of the left side of the mouth but she is clenching her teeth together.  She does indicate that she has a headache and has a history of migraines.  Possible atypical migraine versus she has a history of dumping syndrome or at least frequent diarrhea and concern for possible electrolyte abnormality.  No prior history of drug use.  Labs and imaging are pending.  Patient has 29 allergies but appears to not have allergy to Compazine or Reglan we will try that for her headache.  The trembling in her arms and legs while she is lying in the bed at this time does not appear to be seizure-like activity.  7:35 PM I independently visualized and interpreted patient's head CT that shows no evidence of intracranial bleed.  Radiology reported as normal.  I independently interpreted patient's EKG and labs.  The EKG shows significant artifact but otherwise sinus rhythm without old  to compare.  CBC with minimal leukopenia with a white count of 2.9 but otherwise normal CMP without acute findings, magnesium within normal limits and blood sugar within normal limits.  On repeat evaluation patient is now awake alert and speaking.  She did have some Reglan for the headache she was experiencing.  She has no focal deficits at this  time.  She reports she has been under a lot of stress recently and still continues to vomit daily with her gastric bypass.  She is working with her GI doctors and her surgeon to try to correct this issue.  She has been drinking Gatorade 0 but reports today at work she had an episode of vomiting and then felt palpitations and dizzy and reported then she just seemed to go out and does not remember anything until waking up and that is when she noticed she had the headache.  She does report having conversion disorder like symptoms in the past with elevated stress and not feeling well but is never been to this extent.  Discussed all the findings with the patient and her husband who is at bedside.  Does not appear to have any findings requiring admission at this time.  Patient would like to go home.  She has close outpatient follow-up.  No social determinants affecting her discharge today.        Final Clinical Impression(s) / ED Diagnoses Final diagnoses:  Other migraine with status migrainosus, intractable  Seizure-like activity ([place])    Rx / DC Orders ED Discharge Orders     None         Blanchie Dessert, MD 10/02/21 [phone]

## 2021-10-02 NOTE — ED Triage Notes (Signed)
Pt bib [facility] for witnessed seizure at work lasting 1-2 minutes. No fall, no head trauma. Pt had another witnessed seizure with ems lasting 2 minutes - received 2.5 versed PTA. Pt presents with left facial droop that ems reports was present on arrival. Pt responding and answering questions after 1st seizure.  ? ? Bp 119/61, HR 82, Spo2: 100% 4L ?

## 2021-10-05 ENCOUNTER — Encounter: Payer: Self-pay | Admitting: Medical-Surgical

## 2021-10-08 DIAGNOSIS — R197 Diarrhea, unspecified: Secondary | ICD-10-CM | POA: Diagnosis not present

## 2021-10-08 DIAGNOSIS — R110 Nausea: Secondary | ICD-10-CM | POA: Diagnosis not present

## 2021-10-08 DIAGNOSIS — Z9889 Other specified postprocedural states: Secondary | ICD-10-CM | POA: Diagnosis not present

## 2021-10-08 DIAGNOSIS — Z9884 Bariatric surgery status: Secondary | ICD-10-CM | POA: Diagnosis not present

## 2021-10-15 DIAGNOSIS — R197 Diarrhea, unspecified: Secondary | ICD-10-CM | POA: Diagnosis not present

## 2021-10-15 DIAGNOSIS — R110 Nausea: Secondary | ICD-10-CM | POA: Diagnosis not present

## 2021-10-15 DIAGNOSIS — Z9884 Bariatric surgery status: Secondary | ICD-10-CM | POA: Diagnosis not present

## 2021-10-15 DIAGNOSIS — Z9889 Other specified postprocedural states: Secondary | ICD-10-CM | POA: Diagnosis not present

## 2021-10-21 DIAGNOSIS — F319 Bipolar disorder, unspecified: Secondary | ICD-10-CM | POA: Diagnosis not present

## 2021-10-21 DIAGNOSIS — R002 Palpitations: Secondary | ICD-10-CM | POA: Diagnosis not present

## 2021-10-21 DIAGNOSIS — R55 Syncope and collapse: Secondary | ICD-10-CM | POA: Diagnosis not present

## 2021-10-21 DIAGNOSIS — E785 Hyperlipidemia, unspecified: Secondary | ICD-10-CM | POA: Diagnosis not present

## 2021-10-28 ENCOUNTER — Ambulatory Visit: Payer: BC Managed Care – PPO | Admitting: Medical-Surgical

## 2021-11-04 ENCOUNTER — Encounter: Payer: Self-pay | Admitting: Medical-Surgical

## 2021-11-04 ENCOUNTER — Ambulatory Visit: Payer: BC Managed Care – PPO | Admitting: Medical-Surgical

## 2021-11-04 VITALS — BP 103/68 | HR 83 | Resp 20 | Ht 60.0 in | Wt 123.1 lb

## 2021-11-04 DIAGNOSIS — R1310 Dysphagia, unspecified: Secondary | ICD-10-CM

## 2021-11-04 DIAGNOSIS — E782 Mixed hyperlipidemia: Secondary | ICD-10-CM

## 2021-11-04 DIAGNOSIS — G2581 Restless legs syndrome: Secondary | ICD-10-CM | POA: Diagnosis not present

## 2021-11-04 DIAGNOSIS — F3175 Bipolar disorder, in partial remission, most recent episode depressed: Secondary | ICD-10-CM

## 2021-11-04 DIAGNOSIS — F5101 Primary insomnia: Secondary | ICD-10-CM

## 2021-11-04 DIAGNOSIS — G43811 Other migraine, intractable, with status migrainosus: Secondary | ICD-10-CM

## 2021-11-04 MED ORDER — ARIPIPRAZOLE 10 MG PO TABS: 10.0000 mg | ORAL_TABLET | Freq: Every day | ORAL | 3 refills | Status: DC

## 2021-11-04 MED ORDER — ROPINIROLE HCL 0.5 MG PO TABS: 0.2500 mg | ORAL_TABLET | Freq: Every day | ORAL | 1 refills | Status: DC

## 2021-11-04 MED ORDER — SUMATRIPTAN SUCCINATE 50 MG PO TABS: 50.0000 mg | ORAL_TABLET | Freq: Once | ORAL | 4 refills | Status: DC

## 2021-11-04 MED ORDER — METHOCARBAMOL 500 MG PO TABS: 500.0000 mg | ORAL_TABLET | Freq: Three times a day (TID) | ORAL | 0 refills | Status: DC | PRN

## 2021-11-04 MED ORDER — PRAVASTATIN SODIUM 10 MG PO TABS: 10.0000 mg | ORAL_TABLET | Freq: Every day | ORAL | 0 refills | Status: DC

## 2021-11-04 MED ORDER — AJOVY 225 MG/1.5ML ~~LOC~~ SOSY: 225.0000 mg | PREFILLED_SYRINGE | SUBCUTANEOUS | 2 refills | Status: DC

## 2021-11-04 MED ORDER — TRAZODONE HCL 100 MG PO TABS: 200.0000 mg | ORAL_TABLET | Freq: Every evening | ORAL | 0 refills | Status: DC | PRN

## 2021-11-04 NOTE — Progress Notes (Signed)
?  HPI with pertinent ROS:  ? ?CC: general follow up ? ?HPI: ?Pleasant 51 year old female presenting today for the following: ? ?Dysphagia: has seen bariatrics for this and they are working to manage her symptoms.  She notes this is a type of stasis that can happen after gastric bypass.  She is currently working to get prior authorization for cholestyramine to see if this helps with dumping syndrome type symptoms. ? ?Migraines-started Ajovy and is doing very well on this.  The day of her injection, she feels that her brain is "hot" and this lasts all day however it resolves the next day.  She has not had any migraine headaches since starting the injection although she does have sumatriptan to use as needed.  She has not had to use this at all. ? ?Hyperlipidemia-taking pravastatin as prescribed, tolerating well without side effects.  Recently had blood work but that they did not check a lipid panel.  She is willing to have this checked but would like this ordered through [facility]. ? ?Restless leg syndrome-taking ropinirole 0.25 mg nightly but notes this is not working well for her.  She is interested in increasing the dose if possible.  She also uses methocarbamol 500 mg at night which seems to help the ropinirole taken a little better.  Requesting refills on both. ? ?Bipolar disorder-taking Abilify 10 mg daily, tolerating well without side effects.  Feels the medication is working well for her and is requesting a refill. ? ?Insomnia-taking trazodone 200 mg nightly, tolerating well without side effects.  The medication is working well for her and helping her with sleep.  Requesting refill today. ? ?I reviewed the past medical history, family history, social history, surgical history, and allergies today and no changes were needed.  Please see the problem list section below in epic for further details. ? ? ?Physical exam:  ? ?General: Well Developed, well nourished, and in no acute distress.  ?Neuro: Alert and oriented  x3.  ?HEENT: Normocephalic, atraumatic.  ?Skin: Warm and dry. ?Cardiac: Regular rate and rhythm, no murmurs rubs or gallops, no lower extremity edema.  ?Respiratory: Clear to auscultation bilaterally. Not using accessory muscles, speaking in full sentences. ? ?Impression and Recommendations:   ? ?1. Dysphagia, unspecified type ?Managed by bariatrics.  ? ?2. Other migraine with status migrainosus, intractable ?Stable. Continue Ajovy. Refilling prn Imitrex.  ?- SUMAtriptan (IMITREX) 50 MG tablet; Take 1-2 tablets (50-100 mg total) by mouth once for 1 dose. May repeat in 2 hours if headache persists. Max daily dose 200 mg  Dispense: 10 tablet; Refill: 4 ? ?3. Mixed hyperlipidemia ?Checking lipids. Continue Pravastatin '10mg'$  daily.  ?- Lipid panel ? ?4. RLS (restless legs syndrome) ?Increasing Ropinirole to 0.'5mg'$  nightly. Ok to continue Methocarbamol '500mg'$  nightly prn.  ? ?5. Bipolar disorder, in partial remission, most recent episode depressed ([place]) ?Stable. Continue Abilify '10mg'$  daily.  ? ?6. Primary insomnia ?Stable. Continue Trazodone '200mg'$  nightly.  ? ?Return for annual physical exam at your convenience. ?___________________________________________ ?Clearnce Sorrel, DNP, APRN, FNP-BC ?Primary Care and Sports Medicine ?[place] ?

## 2021-12-01 ENCOUNTER — Other Ambulatory Visit: Payer: Self-pay | Admitting: Medical-Surgical

## 2021-12-24 ENCOUNTER — Encounter: Payer: Self-pay | Admitting: Medical-Surgical

## 2021-12-24 MED ORDER — ROPINIROLE HCL 1 MG PO TABS
1.0000 mg | ORAL_TABLET | Freq: Every day | ORAL | 1 refills | Status: DC
Start: 2021-12-24 — End: 2022-05-25

## 2021-12-31 ENCOUNTER — Encounter: Payer: BC Managed Care – PPO | Admitting: Medical-Surgical

## 2022-01-21 DIAGNOSIS — Z9884 Bariatric surgery status: Secondary | ICD-10-CM | POA: Diagnosis not present

## 2022-02-20 ENCOUNTER — Other Ambulatory Visit: Payer: Self-pay | Admitting: Medical-Surgical

## 2022-03-21 ENCOUNTER — Telehealth: Payer: Self-pay | Admitting: Medical-Surgical

## 2022-03-26 NOTE — Telephone Encounter (Signed)
Patient needs an appointment for further refills.  30 day supply sent. No refills.  Last office visit 11/04/2021  No future appointment.

## 2022-03-30 NOTE — Telephone Encounter (Signed)
Left patient a message to call and schedule and appt before her next refill- tvt

## 2022-04-22 ENCOUNTER — Other Ambulatory Visit: Payer: Self-pay | Admitting: Medical-Surgical

## 2022-04-22 NOTE — Telephone Encounter (Signed)
Scheduled an appointment for 05/25/2022 Mychart '@12'$ :50- tvt

## 2022-04-22 NOTE — Telephone Encounter (Signed)
Please schedule appointment for further refills.  Last office visit 11/04/2021  Last filled 03/26/2022  Sent 15 tablets to the pharmacy. NO refills.

## 2022-04-26 DIAGNOSIS — K2960 Other gastritis without bleeding: Secondary | ICD-10-CM | POA: Diagnosis not present

## 2022-04-26 DIAGNOSIS — K591 Functional diarrhea: Secondary | ICD-10-CM | POA: Diagnosis not present

## 2022-04-26 DIAGNOSIS — Z9884 Bariatric surgery status: Secondary | ICD-10-CM | POA: Diagnosis not present

## 2022-04-26 DIAGNOSIS — R110 Nausea: Secondary | ICD-10-CM | POA: Diagnosis not present

## 2022-04-27 DIAGNOSIS — R197 Diarrhea, unspecified: Secondary | ICD-10-CM | POA: Diagnosis not present

## 2022-04-27 DIAGNOSIS — K580 Irritable bowel syndrome with diarrhea: Secondary | ICD-10-CM | POA: Diagnosis not present

## 2022-04-27 DIAGNOSIS — R1013 Epigastric pain: Secondary | ICD-10-CM | POA: Diagnosis not present

## 2022-04-29 ENCOUNTER — Encounter: Payer: Self-pay | Admitting: Medical-Surgical

## 2022-05-25 ENCOUNTER — Telehealth (INDEPENDENT_AMBULATORY_CARE_PROVIDER_SITE_OTHER): Payer: Self-pay | Admitting: Medical-Surgical

## 2022-05-25 ENCOUNTER — Encounter: Payer: Self-pay | Admitting: Medical-Surgical

## 2022-05-25 DIAGNOSIS — F3175 Bipolar disorder, in partial remission, most recent episode depressed: Secondary | ICD-10-CM

## 2022-05-25 DIAGNOSIS — G2581 Restless legs syndrome: Secondary | ICD-10-CM

## 2022-05-25 DIAGNOSIS — E782 Mixed hyperlipidemia: Secondary | ICD-10-CM

## 2022-05-25 DIAGNOSIS — R634 Abnormal weight loss: Secondary | ICD-10-CM

## 2022-05-25 DIAGNOSIS — G43801 Other migraine, not intractable, with status migrainosus: Secondary | ICD-10-CM

## 2022-05-25 DIAGNOSIS — F5101 Primary insomnia: Secondary | ICD-10-CM

## 2022-05-25 MED ORDER — ROPINIROLE HCL 1 MG PO TABS: 1.0000 mg | ORAL_TABLET | Freq: Every day | ORAL | 1 refills | Status: DC

## 2022-05-25 MED ORDER — CYCLOBENZAPRINE HCL 10 MG PO TABS: 10.0000 mg | ORAL_TABLET | Freq: Three times a day (TID) | ORAL | 1 refills | Status: DC | PRN

## 2022-05-25 MED ORDER — OMEPRAZOLE 40 MG PO CPDR: 40.0000 mg | DELAYED_RELEASE_CAPSULE | Freq: Every day | ORAL | 1 refills | Status: DC

## 2022-05-25 MED ORDER — ARIPIPRAZOLE 10 MG PO TABS: 10.0000 mg | ORAL_TABLET | Freq: Every day | ORAL | 3 refills | Status: DC

## 2022-05-25 MED ORDER — PRAVASTATIN SODIUM 10 MG PO TABS: 10.0000 mg | ORAL_TABLET | Freq: Every day | ORAL | 0 refills | Status: DC

## 2022-05-25 MED ORDER — TRAZODONE HCL 100 MG PO TABS: 200.0000 mg | ORAL_TABLET | Freq: Every evening | ORAL | 0 refills | Status: DC | PRN

## 2022-05-25 NOTE — Progress Notes (Signed)
Virtual Visit via Video Note  I connected with Rachel Rich on 05/25/22 at  1:00 PM EDT by a video enabled telemedicine application and verified that I am speaking with the correct person using two identifiers.   I discussed the limitations of evaluation and management by telemedicine and the availability of in person appointments. The patient expressed understanding and agreed to proceed.  Patient location: home Provider locations: office  Subjective:    CC: Medication refills  HPI: Very pleasant 51 year old female presenting via [place] video for the following:  Migraine: Has not been using Ajovy as she has not had any migraines in the last 6 months.  She does have Imitrex on hand and reports that she usually has to take 100 mg for the medication to work.  Notes that she thinks the improvement in her migraines is related to her cutting out a lot of sodium and caffeine in her diet.  Restless leg syndrome: Notes that she is taking Requip 1 mg nightly which is very helpful for her RLS symptoms.  Unfortunately she continues to have leg cramps that occur multiple times at night.  When these cramps happen, her feet do turn inward and the muscles tighten up.  She is only been able to resolve the cramps by standing and bearing weight for approximately 20 minutes or so before she is able to go back to bed.  She has been taking methocarbamol at bedtime and notes that this is somewhat effective however she does still have breakthrough cramps.  When she does not take the methocarbamol, she notes the cramps are much worse.  Also reports taking multiple vitamins including a potassium supplement and the magnesium supplement but these do not seem to be helping with her leg cramps.  Insomnia: Taking trazodone 200 mg nightly, tolerating well without side effects.  Feels the medication is working well to help her sleep at night outside of her leg cramps issues.  Hyperlipidemia: Taking pravastatin 10 mg  daily, tolerating well without side effects.  Working on following a low-fat diet.  Mood: Taking Abilify 10 mg daily, tolerating well without side effects.  Feels that the medication is working well and keeping her mood stable.  Past medical history, Surgical history, Family history not pertinant except as noted below, Social history, Allergies, and medications have been entered into the medical record, reviewed, and corrections made.   Review of Systems: See HPI for pertinent positives and negatives.   Objective:    General: Speaking clearly in complete sentences without any shortness of breath.  Alert and oriented x3.  Normal judgment. No apparent acute distress.  Impression and Recommendations:    1. Mixed hyperlipidemia Checking lipid panel.  Continue pravastatin 10 mg daily as prescribed. - Lipid panel  2. RLS (restless legs syndrome) Continue Requip 1 mg nightly.  Switching from methocarbamol to Flexeril to see if this is more beneficial with muscle cramps at night.  Discussed possible interventions for further investigation.  So far no laboratory or imaging results have showed a cause for her leg cramps.  We will go ahead and proceed with a nerve conduction study for upper and lower extremities.  3. Primary insomnia Continue trazodone 200 mg nightly as needed.  4. Other migraine with status migrainosus, not intractable Continue Imitrex as needed.  If migraines return and become or frequent, consider restarting Ajovy.  5. Bipolar disorder, in partial remission, most recent episode depressed ([place]) Stable.  Continue Abilify 10 mg daily.  6. Weight loss, unintentional  Checking hemoglobin A1c. - Hemoglobin A1c  I discussed the assessment and treatment plan with the patient. The patient was provided an opportunity to ask questions and all were answered. The patient agreed with the plan and demonstrated an understanding of the instructions.   The patient was advised to call back  or seek an in-person evaluation if the symptoms worsen or if the condition fails to improve as anticipated.  25 minutes of non-face-to-face time was provided during this encounter.  Return in about 6 months (around 11/23/2022) for annual physical exam or sooner if needed.  Clearnce Sorrel, DNP, APRN, FNP-BC [facility]

## 2022-06-02 ENCOUNTER — Telehealth: Payer: Self-pay

## 2022-06-02 DIAGNOSIS — E782 Mixed hyperlipidemia: Secondary | ICD-10-CM | POA: Diagnosis not present

## 2022-06-02 NOTE — Telephone Encounter (Signed)
Patient called in reference to the CPT code that was billed for her video visit on 05/25/2022. She was told that the video visit for a medication refill would be $70. She received a bill for $104 and she contacted the insurance and billing department and they stated that she needed to contact the office to have the provider change the CPT code.  She stated that if she knew that it was going to be $100 that she would have switched doctors over to [place].

## 2022-06-03 LAB — LIPID PANEL
Chol/HDL Ratio: 2.3 ratio (ref 0.0–4.4)
Cholesterol, Total: 151 mg/dL (ref 100–199)
HDL: 65 mg/dL (ref >39)
LDL Chol Calc (NIH): 75 mg/dL (ref 0–99)
Triglycerides: 54 mg/dL (ref 0–149)
VLDL Cholesterol Cal: 11 mg/dL (ref 5–40)

## 2022-06-03 LAB — HEMOGLOBIN A1C
Est. average glucose Bld gHb Est-mCnc: <74 mg/dL
Hgb A1c MFr Bld: <4.2 % — ABNORMAL LOW (ref 4.8–5.6)

## 2022-06-14 NOTE — Telephone Encounter (Signed)
I think she wants you to change the level of service. If this is not appropriate let me know and I will communicate this to the patient.

## 2022-06-25 DIAGNOSIS — K209 Esophagitis, unspecified without bleeding: Secondary | ICD-10-CM | POA: Diagnosis not present

## 2022-06-29 ENCOUNTER — Other Ambulatory Visit: Payer: Self-pay | Admitting: Medical-Surgical

## 2022-07-21 ENCOUNTER — Other Ambulatory Visit: Payer: Self-pay | Admitting: Medical-Surgical

## 2022-08-07 ENCOUNTER — Other Ambulatory Visit: Payer: Self-pay | Admitting: Medical-Surgical

## 2022-08-19 ENCOUNTER — Other Ambulatory Visit: Payer: Self-pay | Admitting: Medical-Surgical

## 2022-08-22 ENCOUNTER — Other Ambulatory Visit: Payer: Self-pay | Admitting: Medical-Surgical

## 2022-10-02 ENCOUNTER — Other Ambulatory Visit: Payer: Self-pay | Admitting: Medical-Surgical

## 2022-10-22 ENCOUNTER — Other Ambulatory Visit: Payer: Self-pay | Admitting: Medical-Surgical

## 2022-10-22 DIAGNOSIS — G43811 Other migraine, intractable, with status migrainosus: Secondary | ICD-10-CM

## 2022-10-26 ENCOUNTER — Other Ambulatory Visit: Payer: Self-pay | Admitting: Medical-Surgical

## 2023-02-19 ENCOUNTER — Other Ambulatory Visit: Payer: Self-pay | Admitting: Medical-Surgical

## 2023-03-30 ENCOUNTER — Telehealth: Payer: Self-pay | Admitting: General Practice

## 2023-03-30 NOTE — Transitions of Care (Post Inpatient/ED Visit) (Signed)
   03/30/2023  Name: Rachel Rich MRN: [id number] DOB: Nov 26, 1970  Today's TOC FU Call Status: Today's TOC FU Call Status:: Unsuccessful Call (1st Attempt) Unsuccessful Call (1st Attempt) Date: 03/30/23  Attempted to reach the patient regarding the most recent Inpatient/ED visit.  Follow Up Plan: Additional outreach attempts will be made to reach the patient to complete the Transitions of Care (Post Inpatient/ED visit) call.   Signature [place] Charon, Control and instrumentation engineer

## 2023-04-04 NOTE — Transitions of Care (Post Inpatient/ED Visit) (Signed)
   04/04/2023  Name: Rachel Rich MRN: [id number] DOB: 1971-06-13  Today's TOC FU Call Status: Today's TOC FU Call Status:: Unsuccessful Call (2nd Attempt) Unsuccessful Call (1st Attempt) Date: 03/30/23 Unsuccessful Call (2nd Attempt) Date: 04/04/23  Attempted to reach the patient regarding the most recent Inpatient/ED visit.  Follow Up Plan: Additional outreach attempts will be made to reach the patient to complete the Transitions of Care (Post Inpatient/ED visit) call.   Signature [place] Charon, Control and instrumentation engineer

## 2023-04-05 NOTE — Transitions of Care (Post Inpatient/ED Visit) (Signed)
   04/05/2023  Name: Rachel Rich MRN: [id number] DOB: 02/21/1971  Today's TOC FU Call Status: Today's TOC FU Call Status:: Unsuccessful Call (3rd Attempt) Unsuccessful Call (1st Attempt) Date: 03/30/23 Unsuccessful Call (2nd Attempt) Date: 04/04/23 Unsuccessful Call (3rd Attempt) Date: 04/05/23  Attempted to reach the patient regarding the most recent Inpatient/ED visit.  Follow Up Plan: Additional outreach attempts will be made to reach the patient to complete the Transitions of Care (Post Inpatient/ED visit) call.   Signature [place] Charon, Control and instrumentation engineer

## 2023-08-17 ENCOUNTER — Other Ambulatory Visit: Payer: Self-pay | Admitting: Medical-Surgical

## 2023-08-17 NOTE — Telephone Encounter (Signed)
Patient has transferred care to a provider with [facility].

## 2023-09-28 ENCOUNTER — Ambulatory Visit (INDEPENDENT_AMBULATORY_CARE_PROVIDER_SITE_OTHER): Payer: Self-pay | Admitting: Physician Assistant

## 2023-09-28 ENCOUNTER — Encounter: Payer: Self-pay | Admitting: Physician Assistant

## 2023-09-28 VITALS — BP 102/68 | HR 71 | Ht 60.0 in | Wt 116.2 lb

## 2023-09-28 DIAGNOSIS — F3175 Bipolar disorder, in partial remission, most recent episode depressed: Secondary | ICD-10-CM

## 2023-09-28 DIAGNOSIS — R8290 Unspecified abnormal findings in urine: Secondary | ICD-10-CM | POA: Diagnosis not present

## 2023-09-28 DIAGNOSIS — E782 Mixed hyperlipidemia: Secondary | ICD-10-CM

## 2023-09-28 DIAGNOSIS — R252 Cramp and spasm: Secondary | ICD-10-CM | POA: Insufficient documentation

## 2023-09-28 DIAGNOSIS — G43811 Other migraine, intractable, with status migrainosus: Secondary | ICD-10-CM

## 2023-09-28 DIAGNOSIS — G2581 Restless legs syndrome: Secondary | ICD-10-CM

## 2023-09-28 DIAGNOSIS — Z0000 Encounter for general adult medical examination without abnormal findings: Secondary | ICD-10-CM | POA: Diagnosis not present

## 2023-09-28 DIAGNOSIS — Z131 Encounter for screening for diabetes mellitus: Secondary | ICD-10-CM

## 2023-09-28 DIAGNOSIS — G43909 Migraine, unspecified, not intractable, without status migrainosus: Secondary | ICD-10-CM | POA: Insufficient documentation

## 2023-09-28 DIAGNOSIS — K219 Gastro-esophageal reflux disease without esophagitis: Secondary | ICD-10-CM

## 2023-09-28 DIAGNOSIS — F5101 Primary insomnia: Secondary | ICD-10-CM

## 2023-09-28 LAB — POCT URINALYSIS DIP (CLINITEK)
Bilirubin, UA: NEGATIVE
Blood, UA: NEGATIVE
Glucose, UA: NEGATIVE mg/dL
Ketones, POC UA: NEGATIVE mg/dL
Leukocytes, UA: NEGATIVE
Nitrite, UA: NEGATIVE
POC PROTEIN,UA: NEGATIVE
Spec Grav, UA: <=1.005 — AB (ref 1.010–1.025)
Urobilinogen, UA: 0.2 U/dL
pH, UA: 5.5 (ref 5.0–8.0)

## 2023-09-28 MED ORDER — NURTEC 75 MG PO TBDP: 1.0000 | ORAL_TABLET | ORAL | 5 refills | Status: DC

## 2023-09-28 MED ORDER — TRAZODONE HCL 100 MG PO TABS: 200.0000 mg | ORAL_TABLET | Freq: Every evening | ORAL | 3 refills | Status: DC | PRN

## 2023-09-28 MED ORDER — SUMATRIPTAN SUCCINATE 50 MG PO TABS: 50.0000 mg | ORAL_TABLET | ORAL | 5 refills | Status: DC | PRN

## 2023-09-28 MED ORDER — ROPINIROLE HCL 1 MG PO TABS: 2.0000 mg | ORAL_TABLET | Freq: Every day | ORAL | 3 refills | Status: DC

## 2023-09-28 MED ORDER — PROMETHAZINE HCL 25 MG PO TABS: 25.0000 mg | ORAL_TABLET | Freq: Every evening | ORAL | 1 refills | Status: DC

## 2023-09-28 MED ORDER — ARIPIPRAZOLE 10 MG PO TABS: 10.0000 mg | ORAL_TABLET | Freq: Every day | ORAL | 3 refills | Status: DC

## 2023-09-28 MED ORDER — CYCLOBENZAPRINE HCL 10 MG PO TABS: ORAL_TABLET | ORAL | 3 refills | Status: DC

## 2023-09-28 MED ORDER — OMEPRAZOLE 40 MG PO CPDR: 40.0000 mg | DELAYED_RELEASE_CAPSULE | Freq: Every day | ORAL | 3 refills | Status: DC

## 2023-09-28 NOTE — Progress Notes (Signed)
 New Patient Office Visit  Subjective    Patient ID: Rachel Rich, female    DOB: 1971/02/08  Age: 53 y.o. MRN: [id number]  CC:  Chief Complaint  Patient presents with   Annual Exam   Spasms    Arm, legs, stomach    HPI Rachel Rich presents to re-establish care and get a wellness check.   She is having 4 migraine days/month. She takes Sumatriptan but states it does not always help rescue migraine and she has to take another dose within the 2 hours.   She endorses muscle cramps in her arms, legs, and abdomen x2 years. She believes it is related to her gastric bypass. She states these muscle spasms happen at night when she is trying to relax. She states she drinks "at least a gallon" of water daily. She is concerned for MS due to family history of her cousins and aunts having the diagnosis.    She says her urine has a strong odor and is sometimes green. She does take a lot of vitamins.    Outpatient Encounter Medications as of 09/28/2023  Medication Sig   acetaminophen (TYLENOL) 650 MG CR tablet Take 1 tablet (650 mg total) by mouth every 8 (eight) hours as needed for pain.   ARIPiprazole (ABILIFY) 10 MG tablet Take 1 tablet (10 mg total) by mouth daily.   cyclobenzaprine (FLEXERIL) 10 MG tablet TAKE 1 TABLET 3 TIMES DAILY AS NEEDED FOR MUSCLE SPASMS.   omeprazole (PRILOSEC) 40 MG capsule Take 1 capsule (40 mg total) by mouth daily.   pravastatin (PRAVACHOL) 10 MG tablet TAKE 1 TABLET (10 MG TOTAL) BY MOUTH DAILY. NEEDS APPOINTMENT FOR FURTHER REFILLS.   promethazine (PHENERGAN) 25 MG tablet Take 1 tablet (25 mg total) by mouth at bedtime.   Rimegepant Sulfate (NURTEC) 75 MG TBDP Take 1 tablet (75 mg total) by mouth every other day.   rOPINIRole (REQUIP) 1 MG tablet Take 2 tablets (2 mg total) by mouth at bedtime.   SUMAtriptan (IMITREX) 50 MG tablet Take 1-2 tablets (50-100 mg total) by mouth every 2 (two) hours as needed for migraine. May repeat in 2 hours if headache  persists. Max daily dose 200 mg.   traZODone (DESYREL) 100 MG tablet Take 2 tablets (200 mg total) by mouth at bedtime as needed for sleep.   [DISCONTINUED] ARIPiprazole (ABILIFY) 10 MG tablet Take 1 tablet (10 mg total) by mouth daily.   [DISCONTINUED] cyclobenzaprine (FLEXERIL) 10 MG tablet TAKE 1 TABLET 3 TIMES DAILY AS NEEDED FOR MUSCLE SPASMS. NEEDS APPOINTMENT FOR FURTHER REFILLS.   [DISCONTINUED] Fremanezumab-vfrm (AJOVY) 225 MG/1.5ML SOSY Inject 225 mg into the skin every 30 (thirty) days.   [DISCONTINUED] omeprazole (PRILOSEC) 40 MG capsule Take 1 capsule (40 mg total) by mouth daily.   [DISCONTINUED] rOPINIRole (REQUIP) 0.25 MG tablet TAKE 1 TABLET (0.25 MG TOTAL) BY MOUTH AT BEDTIME. NEEDS APPOINTMENT FOR FURTHER REFILLS.   [DISCONTINUED] rOPINIRole (REQUIP) 1 MG tablet Take 1 tablet (1 mg total) by mouth at bedtime.   [DISCONTINUED] SUMAtriptan (IMITREX) 50 MG tablet Take 1-2 tablets (50-100 mg total) by mouth every 2 (two) hours as needed for migraine. May repeat in 2 hours if headache persists. Max daily dose 200 mg. NEEDS APPOINTMENT FOR FURTHER REFILLS.   [DISCONTINUED] traZODone (DESYREL) 100 MG tablet Take 2 tablets (200 mg total) by mouth at bedtime as needed for sleep. NEEDS APPOINTMENT FOR FURTHER REFILLS.   No facility-administered encounter medications on file as of 09/28/2023.    Past  Medical History:  Diagnosis Date   Asthma, mild persistent 07/25/2014   Symbicort 160-4.5 outside records    Fibromyalgia 03/26/2014   Tramadol on outside records.    Hyperlipidemia    Menopause 07/25/2014   Estradiol 2mg  QD on outside records.    Other migraine with status migrainosus, not intractable 03/26/2014   RLS (restless legs syndrome) 03/26/2014   Rosacea 07/25/2014    Past Surgical History:  Procedure Laterality Date   ABDOMINAL HYSTERECTOMY  2009   masses on ovaries, endometriosis, no cancer diagnosis   APPENDECTOMY  1979   BARIATRIC SURGERY     done in [place]   BREAST  LUMPECTOMY Left 2010   pre-cancerous    CESAREAN SECTION  2001,2007   x2    GALLBLADDER SURGERY  2000   HERNIA REPAIR  2009   ventral from c/s   KNEE SURGERY Right    multiple procedures - arthroscopic    TONSILLECTOMY  1976    Family History  Problem Relation Age of Onset   Alcoholism Father    Heart attack Father    Hyperlipidemia Father    Cancer Mother    Depression Mother    Stroke Mother    Cancer Sister     Social History   Socioeconomic History   Marital status: Married    Spouse name: Not on file   Number of children: Not on file   Years of education: Not on file   Highest education level: Not on file  Occupational History   Not on file  Tobacco Use   Smoking status: Never   Smokeless tobacco: Never  Substance and Sexual Activity   Alcohol use: No   Drug use: Yes   Sexual activity: Yes    Partners: Male    Birth control/protection: Other-see comments    Comment: s/p hysterectomy   Other Topics Concern   Not on file  Social History Narrative   Not on file   Social Drivers of Health   Financial Resource Strain: Low Risk  (10/20/2021)   Received from [facility], [facility]   Overall Financial Resource Strain (CARDIA)    Difficulty of Paying Living Expenses: Not very hard  Food Insecurity: Low Risk  (02/15/2023)   Received from [facility]   Hunger Vital Sign    Worried About Running Out of Food in the Last Year: Never true    Ran Out of Food in the Last Year: Never true  Transportation Needs: Not on file (02/15/2023)  Physical Activity: Insufficiently Active (10/20/2021)   [facility] from [facility], [facility]   Exercise Vital Sign    Days of Exercise per Week: 1 day    Minutes of Exercise per Session: 10 min  Stress: Stress Concern Present (10/20/2021)   Received from [facility], [facility]   [employer] of Occupational Health - Occupational Stress Questionnaire    Feeling of Stress : Rather much  Social Connections:  Unknown (11/05/2022)   Received from [facility]   Social Network    Social Network: Not on file  Intimate Partner Violence: Unknown (11/05/2022)   Received from [facility]   HITS    Physically Hurt: Not on file    Insult or Talk Down To: Not on file    Threaten Physical Harm: Not on file    Scream or Curse: Not on file    Review of Systems  Musculoskeletal:  Positive for myalgias.       Cramping in her arms,  legs, and abdomen   Objective    BP 102/68 (BP Location: Left Arm, Patient Position: Sitting, Cuff Size: Normal)   Pulse 71   Ht 5' (1.524 m)   Wt 116 lb 4 oz (52.7 kg)   SpO2 95%   BMI 22.70 kg/m   Physical Exam Constitutional:      Appearance: Normal appearance.  HENT:     Head: Normocephalic and atraumatic.     Nose: Nose normal.  Eyes:     Extraocular Movements: Extraocular movements intact.  Neck:     Comments: Thyroid goiter on left side Cardiovascular:     Rate and Rhythm: Normal rate and regular rhythm.     Pulses: Normal pulses.     Heart sounds: Normal heart sounds.  Pulmonary:     Effort: Pulmonary effort is normal.     Breath sounds: Normal breath sounds.  Abdominal:     General: Abdomen is flat.     Palpations: Abdomen is soft.  Musculoskeletal:        General: Normal range of motion.     Cervical back: Normal range of motion. No tenderness.  Lymphadenopathy:     Cervical: No cervical adenopathy.  Skin:    General: Skin is warm.  Neurological:     Mental Status: She is alert.  Psychiatric:        Mood and Affect: Mood normal.        Behavior: Behavior normal.    Assessment & Plan:  Marland KitchenMarland KitchenNavreet was seen today for annual exam and spasms.  Diagnoses and all orders for this visit:  Preventative health care  Other migraine with status migrainosus, intractable -     SUMAtriptan (IMITREX) 50 MG tablet; Take 1-2 tablets (50-100 mg total) by mouth every 2 (two) hours as needed for migraine. May repeat in 2 hours if headache persists. Max  daily dose 200 mg. -     Rimegepant Sulfate (NURTEC) 75 MG TBDP; Take 1 tablet (75 mg total) by mouth every other day. -     promethazine (PHENERGAN) 25 MG tablet; Take 1 tablet (25 mg total) by mouth at bedtime.  Muscle cramps at night -     cyclobenzaprine (FLEXERIL) 10 MG tablet; TAKE 1 TABLET 3 TIMES DAILY AS NEEDED FOR MUSCLE SPASMS. -     Magnesium -     CMP14+EGFR -     TSH + free T4 -     B12 and Folate Panel -     VITAMIN D 25 Hydroxy (Vit-D Deficiency, Fractures) -     ANA,IFA RA Diag Pnl w/rflx Tit/Patn -     Lipid panel -     CBC w/Diff/Platelet -     Fe+TIBC+Fer  Mixed hyperlipidemia -     Lipid panel  Screening for diabetes mellitus -     CMP14+EGFR  RLS (restless legs syndrome) -     rOPINIRole (REQUIP) 1 MG tablet; Take 2 tablets (2 mg total) by mouth at bedtime.  Primary insomnia -     traZODone (DESYREL) 100 MG tablet; Take 2 tablets (200 mg total) by mouth at bedtime as needed for sleep.  Gastroesophageal reflux disease without esophagitis -     omeprazole (PRILOSEC) 40 MG capsule; Take 1 capsule (40 mg total) by mouth daily.  Bipolar disorder, in partial remission, most recent episode depressed ([facility]) -     ARIPiprazole (ABILIFY) 10 MG tablet; Take 1 tablet (10 mg total) by mouth daily.  Bad odor of urine -  POCT URINALYSIS DIP (CLINITEK)   .Marland Kitchen Discussed 150 minutes of exercise a week.  Encouraged vitamin D 1000 units and Calcium 1300mg  or 4 servings of dairy a day.  Fasting labs ordered today, added ANA for autoimmune disorders PHQ no concerns UA no concerns, likely color and odor due to vitamins, will culture, follow up with any concerning changes Mammogram/pap-will get copy Colonoscopy-will get records Refilled medications Added nurtec for migraine rescue, discussed how to use and side effects Discussed magnesium, hydration, stretching to avoid muscle cramps at night-will check labs to look for any causes-follow up if persist or worsens.   Declined covid vaccination  Follow up in 3 months on migraines and medications  Tandy Gaw, PA-C

## 2023-09-28 NOTE — Patient Instructions (Signed)
 Get labs today.  Start nurtec as needed for migraine rescue

## 2023-09-29 ENCOUNTER — Encounter: Payer: Self-pay | Admitting: Physician Assistant

## 2023-10-03 ENCOUNTER — Encounter: Payer: Self-pay | Admitting: Physician Assistant

## 2023-10-03 NOTE — Progress Notes (Signed)
 Chianna,   Hemoglobin and WBC looks good.  Cholesterol looks great!  ANA still pending.  B12 and folate normal.  Thyroid looks GREAT.  Magnesium normal.  Iron stores are low, increase iron rich foods. Are you taking any iron supplement?   Vitamin D low. Increase vitamin D by 2000 units and take with dairy for better absorption.   Your liver enzymes are up and calcium is down. How much calcium are you taking daily? Stop any alcohol and tylenol products and recheck labs (bmp, PTH) in 2 weeks.

## 2023-10-04 ENCOUNTER — Telehealth: Payer: Self-pay

## 2023-10-04 ENCOUNTER — Ambulatory Visit: Payer: Self-pay | Admitting: Physician Assistant

## 2023-10-04 NOTE — Telephone Encounter (Signed)
 Chief Complaint: Headache  Symptoms: Migraine headache, nausea, vomiting, sensitive to light and sound Frequency: Constant  Pertinent Negatives: Patient denies fever, chest pain, SOB, abdominal pain  Disposition: [] ED /[] Urgent Care (no appt availability in office) / [] Appointment(In office/virtual)/ []  [facility]/ [] Home Care/ [] Refused Recommended Disposition /[] [facility]/ [x]  Follow-up with PCP Additional Notes: Patient states she has a migraine headache that started on Sunday. Patient states she left work early yesterday due to the symptoms. Patient states when she takes the Imitrex the migraine improves but is not completely gone. Patient states she was prescribed  Nurtec but has not been able to pick-up because the pharmacy stated it needs a Prior Authorization completed.  Care advice given and patient offered an appointment. Patient declined and asked to have PA filled out for the medication to see if that will be helpful for her symptoms. Advised I would forward request to PCP.  Copied from CRM [id number]. Topic: Clinical - Red Word Triage >> Oct 04, 2023  9:17 AM Rachel Rich wrote: Red Word that prompted transfer to Nurse Triage: Ongoing migraine starting Sunday night. Pain woke her up and has not let up since. Has been taking up to two Imitrex at a time, appears to delay symptoms but does not help in the long term. Has left work early due to vision changes. Reason for Disposition  [1] MILD-MODERATE headache AND [2] present > 72 hours  Answer Assessment - Initial Assessment Questions 1. LOCATION: "Where does it hurt?"      Like a band around the front and back of head  2. ONSET: "When did the headache start?" (Minutes, hours or days)      Sunday  3. PATTERN: "Does the pain come and go, or has it been constant since it started?"     Constant  4. SEVERITY: "How bad is the pain?" and "What does it keep you from doing?"  (e.g., Scale 1-10; mild, moderate, or severe)   -  MILD (1-3): doesn't interfere with normal activities    - MODERATE (4-7): interferes with normal activities or awakens from sleep    - SEVERE (8-10): excruciating pain, unable to do any normal activities        8/10 5. RECURRENT SYMPTOM: "Have you ever had headaches before?" If Yes, ask: "When was the last time?" and "What happened that time?"      Yes, imitrex  6. CAUSE: "What do you think is causing the headache?"     Migraine  7. MIGRAINE: "Have you been diagnosed with migraine headaches?" If Yes, ask: "Is this headache similar?"      Yes 8. HEAD INJURY: "Has there been any recent injury to the head?"      No  9. OTHER SYMPTOMS: "Do you have any other symptoms?" (fever, stiff neck, eye pain, sore throat, cold symptoms)     Nausea, blurred vision, sensitive to light and sound  Protocols used: Headache-A-AH

## 2023-10-04 NOTE — Telephone Encounter (Signed)
 Attempted call to patient. Left a voice mail message requesting a return call.

## 2023-10-04 NOTE — Telephone Encounter (Signed)
 Copied from CRM [id number]. Topic: General - Other >> Oct 04, 2023  4:47 PM Emylou G wrote: Reason for CRM: patient returned call said will schedule visit - if doesn't feel better.. but she is feeling better now.

## 2023-10-06 ENCOUNTER — Telehealth: Payer: Self-pay

## 2023-10-06 LAB — IRON,TIBC AND FERRITIN PANEL
Ferritin: 9 ng/mL — ABNORMAL LOW (ref 15–150)
Iron Saturation: 18 % (ref 15–55)
Iron: 79 ug/dL (ref 27–159)
Total Iron Binding Capacity: 434 ug/dL (ref 250–450)
UIBC: 355 ug/dL (ref 131–425)

## 2023-10-06 LAB — CBC WITH DIFFERENTIAL/PLATELET
Basophils Absolute: 0.1 x10E3/uL (ref 0.0–0.2)
Basos: 1 %
EOS (ABSOLUTE): 0.1 x10E3/uL (ref 0.0–0.4)
Eos: 3 %
Hematocrit: 35.2 % (ref 34.0–46.6)
Hemoglobin: 11.1 g/dL (ref 11.1–15.9)
Immature Grans (Abs): 0 x10E3/uL (ref 0.0–0.1)
Immature Granulocytes: 0 %
Lymphocytes Absolute: 1.4 x10E3/uL (ref 0.7–3.1)
Lymphs: 30 %
MCH: 28.2 pg (ref 26.6–33.0)
MCHC: 31.5 g/dL (ref 31.5–35.7)
MCV: 89 fL (ref 79–97)
Monocytes Absolute: 0.3 x10E3/uL (ref 0.1–0.9)
Monocytes: 6 %
Neutrophils Absolute: 2.9 x10E3/uL (ref 1.4–7.0)
Neutrophils: 60 %
Platelets: 219 x10E3/uL (ref 150–450)
RBC: 3.94 x10E6/uL (ref 3.77–5.28)
RDW: 12.7 % (ref 11.7–15.4)
WBC: 4.7 x10E3/uL (ref 3.4–10.8)

## 2023-10-06 LAB — LIPID PANEL
Chol/HDL Ratio: 2.6 ratio (ref 0.0–4.4)
Cholesterol, Total: 176 mg/dL (ref 100–199)
HDL: 68 mg/dL (ref >39)
LDL Chol Calc (NIH): 97 mg/dL (ref 0–99)
Triglycerides: 59 mg/dL (ref 0–149)
VLDL Cholesterol Cal: 11 mg/dL (ref 5–40)

## 2023-10-06 LAB — CMP14+EGFR
ALT: 70 IU/L — ABNORMAL HIGH (ref 0–32)
AST: 51 IU/L — ABNORMAL HIGH (ref 0–40)
Albumin: 4.2 g/dL (ref 3.8–4.9)
Alkaline Phosphatase: 132 IU/L — ABNORMAL HIGH (ref 44–121)
BUN/Creatinine Ratio: 18 (ref 9–23)
BUN: 14 mg/dL (ref 6–24)
Bilirubin Total: 0.4 mg/dL (ref 0.0–1.2)
CO2: 21 mmol/L (ref 20–29)
Calcium: 8.4 mg/dL — ABNORMAL LOW (ref 8.7–10.2)
Chloride: 108 mmol/L — ABNORMAL HIGH (ref 96–106)
Creatinine, Ser: 0.77 mg/dL (ref 0.57–1.00)
Globulin, Total: 2 g/dL (ref 1.5–4.5)
Glucose: 79 mg/dL (ref 70–99)
Potassium: 4.5 mmol/L (ref 3.5–5.2)
Sodium: 142 mmol/L (ref 134–144)
Total Protein: 6.2 g/dL (ref 6.0–8.5)
eGFR: 92 mL/min/{1.73_m2} (ref >59)

## 2023-10-06 LAB — ANA,IFA RA DIAG PNL W/RFLX TIT/PATN
Cyclic Citrullin Peptide Ab: 6 U (ref 0–19)
Rheumatoid fact SerPl-aCnc: <10 [IU]/mL (ref <14.0)

## 2023-10-06 LAB — VITAMIN D 25 HYDROXY (VIT D DEFICIENCY, FRACTURES): Vit D, 25-Hydroxy: 17.4 ng/mL — ABNORMAL LOW (ref 30.0–100.0)

## 2023-10-06 LAB — B12 AND FOLATE PANEL
Folate: >20 ng/mL (ref >3.0)
Vitamin B-12: 479 pg/mL (ref 232–1245)

## 2023-10-06 LAB — TSH+FREE T4
Free T4: 1.2 ng/dL (ref 0.82–1.77)
TSH: 1.61 u[IU]/mL (ref 0.450–4.500)

## 2023-10-06 LAB — FANA STAINING PATTERNS: Speckled Pattern: 1:640 {titer} — ABNORMAL HIGH

## 2023-10-06 LAB — MAGNESIUM: Magnesium: 2 mg/dL (ref 1.6–2.3)

## 2023-10-06 NOTE — Telephone Encounter (Signed)
 Copied from CRM [id number]. Topic: Clinical - Prescription Issue >> Oct 04, 2023  9:14 AM Nila Nephew wrote: Reason for CRM: PA for rescue for migraines needs to be completed. Imitrex is not working and patient is inquiring about an alternative that can be prescribed until a PA is completed.

## 2023-10-07 MED ORDER — RIZATRIPTAN BENZOATE 10 MG PO TBDP: 10.0000 mg | ORAL_TABLET | ORAL | 0 refills | Status: DC | PRN

## 2023-10-07 NOTE — Progress Notes (Signed)
 ANA was positive but at a low conversion. Negative rheumatoid factor. We could consider a rheumatology referral for consult. Are you ok with this?

## 2023-10-07 NOTE — Addendum Note (Signed)
 Addended by: Jomarie Longs on: 10/07/2023 12:34 PM   Modules accepted: Orders

## 2023-10-10 ENCOUNTER — Encounter: Payer: Self-pay | Admitting: Physician Assistant

## 2023-10-10 DIAGNOSIS — G43801 Other migraine, not intractable, with status migrainosus: Secondary | ICD-10-CM

## 2023-10-10 DIAGNOSIS — R519 Headache, unspecified: Secondary | ICD-10-CM

## 2023-10-20 NOTE — Telephone Encounter (Signed)
 PA required for Nurtec. Thanks in advance.

## 2023-10-23 ENCOUNTER — Ambulatory Visit (HOSPITAL_BASED_OUTPATIENT_CLINIC_OR_DEPARTMENT_OTHER)
Admission: RE | Admit: 2023-10-23 | Discharge: 2023-10-23 | Disposition: A | Discharge status: Home / Self Care | Source: Ambulatory Visit | Attending: Physician Assistant | Admitting: Physician Assistant

## 2023-10-23 DIAGNOSIS — G43801 Other migraine, not intractable, with status migrainosus: Secondary | ICD-10-CM | POA: Insufficient documentation

## 2023-10-23 DIAGNOSIS — R519 Headache, unspecified: Secondary | ICD-10-CM | POA: Insufficient documentation

## 2023-10-23 MED ORDER — GADOBUTROL 1 MMOL/ML IV SOLN
5.0000 mL | Freq: Once | INTRAVENOUS | Status: AC | PRN
  Administered 2023-10-23: 5 mL via INTRAVENOUS

## 2023-10-26 ENCOUNTER — Encounter: Payer: Self-pay | Admitting: Physician Assistant

## 2023-10-26 ENCOUNTER — Telehealth: Payer: Self-pay

## 2023-10-26 ENCOUNTER — Other Ambulatory Visit (HOSPITAL_COMMUNITY): Payer: Self-pay

## 2023-10-26 DIAGNOSIS — G43811 Other migraine, intractable, with status migrainosus: Secondary | ICD-10-CM

## 2023-10-26 NOTE — Telephone Encounter (Signed)
 Pharmacy Patient Advocate Encounter  Received notification from [facility] that Prior Authorization for Nurtec 75MG  dispersible tablets has been APPROVED from 09/26/23 to 10/25/24. Ran test claim, Copay is $0. This test claim was processed through [facility]- copay amounts may vary at other pharmacies due to pharmacy/plan contracts, or as the patient moves through the different stages of their insurance plan.   PA #/Case ID/Reference #: [id number]

## 2023-10-27 LAB — HM MAMMOGRAPHY

## 2023-10-27 NOTE — Telephone Encounter (Signed)
MRI not yet resulted

## 2023-10-28 ENCOUNTER — Encounter: Payer: Self-pay | Admitting: Physician Assistant

## 2023-11-01 NOTE — Telephone Encounter (Signed)
 Rachel Rich  - the mammogram results of another patient was scanned into this patient's chart in error. Is there a way to remove the erroneous report from this patient's record?  Thanks in advance.

## 2023-11-18 ENCOUNTER — Encounter: Payer: Self-pay | Admitting: Physician Assistant

## 2023-11-18 NOTE — Progress Notes (Signed)
 MRI normal. No acute findings. How are HAs? Do we need to make other interventions sooner?

## 2023-11-23 ENCOUNTER — Telehealth: Payer: Self-pay | Admitting: Physician Assistant

## 2023-11-23 ENCOUNTER — Other Ambulatory Visit: Payer: Self-pay | Admitting: Physician Assistant

## 2023-11-23 DIAGNOSIS — G43811 Other migraine, intractable, with status migrainosus: Secondary | ICD-10-CM

## 2023-11-23 NOTE — Telephone Encounter (Signed)
 Copied from CRM [id number]. Topic: Clinical - Medication Refill >> Nov 23, 2023  1:21 PM Jayson Michael wrote: Most Recent Primary Care Visit:  Provider: Araceli Knight  Department: [facility]  Visit Type: PHYSICAL  Date: 09/28/2023  Medication: promethazine  (PHENERGAN ) 25 MG tablet   Has the patient contacted their pharmacy? Yes [facility] has not received it    Is this the correct pharmacy for this prescription? Yes If no, delete pharmacy and type the correct one.  This is the patient's preferred pharmacy:   [id number] - [place], [place] - [address] [place] [address] Phone: [phone] Fax: [phone]   Has the prescription been filled recently? No  Is the patient out of the medication? Yes  Has the patient been seen for an appointment in the last year OR does the patient have an upcoming appointment? Yes  Can we respond through MyChart? Yes  Agent: Please be advised that Rx refills may take up to 3 business days. We ask that you follow-up with your pharmacy.

## 2023-11-23 NOTE — Telephone Encounter (Signed)
 Patient states she spoke a nurse  just before my return call who put in a  refill for the phenergen for her and nothing further is needed from our office.

## 2023-11-23 NOTE — Telephone Encounter (Signed)
 Requesting rx rf of phenergen 25mg   Last written  09/28/2023 for qty #90 with one refill Patient should still have medication  Called patient. Left a voice mail message requesting a return call.

## 2023-11-23 NOTE — Telephone Encounter (Signed)
 Spoke with patient.

## 2023-11-23 NOTE — Telephone Encounter (Signed)
 Copied from CRM [id number]. Topic: General - Other >> Nov 23, 2023  1:27 PM Jayson Michael wrote: Patient returned missed call stating she was on hold waiting for Burdette Carolin when the call dropped. Advised patient this occurred during lunch hours. She was calling regarding her Promethazine  (PHENERGAN ) 25 MG tablet. Read Kim's most recent note verbatim; patient stated the pharmacy is still saying they have not received anything. Chart shows last fill on 09/28/2023. No refill request was pending, so I submitted one. Patient stated no call back is needed, as this resolved her concern.

## 2024-01-03 ENCOUNTER — Ambulatory Visit: Admitting: Physician Assistant

## 2024-01-03 ENCOUNTER — Telehealth: Payer: Self-pay

## 2024-01-03 ENCOUNTER — Encounter: Payer: Self-pay | Admitting: Physician Assistant

## 2024-01-03 VITALS — BP 90/55 | HR 77 | Ht 60.0 in | Wt 115.0 lb

## 2024-01-03 DIAGNOSIS — L821 Other seborrheic keratosis: Secondary | ICD-10-CM

## 2024-01-03 DIAGNOSIS — I959 Hypotension, unspecified: Secondary | ICD-10-CM | POA: Diagnosis not present

## 2024-01-03 DIAGNOSIS — G43001 Migraine without aura, not intractable, with status migrainosus: Secondary | ICD-10-CM

## 2024-01-03 DIAGNOSIS — F3175 Bipolar disorder, in partial remission, most recent episode depressed: Secondary | ICD-10-CM | POA: Diagnosis not present

## 2024-01-03 DIAGNOSIS — F5101 Primary insomnia: Secondary | ICD-10-CM | POA: Diagnosis not present

## 2024-01-03 DIAGNOSIS — H6992 Unspecified Eustachian tube disorder, left ear: Secondary | ICD-10-CM

## 2024-01-03 MED ORDER — METHYLPREDNISOLONE 4 MG PO TBPK: ORAL_TABLET | ORAL | 0 refills | Status: DC

## 2024-01-03 MED ORDER — RIZATRIPTAN BENZOATE 10 MG PO TBDP: 10.0000 mg | ORAL_TABLET | ORAL | 5 refills | Status: DC | PRN

## 2024-01-03 MED ORDER — HYDROXYZINE PAMOATE 25 MG PO CAPS: 25.0000 mg | ORAL_CAPSULE | Freq: Three times a day (TID) | ORAL | 0 refills | Status: DC | PRN

## 2024-01-03 MED ORDER — FLUTICASONE PROPIONATE 50 MCG/ACT NA SUSP: 2.0000 | Freq: Every day | NASAL | 1 refills | Status: DC

## 2024-01-03 MED ORDER — HYDROXYZINE PAMOATE 25 MG PO CAPS
ORAL_CAPSULE | ORAL | 1 refills | Status: DC
Start: 2024-01-03 — End: 2024-03-06

## 2024-01-03 NOTE — Patient Instructions (Addendum)
 Start with nurtec for migraine rescue and then move to rizatriptan .   Medrol dose pack and start flonase  daily for ETD. Consider zyrtec/claritin D to see if helps with symptoms.  May need to get back in with ENT.   Seborrheic Keratosis A seborrheic keratosis is a common, noncancerous (benign) skin growth. These growths are velvety, waxy, or rough spots that appear on the skin. They are often tan, brown, or black. The skin growths can be flat or raised and may be scaly. What are the causes? The cause of this condition is not known. What increases the risk? You are more likely to develop this condition if you: Have a family history of seborrheic keratosis. Are 42 years old or older. Are pregnant. Have had estrogen replacement therapy. What are the signs or symptoms? Symptoms of this condition include growths on the face, chest, shoulders, back, or other areas. These growths: Are usually painless, but may become irritated and itchy. Can be tan, yellow, brown, black, or other colors. Are slightly raised or have a flat surface. Are sometimes rough or wart-like in texture. Are often velvety or waxy on the surface. Are round or oval-shaped. Often occur in groups, but may occur as a single growth. How is this diagnosed? This condition is diagnosed with a medical history and physical exam. A sample of the growth may be tested (skin biopsy). You may also need to see a skin specialist (dermatologist). How is this treated? Treatment is not usually needed for this condition unless the growths are irritated or bleed often. You may also choose to have the growths removed if you do not like their appearance. Growth removal may include a procedure in which: Liquid nitrogen is applied to "freeze" off the growth (cryosurgery). This is the most common procedure. The growth is burned off with electricity (electrocautery). The growth is removed by scraping (curettage). Follow these instructions at  home: Watch your growth or growths for any changes. Do not scratch or pick at the growth or growths. This can cause them to become irritated or infected. Contact a health care provider if: You suddenly have many new growths. Your growth bleeds, itches, or hurts. Your growth suddenly becomes larger or changes color. Summary A seborrheic keratosis is a common, noncancerous skin growth. Treatment is not usually needed for this condition unless the growths are irritated or bleed often. Watch your growth or growths for any changes. Contact a health care provider if you suddenly have many new growths or your growth suddenly becomes larger or changes color. This information is not intended to replace advice given to you by your health care provider. Make sure you discuss any questions you have with your health care provider. Document Revised: 09/25/2021 Document Reviewed: 09/25/2021 Elsevier Patient Education  2024 [employer] Inc.  Eustachian Tube Dysfunction  Eustachian tube dysfunction refers to a condition in which a blockage develops in the narrow passage that connects the middle ear to the back of the nose (eustachian tube). The eustachian tube regulates air pressure in the middle ear by letting air move between the ear and nose. It also helps to drain fluid from the middle ear space. Eustachian tube dysfunction can affect one or both ears. When the eustachian tube does not function properly, air pressure, fluid, or both can build up in the middle ear. What are the causes? This condition occurs when the eustachian tube becomes blocked or cannot open normally. Common causes of this condition include: Ear infections. Colds and other infections that  affect the nose, mouth, and throat (upper respiratory tract). Allergies. Irritation from cigarette smoke. Irritation from stomach acid coming up into the esophagus (gastroesophageal reflux). The esophagus is the part of the body that moves food from  the mouth to the stomach. Sudden changes in air pressure, such as from descending in an airplane or scuba diving. Abnormal growths in the nose or throat, such as: Growths that line the nose (nasal polyps). Abnormal growth of cells (tumors). Enlarged tissue at the back of the throat (adenoids). What increases the risk? You are more likely to develop this condition if: You smoke. You are overweight. You are a child who has: Certain birth defects of the mouth, such as cleft palate. Large tonsils or adenoids. What are the signs or symptoms? Common symptoms of this condition include: A feeling of fullness in the ear. Ear pain. Clicking or popping noises in the ear. Ringing in the ear (tinnitus). Hearing loss. Loss of balance. Dizziness. Symptoms may get worse when the air pressure around you changes, such as when you travel to an area of high elevation, fly on an airplane, or go scuba diving. How is this diagnosed? This condition may be diagnosed based on: Your symptoms. A physical exam of your ears, nose, and throat. Tests, such as those that measure: The movement of your eardrum. Your hearing (audiometry). How is this treated? Treatment depends on the cause and severity of your condition. In mild cases, you may relieve your symptoms by moving air into your ears. This is called "popping the ears." In more severe cases, or if you have symptoms of fluid in your ears, treatment may include: Medicines to relieve congestion (decongestants). Medicines that treat allergies (antihistamines). Nasal sprays or ear drops that contain medicines that reduce swelling (steroids). A procedure to drain the fluid in your eardrum. In this procedure, a small tube may be placed in the eardrum to: Drain the fluid. Restore the air in the middle ear space. A procedure to insert a balloon device through the nose to inflate the opening of the eustachian tube (balloon dilation). Follow these instructions at  home: Lifestyle Do not do any of the following until your health care provider approves: Travel to high altitudes. Fly in airplanes. Work in a Estate agent or room. Scuba dive. Do not use any products that contain nicotine or tobacco. These products include cigarettes, chewing tobacco, and vaping devices, such as e-cigarettes. If you need help quitting, ask your health care provider. Keep your ears dry. Wear fitted earplugs during showering and bathing. Dry your ears completely after. General instructions Take over-the-counter and prescription medicines only as told by your health care provider. Use techniques to help pop your ears as recommended by your health care provider. These may include: Chewing gum. Yawning. Frequent, forceful swallowing. Closing your mouth, holding your nose closed, and gently blowing as if you are trying to blow air out of your nose. Keep all follow-up visits. This is important. Contact a health care provider if: Your symptoms do not go away after treatment. Your symptoms come back after treatment. You are unable to pop your ears. You have: A fever. Pain in your ear. Pain in your head or neck. Fluid draining from your ear. Your hearing suddenly changes. You become very dizzy. You lose your balance. Get help right away if: You have a sudden, severe increase in any of your symptoms. Summary Eustachian tube dysfunction refers to a condition in which a blockage develops in the eustachian tube. It  can be caused by ear infections, allergies, inhaled irritants, or abnormal growths in the nose or throat. Symptoms may include ear pain or fullness, hearing loss, or ringing in the ears. Mild cases are treated with techniques to unblock the ears, such as yawning or chewing gum. More severe cases are treated with medicines or procedures. This information is not intended to replace advice given to you by your health care provider. Make sure you discuss any  questions you have with your health care provider. Document Revised: 09/22/2020 Document Reviewed: 09/22/2020 Elsevier Patient Education  2024 [employer].

## 2024-01-03 NOTE — Telephone Encounter (Signed)
 Attempted call to patient. Left a voice mail message requesting a return call.

## 2024-01-03 NOTE — Telephone Encounter (Signed)
 Copied from CRM [id number]. Topic: Clinical - Medical Advice >> Jan 03, 2024  9:53 AM Wynona Hedger wrote: Reason for CRM: Patient called to speak to nurse regarding her medication. Please call [phone]

## 2024-01-03 NOTE — Progress Notes (Signed)
 Established Patient Office Visit  Subjective   Patient ID: Rachel Rich, female    DOB: [birth year]  Age: 53 y.o. MRN: [id number]  Chief Complaint  Patient presents with   Migraine   Nevus   Insomnia    Migraine  Associated symptoms include hearing loss and insomnia. Pertinent negatives include no dizziness, ear pain, fever, tingling or tinnitus.  Insomnia   Rachel Rich presents today for a follow up on her migraines. She is having 4 migraine days a month. When she has one, she takes Rizatriptan  first which partially resolves her symptoms but after an hour the migraine gets worse again. At that point, she takes a Nurtec (about an hour after the rizatriptan ) and this resolves her symptoms completely. She states that her migraine symptoms are manageable at the moment and does not want to change any of her medication. She denies numbness, tingling, weakness.   She is having a harder time sleeping at night for the past few months, No medication changes or added stressors. She states her mind just races sometimes, She is currently taking Abilify  10 MG at night and Trazodone . The Trazodone  helps but if she doesn't fall asleep in the first 10-15 minutes she will not fall asleep at all.  She has had ear fullness and muffled hearing since march. She has a history of T-Tubes in her 68s but has had no problems since then. She wears wax ear plugs nightly as her husband snores. She denies ear pain, drainage from the ear, vertigo, or ringing in the ears. She has allergies and is taking Claritin for allergies/ post nasal drip nightly.  She is also concerned about  a new spot on her leg. It is brown, raised, and on her left calf. It does not itch or hurt she just found it incidentally.    Review of Systems  Constitutional:  Negative for chills and fever.  HENT:  Positive for congestion and hearing loss. Negative for ear discharge, ear pain and tinnitus.   Skin:  Negative for itching and rash.   Neurological:  Positive for headaches. Negative for dizziness, tingling and focal weakness.  Psychiatric/Behavioral:  The patient has insomnia.       Objective:     BP (!) 90/55 (BP Location: Left Arm, Patient Position: Sitting, Cuff Size: Small)   Pulse 77   Ht 5' (1.524 m)   Wt 52.2 kg   SpO2 100%   BMI 22.46 kg/m  {Vitals History (Optional):23777}  Physical Exam Constitutional:      General: She is not in acute distress.    Appearance: Normal appearance.  HENT:     Right Ear: Tympanic membrane and ear canal normal. There is no impacted cerumen.     Left Ear: Tympanic membrane and ear canal normal. There is no impacted cerumen.     Nose: Congestion present.  Cardiovascular:     Rate and Rhythm: Normal rate and regular rhythm.     Heart sounds: Normal heart sounds. No murmur heard. Pulmonary:     Effort: Pulmonary effort is normal.     Breath sounds: Normal breath sounds. No wheezing or rales.  Skin:    General: Skin is warm and dry.     Findings: No rash.  Neurological:     Mental Status: She is alert.      No results found for any visits on 01/03/24.  {Labs (Optional):23779}  The 10-year ASCVD risk score (Arnett DK, et al., 2019) is: 0.6%    Assessment &  Plan:   Problem List Items Addressed This Visit   None  Start using Nurtec as the first line abortive medication for migraine, then rizatriptan  if symptoms do not resolve in 72min-1hr  Medrol dose pack plus intranasal Flonase  for eustachian tube dysfunction. If this doesn't work consider using Claritin-D nightly. Will send referral to ENT if all of the above fail.  Continue to monitor seborrheic keratosis spot on left calf. Educated patient that this is a benign condition and to return for removal if it becomes large and bothersome.  Continue to monitor sleep, consider increase in Abilify  if no improvement or worsening sleep  No follow-ups on file.    Maylene Spear, Student-PA

## 2024-01-04 ENCOUNTER — Encounter: Payer: Self-pay | Admitting: Physician Assistant

## 2024-01-04 DIAGNOSIS — L821 Other seborrheic keratosis: Secondary | ICD-10-CM | POA: Insufficient documentation

## 2024-01-04 DIAGNOSIS — H6992 Unspecified Eustachian tube disorder, left ear: Secondary | ICD-10-CM | POA: Insufficient documentation

## 2024-01-04 DIAGNOSIS — I959 Hypotension, unspecified: Secondary | ICD-10-CM | POA: Insufficient documentation

## 2024-01-04 NOTE — Telephone Encounter (Signed)
 Patient has been speaking with provider via Mychart message.

## 2024-01-14 ENCOUNTER — Other Ambulatory Visit: Payer: Self-pay | Admitting: Physician Assistant

## 2024-01-14 DIAGNOSIS — R252 Cramp and spasm: Secondary | ICD-10-CM

## 2024-01-16 ENCOUNTER — Telehealth: Payer: Self-pay | Admitting: Neurology

## 2024-01-16 NOTE — Telephone Encounter (Signed)
 Pt called to cancel appt.

## 2024-01-16 NOTE — Telephone Encounter (Signed)
 Please advise on refill request

## 2024-01-20 ENCOUNTER — Ambulatory Visit: Admitting: Neurology

## 2024-01-30 MED ORDER — VALACYCLOVIR HCL 1 G PO TABS: 2000.0000 mg | ORAL_TABLET | Freq: Two times a day (BID) | ORAL | 0 refills | Status: DC

## 2024-01-30 NOTE — Addendum Note (Signed)
 Addended by: ANTONIETTE VERMELL CROME on: 01/30/2024 10:47 AM   Modules accepted: Orders

## 2024-01-31 ENCOUNTER — Ambulatory Visit: Payer: Self-pay

## 2024-01-31 ENCOUNTER — Ambulatory Visit: Admitting: Family Medicine

## 2024-01-31 NOTE — Telephone Encounter (Signed)
 FYI Only or Action Required?: FYI only for provider.  Patient was last seen in primary care on 01/03/2024 by Antoniette Vermell CROME, PA-C.  Called Nurse Triage reporting Back Pain.  Symptoms began several days ago.  Interventions attempted: OTC medications: tylenol .  Symptoms are: gradually worsening.  Triage Disposition: See Physician Within 24 Hours  Patient/caregiver understands and will follow disposition?: Yes         Copied from CRM [id number]. Topic: Clinical - Red Word Triage >> Jan 31, 2024  8:13 AM Alfonso ORN wrote: patient call back number [phone]   ----------------------------------------------------------------------- From previous Reason for Contact - Scheduling: Patient/patient representative is calling to schedule an appointment. Refer to attachments for appointment information. Reason for Disposition  Blood in urine (red, pink, or tea-colored)  Answer Assessment - Initial Assessment Questions 1. ONSET: When did the pain begin?      sunday 2. LOCATION: Where does it hurt? (upper, mid or lower back)     Right lower back 3. SEVERITY: How bad is the pain?  (e.g., Scale 1-10; mild, moderate, or severe)   - MILD (1-3): Doesn't interfere with normal activities.    - MODERATE (4-7): Interferes with normal activities or awakens from sleep.    - SEVERE (8-10): Excruciating pain, unable to do any normal activities.      mod 4. PATTERN: Is the pain constant? (e.g., yes, no; constant, intermittent)      constant 5. RADIATION: Does the pain shoot into your legs or somewhere else?     Right back to right side and flank 6. CAUSE:  What do you think is causing the back pain?      unknown 7. BACK OVERUSE:  Any recent lifting of heavy objects, strenuous work or exercise?     no 8. MEDICINES: What have you taken so far for the pain? (e.g., nothing, acetaminophen , NSAIDS)     tylenol  9. NEUROLOGIC SYMPTOMS: Do you have any weakness, numbness, or problems  with bowel/bladder control?     no 10. OTHER SYMPTOMS: Do you have any other symptoms? (e.g., fever, abdomen pain, burning with urination, blood in urine)       Orange colored urine  Protocols used: Back Pain-A-AH

## 2024-02-02 NOTE — Telephone Encounter (Signed)
 The patient was scheduled to see the provider on 02/01/24 and cancelled the appointment. Please advise, thanks.

## 2024-02-03 NOTE — Telephone Encounter (Signed)
 Call and see how she is doing and recommend UC or back in with us  if not resolving.

## 2024-02-07 NOTE — Telephone Encounter (Signed)
 The patient has been evaluated today at the [facility] in [place]. Visit notes are available in Care Everywhere.

## 2024-02-18 ENCOUNTER — Other Ambulatory Visit: Payer: Self-pay | Admitting: Physician Assistant

## 2024-02-18 DIAGNOSIS — G43811 Other migraine, intractable, with status migrainosus: Secondary | ICD-10-CM

## 2024-03-03 ENCOUNTER — Other Ambulatory Visit: Payer: Self-pay | Admitting: Physician Assistant

## 2024-03-03 DIAGNOSIS — F5101 Primary insomnia: Secondary | ICD-10-CM

## 2024-03-20 ENCOUNTER — Other Ambulatory Visit: Payer: Self-pay | Admitting: Physician Assistant

## 2024-03-20 DIAGNOSIS — G43811 Other migraine, intractable, with status migrainosus: Secondary | ICD-10-CM

## 2024-03-21 ENCOUNTER — Ambulatory Visit: Payer: Self-pay

## 2024-03-21 NOTE — Telephone Encounter (Signed)
 FYI Only or Action Required?: FYI only for provider.  Patient was last seen in primary care on 01/03/2024 by Antoniette Vermell CROME, PA-C.  Called Nurse Triage reporting Hypoglycemia.  Symptoms began several days ago.  Interventions attempted: Other: glucose tablets, eating lunch now.  Symptoms are: hypoglycemic episodes about 4 times daily/nightly with shakiness/sweating/dizziness completely resolved now; urinary urgency daily and nightly (states she gets up multiple times a night to urinate)  Triage Disposition: See Physician Within 24 Hours  Patient/caregiver understands and will follow disposition?: Yes                  Patient states she was able to retest just a few minutes ago and her blood sugar is 100, currently eating lunch. She adds that her blood sugar was dropping while on vacation as well and yesterday while driving. She is not diabetic and does not take insulin  or diabetes medications. She had gastric bypass surgery in the past and as a result has hypoglycemia. She states she only checks her blood sugar when she is symptomatic. 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 Patient states she is having urinary urgency, especially at night. She states it keeps her up at night. She states she gets the urgency during the day as well. She denies any blood in urine, fever, burning with urination, lower back or flank pain, nausea or vomiting, difficulty breathing. She states her symptoms from her hypoglycemic episode have all resolved. She states she may need a note from her PCP to allow her to take extra breaks for snacking. Reason for Disposition  [1] Blood glucose 70 mg/dL (3.9 mmol/L) or below, OR symptomatic AND [2] cause known  Urinating more frequently than usual (i.e., frequency) OR new-onset of the feeling of an urgent need to urinate  (i.e., urgency)  Protocols used: Diabetes - Low Blood Sugar-A-AH, Urinary Symptoms-A-AH

## 2024-03-21 NOTE — Telephone Encounter (Signed)
 Patient scheduled 03/22/2024 with Zada Palin, NP

## 2024-03-21 NOTE — Telephone Encounter (Signed)
 Unable to complete triage due to patient is at work and states she can be called back at 1:30pm.     Copied from CRM #[id number]. Topic: Clinical - Red Word Triage >> Mar 21, 2024 10:44 AM Carmell SAUNDERS wrote: Red Word that prompted transfer to Nurse Triage: Sugar dropping fast 60, gets shaky and dizzy. Happening 4 times a day. Also has bladder concerns. Left leg kerotosis? Keeps nicking this spot and concerned about that as well. Looking to come in for an appointment tomorrow. Answer Assessment - Initial Assessment Questions Patient states she had symptoms of hypoglycemia this morning (shaky, dizzy, sweating) and she checked her glucose and it was 61. She states she treated with glucose tablets and symptoms have improved. 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 During triage patient states her manager is looking at her like she needs to get off the phone and would like to schedule an appt. Patient states she can be called back at 1:30pm.   1. SYMPTOMS: What symptoms are you concerned about?     Shaky, dizzy, sweaty, blood glucose will drop rapidly  2. ONSET:  When did the symptoms start?     Since returning from vacation, past 4 days. She states this is happening multiple times a day. She states about 4 times a day and in the night as well.  3. BLOOD GLUCOSE: What is your blood glucose level?      61 at 10:20.   4. USUAL RANGE: What is your blood glucose level usually? (e.g., usual fasting morning value, usual evening value)     *No Answer*  5. TYPE 1 or 2:  Do you know what type of diabetes you have?  (e.g., Type 1, Type 2, Gestational; doesn't know)      *No Answer*  6. INSULIN : Do you take insulin ? What type of insulin (s) do you use? What is the mode of delivery? (syringe, pen; injection or pump) When did you last give yourself an insulin  dose? (i.e., time or hours/minutes ago) How much did you give? (i.e., how many units)     *No Answer*  7.  DIABETES PILLS: Do you take any pills for your diabetes? If Yes, ask: What is the name of the medicine(s) that you take for high blood sugar?     *No Answer*  8. OTHER SYMPTOMS: Do you have any symptoms? (e.g., fever, frequent urination, difficulty breathing, vomiting)     *No Answer*  9. LOW BLOOD GLUCOSE TREATMENT: What have you done so far to treat the low blood glucose level?     Glucose tablets.  10. FOOD: When did you last eat or drink?       She states she was previously eating 6 small meals a day but since being back at work she is not able to eat as frequently. She states she treated with glucose tablets.  11. ALONE: Are you alone right now or is someone with you?        At work.  12. PREGNANCY: Is there any chance you are pregnant? When was your last menstrual period?       N/A.  Protocols used: Diabetes - Low Blood Sugar-A-AH

## 2024-03-22 ENCOUNTER — Ambulatory Visit: Admitting: Medical-Surgical

## 2024-03-22 ENCOUNTER — Encounter: Payer: Self-pay | Admitting: Medical-Surgical

## 2024-03-22 VITALS — BP 99/61 | HR 79 | Resp 20 | Ht 60.0 in | Wt 120.1 lb

## 2024-03-22 DIAGNOSIS — E162 Hypoglycemia, unspecified: Secondary | ICD-10-CM

## 2024-03-22 DIAGNOSIS — L989 Disorder of the skin and subcutaneous tissue, unspecified: Secondary | ICD-10-CM

## 2024-03-22 DIAGNOSIS — N3281 Overactive bladder: Secondary | ICD-10-CM | POA: Diagnosis not present

## 2024-03-22 LAB — POCT URINALYSIS DIP (CLINITEK)
Bilirubin, UA: NEGATIVE
Blood, UA: NEGATIVE
Glucose, UA: NEGATIVE mg/dL
Ketones, POC UA: NEGATIVE mg/dL
Leukocytes, UA: NEGATIVE
Nitrite, UA: NEGATIVE
POC PROTEIN,UA: NEGATIVE
Spec Grav, UA: 1.015 (ref 1.010–1.025)
Urobilinogen, UA: 0.2 U/dL
pH, UA: 5.5 (ref 5.0–8.0)

## 2024-03-22 MED ORDER — OXYBUTYNIN CHLORIDE ER 5 MG PO TB24: 5.0000 mg | ORAL_TABLET | Freq: Every day | ORAL | 1 refills | Status: DC

## 2024-03-22 NOTE — Progress Notes (Signed)
 Established patient visit   History of Present Illness   Discussed the use of AI scribe software for clinical note transcription with the patient, who gave verbal consent to proceed.  History of Present Illness   Rachel Rich is a 53 year old female who presents with urinary urgency and frequent urination.  Urinary urgency, frequency, and nocturia - Urinary urgency and frequent urination occurring every couple of hours during the day and two to three times at night - No loss of urinary control - Symptoms present for about 3 years - Bladder sometimes feels full, other times does not - No unusual odor in urine - No abnormal vaginal odors or discharge - Kegel exercises have not improved symptoms - Nocturia occurring two to three times nightly - Nighttime urination disrupts sleep  Gynecologic and sexual history - History of hysterectomy many years ago - Not sexually active  Hypoglycemia - Over the past week, has had multiple symptomatic hypoglycemia symptoms - No recent medication changes - No dietary changes - No issues with this since right after her gastric bypass surgery years ago     Physical Exam   Physical Exam Vitals reviewed.  Constitutional:      General: She is not in acute distress.    Appearance: Normal appearance. She is not ill-appearing.  HENT:     Head: Normocephalic and atraumatic.  Cardiovascular:     Rate and Rhythm: Normal rate and regular rhythm.     Pulses: Normal pulses.     Heart sounds: Normal heart sounds. No murmur heard.    No friction rub. No gallop.  Pulmonary:     Effort: Pulmonary effort is normal. No respiratory distress.     Breath sounds: Normal breath sounds. No wheezing.  Skin:    General: Skin is warm and dry.     Findings: Lesion (irregular shaped mildly raised tan seborrheic keratosis to the lateral right lower leg) present.  Neurological:     Mental Status: She is alert and oriented to person, place, and time.   Psychiatric:        Mood and Affect: Mood normal.        Behavior: Behavior normal.        Thought Content: Thought content normal.        Judgment: Judgment normal.    Assessment & Plan   Assessment and Plan    Overactive bladder with nocturia and urinary frequency Chronic urinary frequency and nocturia, exacerbated by high fluid intake post-gastric bypass. No infection or abnormal urine findings. Kegel exercises ineffective. POCT UA normal.  - Sent wet prep to eval for BV/yeast. - Prescribe Ditropan  5 mg extended-release tablet at bedtime. - Monitor for side effects such as dry eye and dry mouth. - Follow up in four weeks to assess efficacy and side effects.  Hypoglycemia after gastric bypass Recent hypoglycemia episodes with glucose levels 55-60 mg/dL, despite dietary intake. Unclear etiology. - Provide continuous glucose monitor sensor for two weeks. - Instruct on using the app for glucose monitoring.  Benign skin lesion of leg Recurrent irritation and bleeding of benign skin lesion due to shaving. Opted for cryotherapy. - Perform cryotherapy on the skin lesion with three passes of liquid nitrogen.   Cryotherapy template Procedure: Cryodestruction of: leg lesion Consent obtained and verified. Time-out conducted. Noted no overlying erythema, induration, or other signs of local infection. Completed without difficulty using Cryo-Gun. Advised to call if fevers/chills, erythema, induration, drainage, or persistent bleeding.  Follow up   Return in about 4 weeks (around 04/19/2024) for OAB follow up. __________________________________ Zada FREDRIK Palin, DNP, APRN, FNP-BC Primary Care and Sports Medicine [facility] [place]

## 2024-03-23 ENCOUNTER — Ambulatory Visit: Payer: Self-pay | Admitting: Medical-Surgical

## 2024-03-23 LAB — WET PREP FOR TRICH, YEAST, CLUE
Clue Cell Exam: NEGATIVE
Trichomonas Exam: NEGATIVE
Yeast Exam: NEGATIVE

## 2024-03-23 LAB — SPECIMEN STATUS REPORT

## 2024-03-27 ENCOUNTER — Encounter: Payer: Self-pay | Admitting: Sports Medicine

## 2024-04-28 ENCOUNTER — Other Ambulatory Visit: Payer: Self-pay | Admitting: Physician Assistant

## 2024-04-28 DIAGNOSIS — R252 Cramp and spasm: Secondary | ICD-10-CM

## 2024-04-28 DIAGNOSIS — F5101 Primary insomnia: Secondary | ICD-10-CM

## 2024-05-13 ENCOUNTER — Other Ambulatory Visit: Payer: Self-pay | Admitting: Physician Assistant

## 2024-05-13 ENCOUNTER — Other Ambulatory Visit: Payer: Self-pay | Admitting: Medical-Surgical

## 2024-05-13 DIAGNOSIS — F5101 Primary insomnia: Secondary | ICD-10-CM

## 2024-05-13 DIAGNOSIS — G43811 Other migraine, intractable, with status migrainosus: Secondary | ICD-10-CM

## 2024-05-13 DIAGNOSIS — R252 Cramp and spasm: Secondary | ICD-10-CM

## 2024-05-16 ENCOUNTER — Other Ambulatory Visit: Payer: Self-pay | Admitting: Physician Assistant

## 2024-05-16 ENCOUNTER — Encounter: Payer: Self-pay | Admitting: Medical-Surgical

## 2024-05-16 DIAGNOSIS — F5101 Primary insomnia: Secondary | ICD-10-CM

## 2024-05-16 DIAGNOSIS — G43001 Migraine without aura, not intractable, with status migrainosus: Secondary | ICD-10-CM

## 2024-05-16 DIAGNOSIS — G2581 Restless legs syndrome: Secondary | ICD-10-CM

## 2024-05-16 DIAGNOSIS — G43811 Other migraine, intractable, with status migrainosus: Secondary | ICD-10-CM

## 2024-05-16 DIAGNOSIS — K219 Gastro-esophageal reflux disease without esophagitis: Secondary | ICD-10-CM

## 2024-05-17 MED ORDER — RIZATRIPTAN BENZOATE 10 MG PO TBDP: 10.0000 mg | ORAL_TABLET | ORAL | 5 refills | Status: DC | PRN

## 2024-05-17 MED ORDER — TRAZODONE HCL 100 MG PO TABS
200.0000 mg | ORAL_TABLET | Freq: Every evening | ORAL | 3 refills | Status: AC | PRN
Start: 2024-05-17 — End: ?

## 2024-05-17 MED ORDER — NURTEC 75 MG PO TBDP: 1.0000 | ORAL_TABLET | ORAL | 5 refills | Status: DC

## 2024-05-17 MED ORDER — PRAVASTATIN SODIUM 10 MG PO TABS: 10.0000 mg | ORAL_TABLET | Freq: Every day | ORAL | 3 refills | Status: DC

## 2024-05-17 MED ORDER — OMEPRAZOLE 40 MG PO CPDR: 40.0000 mg | DELAYED_RELEASE_CAPSULE | Freq: Every day | ORAL | 3 refills | Status: DC

## 2024-05-17 MED ORDER — ROPINIROLE HCL 1 MG PO TABS: 2.0000 mg | ORAL_TABLET | Freq: Every day | ORAL | 3 refills | Status: DC

## 2024-06-04 ENCOUNTER — Encounter: Payer: Self-pay | Admitting: Medical-Surgical

## 2024-06-04 ENCOUNTER — Encounter: Payer: Self-pay | Admitting: Physician Assistant

## 2024-06-04 NOTE — Telephone Encounter (Signed)
 Stable on Abilify  for bipolar disorder in full remission for 25 years and no changes to medication. Let patient know when ready.

## 2024-06-30 ENCOUNTER — Other Ambulatory Visit: Payer: Self-pay | Admitting: Physician Assistant

## 2024-06-30 DIAGNOSIS — R252 Cramp and spasm: Secondary | ICD-10-CM

## 2024-07-17 ENCOUNTER — Ambulatory Visit: Admitting: Physician Assistant

## 2024-07-28 ENCOUNTER — Other Ambulatory Visit: Payer: Self-pay | Admitting: Physician Assistant

## 2024-07-28 DIAGNOSIS — R252 Cramp and spasm: Secondary | ICD-10-CM

## 2024-08-03 ENCOUNTER — Ambulatory Visit: Admitting: Physician Assistant

## 2024-08-03 ENCOUNTER — Encounter: Payer: Self-pay | Admitting: Physician Assistant

## 2024-08-03 VITALS — BP 108/64 | HR 73 | Ht 60.0 in | Wt 117.0 lb

## 2024-08-03 DIAGNOSIS — Z9884 Bariatric surgery status: Secondary | ICD-10-CM

## 2024-08-03 DIAGNOSIS — Z23 Encounter for immunization: Secondary | ICD-10-CM | POA: Diagnosis not present

## 2024-08-03 DIAGNOSIS — R252 Cramp and spasm: Secondary | ICD-10-CM

## 2024-08-03 DIAGNOSIS — E782 Mixed hyperlipidemia: Secondary | ICD-10-CM

## 2024-08-03 DIAGNOSIS — K219 Gastro-esophageal reflux disease without esophagitis: Secondary | ICD-10-CM | POA: Insufficient documentation

## 2024-08-03 DIAGNOSIS — F5101 Primary insomnia: Secondary | ICD-10-CM | POA: Diagnosis not present

## 2024-08-03 DIAGNOSIS — R42 Dizziness and giddiness: Secondary | ICD-10-CM | POA: Diagnosis not present

## 2024-08-03 DIAGNOSIS — F3175 Bipolar disorder, in partial remission, most recent episode depressed: Secondary | ICD-10-CM

## 2024-08-03 DIAGNOSIS — N3281 Overactive bladder: Secondary | ICD-10-CM | POA: Diagnosis not present

## 2024-08-03 DIAGNOSIS — Z79899 Other long term (current) drug therapy: Secondary | ICD-10-CM

## 2024-08-03 DIAGNOSIS — Z131 Encounter for screening for diabetes mellitus: Secondary | ICD-10-CM

## 2024-08-03 DIAGNOSIS — G2581 Restless legs syndrome: Secondary | ICD-10-CM

## 2024-08-03 DIAGNOSIS — G43811 Other migraine, intractable, with status migrainosus: Secondary | ICD-10-CM

## 2024-08-03 DIAGNOSIS — Z13220 Encounter for screening for lipoid disorders: Secondary | ICD-10-CM

## 2024-08-03 DIAGNOSIS — G43001 Migraine without aura, not intractable, with status migrainosus: Secondary | ICD-10-CM

## 2024-08-03 MED ORDER — ZOLPIDEM TARTRATE 5 MG PO TABS: 5.0000 mg | ORAL_TABLET | Freq: Every evening | ORAL | 1 refills | Status: AC | PRN

## 2024-08-03 MED ORDER — CYCLOBENZAPRINE HCL 10 MG PO TABS: 10.0000 mg | ORAL_TABLET | Freq: Three times a day (TID) | ORAL | 1 refills | Status: AC | PRN

## 2024-08-03 MED ORDER — PRAVASTATIN SODIUM 10 MG PO TABS: 10.0000 mg | ORAL_TABLET | Freq: Every day | ORAL | 3 refills | Status: AC

## 2024-08-03 MED ORDER — ARIPIPRAZOLE 10 MG PO TABS: 10.0000 mg | ORAL_TABLET | Freq: Every day | ORAL | 3 refills | Status: AC

## 2024-08-03 MED ORDER — OMEPRAZOLE 40 MG PO CPDR: 40.0000 mg | DELAYED_RELEASE_CAPSULE | Freq: Every day | ORAL | 3 refills | Status: AC

## 2024-08-03 MED ORDER — ROPINIROLE HCL 1 MG PO TABS: 2.0000 mg | ORAL_TABLET | Freq: Every day | ORAL | 3 refills | Status: AC

## 2024-08-03 MED ORDER — NURTEC 75 MG PO TBDP: ORAL_TABLET | ORAL | 5 refills | Status: AC

## 2024-08-03 MED ORDER — OXYBUTYNIN CHLORIDE ER 5 MG PO TB24: 5.0000 mg | ORAL_TABLET | Freq: Every day | ORAL | 3 refills | Status: AC

## 2024-08-03 MED ORDER — PROMETHAZINE HCL 25 MG PO TABS: 25.0000 mg | ORAL_TABLET | Freq: Three times a day (TID) | ORAL | 0 refills | Status: DC | PRN

## 2024-08-03 MED ORDER — RIZATRIPTAN BENZOATE 10 MG PO TBDP: 10.0000 mg | ORAL_TABLET | ORAL | 5 refills | Status: AC | PRN

## 2024-08-03 NOTE — Progress Notes (Signed)
 "  Established Patient Office Visit  Subjective   Patient ID: Rachel Rich, female    DOB: Sep 23, 1970  Age: 54 y.o. MRN: [id number]  Chief Complaint  Patient presents with   Medical Management of Chronic Issues    HPI .Discussed the use of AI scribe software for clinical note transcription with the patient, who gave verbal consent to proceed.  History of Present Illness Rachel Rich is a 54 year old female who presents with worsening dizziness and ear congestion.  Dizziness and orthostatic symptoms - Worsening dizziness, especially when standing up or transitioning from lying position - No associated vertigo or spinning sensation  Ear congestion - Sensation of ear congestion described as 'clogged' feeling in the ear - No evaluation by otolaryngology due to scheduling conflicts  Allergic rhinitis and medication reactions - Daily use of over-the-counter antihistamines (Zyrtec or Claritin) for fluid and nasal drip - Allergic reaction to Flonase  with swelling and difficulty breathing, resulting in discontinuation  Restless leg syndrome - Takes Requip  2 mg at bedtime - Medication less effective than previously - No daytime symptoms  Migraine headaches - Awaiting insurance approval for Qulipta as a preventative - Uses Nurtec for acute rescue, but has not used it recently - No migraines for approximately one week, coinciding with running out of Qulipta  Sleep disturbance and fatigue - Difficulty falling and staying asleep - Feels exhausted by early afternoon - Attempts to improve sleep hygiene by avoiding phone use at night, with slight improvement  Urinary frequency - Drinks nearly a gallon of water daily - Frequent urination attributed to high fluid intake - No bladder leakage  Lower extremity edema - Stands all day at work (bank) - Leg swelling by end of day, managed with compression stockings  Relevant surgical and medical history - History of bariatric  surgery: omega loop procedure converted to Roux-en-Y gastric bypass - History of thyroid  nodule - Recent esophageal dilation on July 16, 2024    ROS See HPI.    Objective:     BP 108/64   Pulse 73   Ht 5' (1.524 m)   Wt 117 lb (53.1 kg)   SpO2 99%   BMI 22.85 kg/m  BP Readings from Last 3 Encounters:  08/03/24 108/64  03/22/24 99/61  01/03/24 (!) 90/55   Wt Readings from Last 3 Encounters:  08/03/24 117 lb (53.1 kg)  03/22/24 120 lb 1.3 oz (54.5 kg)  01/03/24 115 lb (52.2 kg)      Physical Exam Constitutional:      Appearance: Normal appearance.  HENT:     Head: Normocephalic.     Right Ear: Tympanic membrane, ear canal and external ear normal. There is no impacted cerumen.     Left Ear: Tympanic membrane, ear canal and external ear normal. There is no impacted cerumen.     Nose: Nose normal.     Mouth/Throat:     Mouth: Mucous membranes are moist.     Pharynx: No oropharyngeal exudate or posterior oropharyngeal erythema.  Eyes:     Extraocular Movements: Extraocular movements intact.     Pupils: Pupils are equal, round, and reactive to light.  Cardiovascular:     Rate and Rhythm: Normal rate and regular rhythm.  Pulmonary:     Effort: Pulmonary effort is normal.     Breath sounds: Normal breath sounds.  Musculoskeletal:     Cervical back: Normal range of motion and neck supple.  Neurological:     General: No focal deficit present.  Mental Status: She is alert and oriented to person, place, and time.     Comments: Rachel Rich: Pt reported dizziness to the right with no nystagmus.   Psychiatric:        Mood and Affect: Mood normal.        The 10-year ASCVD risk score (Arnett DK, et al., 2019) is: 1%    Assessment & Plan:  .Rachel Rich was seen today for medical management of chronic issues.  Diagnoses and all orders for this visit:  Primary insomnia -     zolpidem  (AMBIEN ) 5 MG tablet; Take 1 tablet (5 mg total) by mouth at bedtime as needed  for sleep.  RLS (restless legs syndrome) -     rOPINIRole  (REQUIP ) 1 MG tablet; Take 2 tablets (2 mg total) by mouth at bedtime.  Migraine without aura and with status migrainosus, not intractable -     rizatriptan  (MAXALT -MLT) 10 MG disintegrating tablet; Take 1 tablet (10 mg total) by mouth as needed for migraine. May repeat in 2 hours if needed  Overactive bladder -     oxybutynin  (DITROPAN -XL) 5 MG 24 hr tablet; Take 1 tablet (5 mg total) by mouth daily.  Bipolar disorder, in partial remission, most recent episode depressed (HCC) -     ARIPiprazole  (ABILIFY ) 10 MG tablet; Take 1 tablet (10 mg total) by mouth daily.  Muscle cramps at night -     cyclobenzaprine  (FLEXERIL ) 10 MG tablet; Take 1 tablet (10 mg total) by mouth 3 (three) times daily as needed for muscle spasms.  Gastroesophageal reflux disease without esophagitis -     omeprazole  (PRILOSEC) 40 MG capsule; Take 1 capsule (40 mg total) by mouth daily.  Other migraine with status migrainosus, intractable -     promethazine  (PHENERGAN ) 25 MG tablet; Take 1 tablet (25 mg total) by mouth every 8 (eight) hours as needed for nausea or vomiting. -     Rimegepant Sulfate (NURTEC) 75 MG TBDP; Take one tablet as needed for migraine rescue. No more than one tablet daily.  Dizziness  Need for influenza vaccination -     Flu vaccine trivalent PF, 6mos and older(Flulaval,Afluria,Fluarix,Fluzone)  Need for COVID-19 vaccine -     Pfizer Comirnaty Covid-19 Vaccine 57yrs & older  History of Roux-en-Y gastric bypass -     CMP14+EGFR -     Lipid panel -     TSH + free T4 -     B12 and Folate Panel -     Fe+TIBC+Fer -     VITAMIN D  25 Hydroxy (Vit-D Deficiency, Fractures) -     CBC w/Diff/Platelet -     Magnesium -     Vitamin B1 -     Zinc  -     Vitamin A   Medication management -     CMP14+EGFR -     Lipid panel -     TSH + free T4 -     B12 and Folate Panel -     Fe+TIBC+Fer -     VITAMIN D  25 Hydroxy (Vit-D Deficiency,  Fractures) -     CBC w/Diff/Platelet -     Magnesium -     Vitamin B1 -     Zinc  -     Vitamin A   Screening for lipid disorders -     Lipid panel  Screening for diabetes mellitus -     CMP14+EGFR  Mixed hyperlipidemia -     pravastatin  (PRAVACHOL ) 10 MG tablet; Take 1 tablet (  10 mg total) by mouth daily. TAKE 1 TABLET (10 MG TOTAL) BY MOUTH DAILY.   Assessment & Plan Eustachian tube dysfunction, left Persistent ear clogging likely due to fluid. No systemic hypotension or significant nasal drip. Allergic to Flonase . ENT appointment in February. - Provided handout on home Epley maneuvers. - Recommended Claritin D, Allegra D, or Zyrtec D. - Continue with ENT appointment in February. - Ordered labs for health assessment.  Dizziness - Likely assoicated with ETD - orthostatic BP normal - HO for epley manuevers givent - hopefully the D added to allergy medication will be helpful  Migraine without aura and with status migrainosus Migraines controlled with Qulipta and Nurtec. No recent migraines. Awaiting Qulipta refill preauthorization. - Continue Qulipta for prevention. - Use Nurtec as needed. - Await Qulipta refill preauthorization.  Restless legs syndrome Requip  effective at night. No daytime symptoms. - Continue Requip  2 mg at bedtime.  Primary insomnia Difficulty sleeping due to nocturnal urination and phone use. Fatigue by afternoon. - Continue sleep hygiene, keep phone out of bedroom. - Stop Trazodone  and Hydroxyzine  since not effective.  - Start ambien  5mg  at bedtime.   Overactive bladder Frequent nocturnal urination despite medication. High fluid intake. - Continue on oxybutynin  for leakage.   General Health Maintenance Flu shot requested and appropriate. - Administered flu and covid vaccine today.  - Labs ordered to evaluate vitamins s/p gastric bypass surgery.      Return in about 6 weeks (around 09/14/2024) for sleep.    Merrin Mcvicker, PA-C  "

## 2024-08-03 NOTE — Patient Instructions (Addendum)
 Stop trazadone. Start ambien  at bedtime for sleep.  Epley manuevers Start D with allergy medication Keep ENT appt.

## 2024-08-07 ENCOUNTER — Ambulatory Visit: Payer: Self-pay | Admitting: Physician Assistant

## 2024-08-07 DIAGNOSIS — R790 Abnormal level of blood mineral: Secondary | ICD-10-CM | POA: Insufficient documentation

## 2024-08-07 DIAGNOSIS — E559 Vitamin D deficiency, unspecified: Secondary | ICD-10-CM

## 2024-08-07 NOTE — Progress Notes (Signed)
 Rachel Rich,   Your vitamin D  is very low! What dose of vitamin D  and how are you taking it?   Your are anemic. Your iron stores and saturation is low. Can you tolerate oral iron? If not we need to consider iron transfusion with hematology.   Cholesterol looks great.   Thyroid  normal.   B12 and folate normal.   Magnesium to goal.   Kidney and glucose look great.

## 2024-08-09 LAB — IRON,TIBC AND FERRITIN PANEL
Ferritin: 10 ng/mL — ABNORMAL LOW (ref 15–150)
Iron Saturation: 9 % — CL (ref 15–55)
Iron: 41 ug/dL (ref 27–159)
Total Iron Binding Capacity: 439 ug/dL (ref 250–450)
UIBC: 398 ug/dL (ref 131–425)

## 2024-08-09 LAB — B12 AND FOLATE PANEL
Folate: 14.9 ng/mL
Vitamin B-12: 481 pg/mL (ref 232–1245)

## 2024-08-09 LAB — CMP14+EGFR
ALT: 47 IU/L — ABNORMAL HIGH (ref 0–32)
AST: 33 IU/L (ref 0–40)
Albumin: 4.2 g/dL (ref 3.8–4.9)
Alkaline Phosphatase: 140 IU/L — ABNORMAL HIGH (ref 49–135)
BUN/Creatinine Ratio: 16 (ref 9–23)
BUN: 11 mg/dL (ref 6–24)
Bilirubin Total: 0.3 mg/dL (ref 0.0–1.2)
CO2: 24 mmol/L (ref 20–29)
Calcium: 8.9 mg/dL (ref 8.7–10.2)
Chloride: 103 mmol/L (ref 96–106)
Creatinine, Ser: 0.68 mg/dL (ref 0.57–1.00)
Globulin, Total: 2 g/dL (ref 1.5–4.5)
Glucose: 83 mg/dL (ref 70–99)
Potassium: 4.9 mmol/L (ref 3.5–5.2)
Sodium: 140 mmol/L (ref 134–144)
Total Protein: 6.2 g/dL (ref 6.0–8.5)
eGFR: 103 mL/min/1.73

## 2024-08-09 LAB — MAGNESIUM: Magnesium: 2.1 mg/dL (ref 1.6–2.3)

## 2024-08-09 LAB — CBC WITH DIFFERENTIAL/PLATELET
Basophils Absolute: 0 x10E3/uL (ref 0.0–0.2)
Basos: 1 %
EOS (ABSOLUTE): 0.2 x10E3/uL (ref 0.0–0.4)
Eos: 3 %
Hematocrit: 35.7 % (ref 34.0–46.6)
Hemoglobin: 10.7 g/dL — ABNORMAL LOW (ref 11.1–15.9)
Immature Grans (Abs): 0 x10E3/uL (ref 0.0–0.1)
Immature Granulocytes: 0 %
Lymphocytes Absolute: 1.5 x10E3/uL (ref 0.7–3.1)
Lymphs: 32 %
MCH: 25.1 pg — ABNORMAL LOW (ref 26.6–33.0)
MCHC: 30 g/dL — ABNORMAL LOW (ref 31.5–35.7)
MCV: 84 fL (ref 79–97)
Monocytes Absolute: 0.3 x10E3/uL (ref 0.1–0.9)
Monocytes: 6 %
Neutrophils Absolute: 2.8 x10E3/uL (ref 1.4–7.0)
Neutrophils: 58 %
Platelets: 265 x10E3/uL (ref 150–450)
RBC: 4.27 x10E6/uL (ref 3.77–5.28)
RDW: 14.6 % (ref 11.7–15.4)
WBC: 4.8 x10E3/uL (ref 3.4–10.8)

## 2024-08-09 LAB — TSH+FREE T4
Free T4: 1.04 ng/dL (ref 0.82–1.77)
TSH: 0.648 u[IU]/mL (ref 0.450–4.500)

## 2024-08-09 LAB — VITAMIN D 25 HYDROXY (VIT D DEFICIENCY, FRACTURES): Vit D, 25-Hydroxy: 11.5 ng/mL — ABNORMAL LOW (ref 30.0–100.0)

## 2024-08-09 LAB — LIPID PANEL
Chol/HDL Ratio: 2.8 ratio (ref 0.0–4.4)
Cholesterol, Total: 155 mg/dL (ref 100–199)
HDL: 55 mg/dL
LDL Chol Calc (NIH): 87 mg/dL (ref 0–99)
Triglycerides: 63 mg/dL (ref 0–149)
VLDL Cholesterol Cal: 13 mg/dL (ref 5–40)

## 2024-08-09 LAB — VITAMIN A: Vitamin A: 36.9 ug/dL (ref 20.1–62.0)

## 2024-08-09 LAB — ZINC: Zinc: 91 ug/dL (ref 44–115)

## 2024-08-09 LAB — VITAMIN B1: Thiamine: 99.2 nmol/L (ref 66.5–200.0)

## 2024-08-11 ENCOUNTER — Other Ambulatory Visit: Payer: Self-pay | Admitting: Physician Assistant

## 2024-08-11 DIAGNOSIS — G43811 Other migraine, intractable, with status migrainosus: Secondary | ICD-10-CM

## 2024-08-13 ENCOUNTER — Telehealth: Payer: Self-pay | Admitting: Physician Assistant

## 2024-08-13 NOTE — Telephone Encounter (Signed)
 Copied from CRM [id number]. Topic: Clinical - Medication Question >> Aug 13, 2024 10:13 AM [name] wrote: Reason for CRM: Patient is calling to ask advice as she is taking OTC [place] Iron supplement. Patient report that every time that she takes it that she gets a headache. Patient wanting to know is there a prescription medication that could support her with her headaches? Please advise

## 2024-08-13 NOTE — Telephone Encounter (Signed)
 See MyChart message.

## 2024-08-15 MED ORDER — FUSION PLUS PO CAPS: 1.0000 | ORAL_CAPSULE | Freq: Every day | ORAL | 11 refills | Status: AC

## 2024-08-15 NOTE — Addendum Note (Signed)
 Addended by: ANTONIETTE VERMELL CROME on: 08/15/2024 03:45 PM   Modules accepted: Orders

## 2024-08-23 ENCOUNTER — Other Ambulatory Visit: Payer: Self-pay | Admitting: Physician Assistant

## 2024-08-23 DIAGNOSIS — H6992 Unspecified Eustachian tube disorder, left ear: Secondary | ICD-10-CM

## 2024-08-29 ENCOUNTER — Other Ambulatory Visit: Payer: Self-pay | Admitting: Physician Assistant

## 2024-08-29 DIAGNOSIS — G43811 Other migraine, intractable, with status migrainosus: Secondary | ICD-10-CM

## 2024-09-17 ENCOUNTER — Ambulatory Visit: Admitting: Physician Assistant
